# Patient Record
Sex: Male | Born: 1945 | Race: White | Hispanic: No | Marital: Single | Smoking: Former smoker
Health system: Southern US, Community
[De-identification: ages and names within clinical notes are randomized; demographics above are authoritative.]

---
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
## 2008-01-06 ENCOUNTER — Ambulatory Visit: Payer: Self-pay | Admitting: Cardiology

## 2008-01-06 ENCOUNTER — Inpatient Hospital Stay (HOSPITAL_COMMUNITY): Admission: EM | Admit: 2008-01-06 | Discharge: 2008-01-08 | Payer: Self-pay | Admitting: Emergency Medicine

## 2008-01-08 ENCOUNTER — Encounter (INDEPENDENT_AMBULATORY_CARE_PROVIDER_SITE_OTHER): Payer: Self-pay | Admitting: *Deleted

## 2010-03-13 ENCOUNTER — Encounter: Payer: Self-pay | Admitting: *Deleted

## 2010-07-05 NOTE — H&P (Signed)
NAMEPIPER, Wayne Griffin NO.:  [id number]   MEDICAL RECORD NO.:  [id number]          PATIENT TYPE:  INP   LOCATION:  1825                         FACILITY:  [facility]   PHYSICIAN:  Lonia Blood, M.D.       DATE OF BIRTH:  Nov 30, 1945   DATE OF ADMISSION:  01/06/2008  DATE OF DISCHARGE:                              HISTORY & PHYSICAL   PRIMARY CARE PHYSICIAN:  This patient does not have a primary care  physician.   CHIEF COMPLAINT:  Left side pain.   HISTORY OF PRESENT ILLNESS:  Wayne Griffin is 65 year old gentleman  without any past medical history who presents today to the emergency  room after had sudden onset left-sided chest pain that started while he  was doing the dishes.  The patient arrived to emergency room had some  relief but then slamming the left.  Flank dull pain.  He says the chest  and subsided completed by now part of this left flank pain is still  there present right now is a speak.   PAST MEDICAL HISTORY:  Nephrolithiasis appendectomy and osteoarthritis.   SOCIAL HISTORY:  No cigarettes.  The patient quit in February 2002.  No  alcohol and no cocaine.  He lives with his son.   FAMILY HISTORY:  Mother died with colon cancer.  Father died with  dementia.  He has got 3 brothers and 1 sister with obesity, stroke,  hypertension, and diabetes.   HOME MEDICATIONS:  None.   REVIEW OF SYSTEMS:  As per HPI.  All other systems reviewed and  negative.   PHYSICAL EXAMINATION UPON ADMISSION:  VITAL SIGNS:  Temperature 98.2,  pulse 65, respirations 12, and blood pressure 109/59.  GENERAL APPEARANCE:  One of the mildly obese gentleman, in no acute  distress, lying on the stretcher alert and orient to place, person, and  time.  HEAD:  Normocephalic and atraumatic.  EYES:  Pupils equal, round, and reactive to light and accommodation.  Extraocular movements intact.  ENT:  Throat clear.  NECK:  Supple.  No JVD.  CHEST:  Clear to auscultation without wheezing,  rhonchi, or crackles.  HEART:  Regular rate and rhythm without murmurs, rubs, or gallops.  ABDOMEN:  Obese, soft, and nontender.  Bowel sounds are present.  EXTREMITIES:  Lower extremities without edema.  SKIN:  On the left forearm, there is a patch of erythema.   LABORATORY VALUES ON ADMISSION:  White blood cell count is 11.4,  hemoglobin 14.6, and platelet count 165.  Sodium 138, potassium 3.8,  chloride 107, BUN 19, creatinine 1.2, and glucose 232.  CT scan of chest  does not indicate any pulmonary emboli and no parenchymal findings.  CT  scan of abdomen indicates presence of left-sided adrenal hematoma and  questionable adrenal mass.   ASSESSMENT AND PLAN:  1. This is a 65 year old gentleman presenting with chest pain.  He has      got risk factors for coronary artery disease including sex, age,      and presumed diabetes.  I will place the patient on telemetry, rule  him out for myocardial infarction with recent cardiac enzymes.  I      will consider a cardiac stress test in the morning.  2. Adrenal hematoma, unclear etiology.  There is no reported trauma.      Concern about possible malignancy, primary malignancy, or secondary      malignancy.  I think we are going to run coagulation studies and      then consider MRI of the abdomen.  3. Hyperglycemia.  We will check a hemoglobin A1c and based on the      results, we will counsel for possible diabetes.      Lonia Blood, M.D.  Electronically Signed     SL/MEDQ  D:  01/06/2008  T:  01/06/2008  Job:  [id number]

## 2010-07-08 NOTE — Discharge Summary (Signed)
NAMETANNAR, BROKER                ACCOUNT NO.:  [id number]   MEDICAL RECORD NO.:  [id number]          PATIENT TYPE:  INP   LOCATION:  4712                         FACILITY:  [facility]   PHYSICIAN:  Michelene Gardener, MD    DATE OF BIRTH:  10-30-1945   DATE OF ADMISSION:  01/06/2008  DATE OF DISCHARGE:  01/08/2008                               DISCHARGE SUMMARY   DISCHARGE DIAGNOSES:  1. Noncardiac chest pain.  2. Adrenal hematoma, unclear etiology, questionable for malignant      process.  3. Hyperglycemia.  4. Obesity.  5. Osteoarthritis.   DISCHARGE MEDICATIONS:  None.   PROCEDURES:  None.   CONSULTATIONS:  None.   RADIOLOGY STUDIES:  1. Chest x-ray on January 06, 2008 showed some atelectasis.  2. Abdominal x-ray on January 06, 2008 showed nonspecific gas      pattern.  3. CT angio of the abdomen on January 06, 2008 showed left      retroperitoneal hematoma without active extravasation, which seems      to be an enlarged left adrenal gland.  There is nontraumatic      adrenal hemorrhage.  4. CT scan of the chest showed no aortic dissection and showed 8-mm      nodular opacity in the lingula that looks like scar.  5. CT scan of the pelvis showed no acute findings.  Follow up with      primary doctor within 1-2 weeks.  Pending test MRI that was      scheduled for January 15, 2008 to be done as an outpatient.   COURSE OF HOSPITALIZATION:  1. Chest pain.  This patient's pain is most likely cardiac and it is      tender on palpation.  The patient was admitted to the hospital for      risk stratification.  Three sets of troponin and cardiac enzymes      were done and they came to be negative.  EKG was done and that can      be normal.  The patient's chest pain totally resolved.  I advised      him to follow with his primary doctor for outpatient evaluation for      possible stress test, also advised him to come back to the ER if he      developed more chest pain or increasing  shortness of breath.  2. Adrenal hemorrhage.  His CT scan of the abdomen showed enlargement      of the left adrenal with possible hemorrhage and MRI was      recommended.  MRI was tried in the hospital, but was not able to do      it because of his large size.  We tried to do an open MRI, but      nothing has been available up to January 15, 2008.  The patient      already scheduled for January 15, 2008 to undergo the MRI.  I have      prolonged discussion with him, explained the condition to him and  explained the importance of keeping that MRI and then following      with his primary doctor for further evaluation.  3. Hyperglycemia that has resolved.  Hemoglobin A1c was done and it      was 6.1.  No need for medications.  4. Morbid obesity.  The patient was advised about the risks.   Total assessment time is 40 minutes.      Michelene Gardener, MD  Electronically Signed     NAE/MEDQ  D:  01/10/2008  T:  01/11/2008  Job:  [id number]

## 2010-11-23 LAB — COMPREHENSIVE METABOLIC PANEL
ALT: 25
AST: 20
Alkaline Phosphatase: 43
BUN: 19
GFR calc Af Amer: >60
GFR calc non Af Amer: >60
Glucose, Bld: 232 — ABNORMAL HIGH
Potassium: 3.8
Total Protein: 6.5

## 2010-11-23 LAB — DIFFERENTIAL
Basophils Relative: 1
Eosinophils Absolute: 0.3
Eosinophils Relative: 2
Lymphocytes Relative: 38
Lymphs Abs: 4.3 — ABNORMAL HIGH
Monocytes Absolute: 0.9
Monocytes Relative: 8
Neutro Abs: 5.8

## 2010-11-23 LAB — CBC
HCT: 37.6 — ABNORMAL LOW
HCT: 39.2
Hemoglobin: 13.4
MCHC: 35
MCV: 84.1
MCV: 85.8
Platelets: 115 — ABNORMAL LOW
Platelets: 130 — ABNORMAL LOW
RDW: 13.6
RDW: 13.7

## 2010-11-23 LAB — BASIC METABOLIC PANEL
BUN: 14
BUN: 16
Calcium: 8.7
Calcium: 8.8
Creatinine, Ser: 1.04
GFR calc non Af Amer: >60
GFR calc non Af Amer: >60
Glucose, Bld: 158 — ABNORMAL HIGH
Potassium: 3.9

## 2010-11-23 LAB — URINALYSIS, ROUTINE W REFLEX MICROSCOPIC
Glucose, UA: NEGATIVE
Specific Gravity, Urine: 1.011
Urobilinogen, UA: 0.2

## 2010-11-23 LAB — CARDIAC PANEL(CRET KIN+CKTOT+MB+TROPI)
CK, MB: 1
CK, MB: 1.1
CK, MB: 1.1
CK, MB: 1.1
CK, MB: 1.1
Relative Index: 0.9
Relative Index: INVALID
Relative Index: INVALID
Total CK: 123
Total CK: 45
Total CK: 53
Total CK: 75
Troponin I: 0.01
Troponin I: 0.01
Troponin I: <0.01
Troponin I: <0.01

## 2010-11-23 LAB — HEMOGLOBIN A1C: Mean Plasma Glucose: 128

## 2010-11-23 LAB — LIPASE, BLOOD: Lipase: 34

## 2010-11-23 LAB — PROTIME-INR: Prothrombin Time: 12.6

## 2010-11-23 LAB — CORTISOL: Cortisol, Plasma: 18.3

## 2010-11-23 LAB — CK TOTAL AND CKMB (NOT AT ARMC): Total CK: 75

## 2010-11-23 LAB — D-DIMER, QUANTITATIVE
D-Dimer, Quant: 0.51 — ABNORMAL HIGH
D-Dimer, Quant: 1.35 — ABNORMAL HIGH

## 2010-11-23 LAB — LIPID PANEL: VLDL: 49 — ABNORMAL HIGH

## 2010-11-23 LAB — APTT: aPTT: 29

## 2011-02-20 DIAGNOSIS — R0602 Shortness of breath: Secondary | ICD-10-CM

## 2018-01-24 ENCOUNTER — Inpatient Hospital Stay (HOSPITAL_COMMUNITY)
Admission: EM | Admit: 2018-01-24 | Discharge: 2018-01-28 | DRG: 062 | Disposition: A | Discharge status: Rehabilitation Facility | Payer: Medicare Other | Attending: Neurology | Admitting: Neurology

## 2018-01-24 ENCOUNTER — Encounter (HOSPITAL_COMMUNITY): Payer: Self-pay | Admitting: Neurology

## 2018-01-24 ENCOUNTER — Emergency Department (HOSPITAL_COMMUNITY): Payer: Medicare Other

## 2018-01-24 ENCOUNTER — Other Ambulatory Visit: Payer: Self-pay

## 2018-01-24 DIAGNOSIS — E669 Obesity, unspecified: Secondary | ICD-10-CM | POA: Diagnosis present

## 2018-01-24 DIAGNOSIS — M79605 Pain in left leg: Secondary | ICD-10-CM | POA: Diagnosis present

## 2018-01-24 DIAGNOSIS — I69354 Hemiplegia and hemiparesis following cerebral infarction affecting left non-dominant side: Secondary | ICD-10-CM | POA: Diagnosis not present

## 2018-01-24 DIAGNOSIS — I361 Nonrheumatic tricuspid (valve) insufficiency: Secondary | ICD-10-CM | POA: Diagnosis not present

## 2018-01-24 DIAGNOSIS — I6522 Occlusion and stenosis of left carotid artery: Secondary | ICD-10-CM | POA: Diagnosis present

## 2018-01-24 DIAGNOSIS — I739 Peripheral vascular disease, unspecified: Secondary | ICD-10-CM | POA: Diagnosis present

## 2018-01-24 DIAGNOSIS — I878 Other specified disorders of veins: Secondary | ICD-10-CM | POA: Diagnosis present

## 2018-01-24 DIAGNOSIS — N182 Chronic kidney disease, stage 2 (mild): Secondary | ICD-10-CM | POA: Diagnosis present

## 2018-01-24 DIAGNOSIS — E785 Hyperlipidemia, unspecified: Secondary | ICD-10-CM | POA: Diagnosis present

## 2018-01-24 DIAGNOSIS — I639 Cerebral infarction, unspecified: Secondary | ICD-10-CM | POA: Diagnosis present

## 2018-01-24 DIAGNOSIS — R29708 NIHSS score 8: Secondary | ICD-10-CM | POA: Diagnosis present

## 2018-01-24 DIAGNOSIS — Z79899 Other long term (current) drug therapy: Secondary | ICD-10-CM | POA: Diagnosis not present

## 2018-01-24 DIAGNOSIS — I672 Cerebral atherosclerosis: Secondary | ICD-10-CM | POA: Diagnosis present

## 2018-01-24 DIAGNOSIS — G8194 Hemiplegia, unspecified affecting left nondominant side: Secondary | ICD-10-CM | POA: Diagnosis present

## 2018-01-24 DIAGNOSIS — I700 Atherosclerosis of aorta: Secondary | ICD-10-CM | POA: Diagnosis present

## 2018-01-24 DIAGNOSIS — M1120 Other chondrocalcinosis, unspecified site: Secondary | ICD-10-CM | POA: Diagnosis present

## 2018-01-24 DIAGNOSIS — Z23 Encounter for immunization: Secondary | ICD-10-CM | POA: Diagnosis present

## 2018-01-24 DIAGNOSIS — M25552 Pain in left hip: Secondary | ICD-10-CM | POA: Diagnosis not present

## 2018-01-24 DIAGNOSIS — Z823 Family history of stroke: Secondary | ICD-10-CM | POA: Diagnosis not present

## 2018-01-24 DIAGNOSIS — R609 Edema, unspecified: Secondary | ICD-10-CM

## 2018-01-24 DIAGNOSIS — Z87891 Personal history of nicotine dependence: Secondary | ICD-10-CM

## 2018-01-24 DIAGNOSIS — D72829 Elevated white blood cell count, unspecified: Secondary | ICD-10-CM | POA: Diagnosis not present

## 2018-01-24 DIAGNOSIS — D62 Acute posthemorrhagic anemia: Secondary | ICD-10-CM | POA: Diagnosis not present

## 2018-01-24 DIAGNOSIS — Z7982 Long term (current) use of aspirin: Secondary | ICD-10-CM

## 2018-01-24 DIAGNOSIS — R296 Repeated falls: Secondary | ICD-10-CM | POA: Diagnosis present

## 2018-01-24 DIAGNOSIS — R0989 Other specified symptoms and signs involving the circulatory and respiratory systems: Secondary | ICD-10-CM | POA: Diagnosis not present

## 2018-01-24 DIAGNOSIS — N179 Acute kidney failure, unspecified: Secondary | ICD-10-CM | POA: Diagnosis not present

## 2018-01-24 DIAGNOSIS — Z8249 Family history of ischemic heart disease and other diseases of the circulatory system: Secondary | ICD-10-CM | POA: Diagnosis not present

## 2018-01-24 DIAGNOSIS — F419 Anxiety disorder, unspecified: Secondary | ICD-10-CM | POA: Diagnosis not present

## 2018-01-24 DIAGNOSIS — I370 Nonrheumatic pulmonary valve stenosis: Secondary | ICD-10-CM | POA: Diagnosis not present

## 2018-01-24 DIAGNOSIS — W19XXXA Unspecified fall, initial encounter: Secondary | ICD-10-CM | POA: Diagnosis not present

## 2018-01-24 DIAGNOSIS — I10 Essential (primary) hypertension: Secondary | ICD-10-CM | POA: Diagnosis not present

## 2018-01-24 DIAGNOSIS — M1732 Unilateral post-traumatic osteoarthritis, left knee: Secondary | ICD-10-CM | POA: Diagnosis not present

## 2018-01-24 DIAGNOSIS — I6300 Cerebral infarction due to thrombosis of unspecified precerebral artery: Secondary | ICD-10-CM | POA: Diagnosis not present

## 2018-01-24 DIAGNOSIS — Z7902 Long term (current) use of antithrombotics/antiplatelets: Secondary | ICD-10-CM

## 2018-01-24 DIAGNOSIS — R269 Unspecified abnormalities of gait and mobility: Secondary | ICD-10-CM | POA: Diagnosis not present

## 2018-01-24 DIAGNOSIS — I161 Hypertensive emergency: Secondary | ICD-10-CM | POA: Diagnosis present

## 2018-01-24 DIAGNOSIS — I129 Hypertensive chronic kidney disease with stage 1 through stage 4 chronic kidney disease, or unspecified chronic kidney disease: Secondary | ICD-10-CM | POA: Diagnosis present

## 2018-01-24 DIAGNOSIS — R29810 Facial weakness: Secondary | ICD-10-CM | POA: Diagnosis present

## 2018-01-24 DIAGNOSIS — R7989 Other specified abnormal findings of blood chemistry: Secondary | ICD-10-CM | POA: Diagnosis not present

## 2018-01-24 DIAGNOSIS — M47812 Spondylosis without myelopathy or radiculopathy, cervical region: Secondary | ICD-10-CM | POA: Diagnosis present

## 2018-01-24 DIAGNOSIS — I69359 Hemiplegia and hemiparesis following cerebral infarction affecting unspecified side: Secondary | ICD-10-CM | POA: Diagnosis not present

## 2018-01-24 DIAGNOSIS — I69398 Other sequelae of cerebral infarction: Secondary | ICD-10-CM | POA: Diagnosis not present

## 2018-01-24 LAB — DIFFERENTIAL
Abs Immature Granulocytes: 0.08 10*3/uL — ABNORMAL HIGH (ref 0.00–0.07)
Basophils Absolute: 0 10*3/uL (ref 0.0–0.1)
Basophils Relative: 0 %
Eosinophils Absolute: 0 10*3/uL (ref 0.0–0.5)
Eosinophils Relative: 0 %
Immature Granulocytes: 1 %
LYMPHS PCT: 5 %
Lymphs Abs: 0.8 10*3/uL (ref 0.7–4.0)
Monocytes Absolute: 1.5 10*3/uL — ABNORMAL HIGH (ref 0.1–1.0)
Monocytes Relative: 10 %
Neutro Abs: 13.3 10*3/uL — ABNORMAL HIGH (ref 1.7–7.7)
Neutrophils Relative %: 84 %

## 2018-01-24 LAB — CBC
HEMATOCRIT: 46.4 % (ref 39.0–52.0)
HEMOGLOBIN: 14.6 g/dL (ref 13.0–17.0)
MCH: 26.9 pg (ref 26.0–34.0)
MCHC: 31.5 g/dL (ref 30.0–36.0)
MCV: 85.6 fL (ref 80.0–100.0)
NRBC: 0 % (ref 0.0–0.2)
PLATELETS: DECREASED 10*3/uL (ref 150–400)
RBC: 5.42 MIL/uL (ref 4.22–5.81)
RDW: 14.6 % (ref 11.5–15.5)
WBC: 15.7 10*3/uL — ABNORMAL HIGH (ref 4.0–10.5)

## 2018-01-24 LAB — CBG MONITORING, ED: Glucose-Capillary: 156 mg/dL — ABNORMAL HIGH (ref 70–99)

## 2018-01-24 LAB — COMPREHENSIVE METABOLIC PANEL
ALT: 17 U/L (ref 0–44)
AST: 25 U/L (ref 15–41)
Albumin: 3.8 g/dL (ref 3.5–5.0)
Alkaline Phosphatase: 50 U/L (ref 38–126)
Anion gap: 15 (ref 5–15)
BUN: 18 mg/dL (ref 8–23)
CALCIUM: 9.1 mg/dL (ref 8.9–10.3)
CO2: 20 mmol/L — ABNORMAL LOW (ref 22–32)
CREATININE: 1.68 mg/dL — AB (ref 0.61–1.24)
Chloride: 105 mmol/L (ref 98–111)
GFR calc Af Amer: 46 mL/min — ABNORMAL LOW (ref >60)
GFR calc non Af Amer: 40 mL/min — ABNORMAL LOW (ref >60)
Glucose, Bld: 159 mg/dL — ABNORMAL HIGH (ref 70–99)
Potassium: 4 mmol/L (ref 3.5–5.1)
Sodium: 140 mmol/L (ref 135–145)
Total Bilirubin: 1 mg/dL (ref 0.3–1.2)
Total Protein: 7.1 g/dL (ref 6.5–8.1)

## 2018-01-24 LAB — PROTIME-INR
INR: 1.04
Prothrombin Time: 13.5 seconds (ref 11.4–15.2)

## 2018-01-24 LAB — APTT: aPTT: 26 seconds (ref 24–36)

## 2018-01-24 LAB — I-STAT CHEM 8, ED
BUN: 22 mg/dL (ref 8–23)
CREATININE: 1.5 mg/dL — AB (ref 0.61–1.24)
Calcium, Ion: 1.07 mmol/L — ABNORMAL LOW (ref 1.15–1.40)
Chloride: 104 mmol/L (ref 98–111)
Glucose, Bld: 159 mg/dL — ABNORMAL HIGH (ref 70–99)
HCT: 46 % (ref 39.0–52.0)
Hemoglobin: 15.6 g/dL (ref 13.0–17.0)
POTASSIUM: 3.9 mmol/L (ref 3.5–5.1)
Sodium: 140 mmol/L (ref 135–145)
TCO2: 24 mmol/L (ref 22–32)

## 2018-01-24 LAB — I-STAT TROPONIN, ED: Troponin i, poc: 0.05 ng/mL (ref 0.00–0.08)

## 2018-01-24 LAB — MRSA PCR SCREENING: MRSA by PCR: NEGATIVE

## 2018-01-24 MED ORDER — CLEVIDIPINE BUTYRATE 0.5 MG/ML IV EMUL
0.0000 mg/h | INTRAVENOUS | Status: DC
  Administered 2018-01-24 (×2): 4 mg/h via INTRAVENOUS
  Administered 2018-01-24: 1 mg/h via INTRAVENOUS
  Administered 2018-01-25: 3 mg/h via INTRAVENOUS
  Filled 2018-01-24 (×4): qty 50

## 2018-01-24 MED ORDER — PANTOPRAZOLE SODIUM 40 MG IV SOLR
40.0000 mg | Freq: Every day | INTRAVENOUS | Status: DC
  Administered 2018-01-24: 40 mg via INTRAVENOUS
  Filled 2018-01-24: qty 40

## 2018-01-24 MED ORDER — SENNOSIDES-DOCUSATE SODIUM 8.6-50 MG PO TABS: 1.0000 | ORAL_TABLET | Freq: Every evening | ORAL | Status: DC | PRN

## 2018-01-24 MED ORDER — ACETAMINOPHEN 325 MG PO TABS
ORAL_TABLET | ORAL | Status: AC
  Filled 2018-01-24: qty 2

## 2018-01-24 MED ORDER — STROKE: EARLY STAGES OF RECOVERY BOOK
Freq: Once | Status: DC
  Filled 2018-01-24 (×3): qty 1

## 2018-01-24 MED ORDER — IOPAMIDOL (ISOVUE-370) INJECTION 76%
100.0000 mL | Freq: Once | INTRAVENOUS | Status: AC | PRN
  Administered 2018-01-24: 100 mL via INTRAVENOUS

## 2018-01-24 MED ORDER — ACETAMINOPHEN 160 MG/5ML PO SOLN: 650.0000 mg | ORAL | Status: DC | PRN

## 2018-01-24 MED ORDER — ACETAMINOPHEN 650 MG RE SUPP
650.0000 mg | RECTAL | Status: DC | PRN
  Administered 2018-01-24: 650 mg via RECTAL
  Filled 2018-01-24: qty 1

## 2018-01-24 MED ORDER — SODIUM CHLORIDE 0.9 % IV SOLN
Freq: Once | INTRAVENOUS | Status: AC
  Administered 2018-01-24: 10:00:00 via INTRAVENOUS

## 2018-01-24 MED ORDER — LABETALOL HCL 5 MG/ML IV SOLN
20.0000 mg | Freq: Once | INTRAVENOUS | Status: AC
  Administered 2018-01-24: 10 mg via INTRAVENOUS

## 2018-01-24 MED ORDER — ACETAMINOPHEN 325 MG PO TABS
650.0000 mg | ORAL_TABLET | ORAL | Status: DC | PRN
  Administered 2018-01-24 – 2018-01-27 (×5): 650 mg via ORAL
  Filled 2018-01-24 (×5): qty 2

## 2018-01-24 MED ORDER — HYDROMORPHONE HCL 1 MG/ML IJ SOLN
0.5000 mg | Freq: Four times a day (QID) | INTRAMUSCULAR | Status: AC | PRN
  Administered 2018-01-24 – 2018-01-25 (×3): 0.5 mg via INTRAVENOUS
  Filled 2018-01-24 (×3): qty 1

## 2018-01-24 MED ORDER — INFLUENZA VAC SPLIT HIGH-DOSE 0.5 ML IM SUSY
0.5000 mL | PREFILLED_SYRINGE | INTRAMUSCULAR | Status: AC
  Administered 2018-01-25: 0.5 mL via INTRAMUSCULAR
  Filled 2018-01-24: qty 0.5

## 2018-01-24 MED ORDER — CLEVIDIPINE BUTYRATE 0.5 MG/ML IV EMUL
INTRAVENOUS | Status: AC
  Filled 2018-01-24: qty 50

## 2018-01-24 MED ORDER — SODIUM CHLORIDE 0.9 % IV SOLN
INTRAVENOUS | Status: DC
  Administered 2018-01-24 – 2018-01-25 (×2): via INTRAVENOUS

## 2018-01-24 MED ORDER — LABETALOL HCL 5 MG/ML IV SOLN
INTRAVENOUS | Status: AC
  Filled 2018-01-24: qty 4

## 2018-01-24 MED ORDER — ALTEPLASE (STROKE) FULL DOSE INFUSION
90.0000 mg | Freq: Once | INTRAVENOUS | Status: AC
  Administered 2018-01-24: 90 mg via INTRAVENOUS
  Filled 2018-01-24: qty 100

## 2018-01-24 NOTE — Plan of Care (Signed)
  Problem: Clinical Measurements: Goal: Ability to maintain clinical measurements within normal limits will improve Outcome: Progressing   Problem: Elimination: Goal: Will not experience complications related to urinary retention Outcome: Progressing   

## 2018-01-24 NOTE — Evaluation (Signed)
Clinical/Bedside Swallow Evaluation Patient Details  Name: Wayne Griffin Daoust MRN: [id number] Date of Birth: November 17, 1945  Today's Date: 01/24/2018 Time: SLP Start Time (ACUTE ONLY): 1550 SLP Stop Time (ACUTE ONLY): 1603 SLP Time Calculation (min) (ACUTE ONLY): 13 min  Past Medical History: History reviewed. No pertinent past medical history. Past Surgical History: History reviewed. No pertinent surgical history. HPI:  Wayne Griffin Fleece is a 72 y.o. male no pertinent history secondary to not seen PCP in a very long time.  Per son patient had a period of left-sided arm and leg weakness which caused the fall last night but he apparently resolved completely.  Patient went to bed and woke up at approximately 5 AM at this point got out of bed and fell once again but the symptoms did not resolve.  Patient was brought to the ED and left facial droop, left-sided hemiparesis was noted.  Pt was given tPA.  CT imaging negative so far.   Assessment / Plan / Recommendation Clinical Impression  Pt presents with mild-moderate oral dysphagia c/b decreased labial seal and reduce lingual strength and ROM.  These deficts resulted in difficulty retrieving thin liquid by cup and straw, anterior spillage of puree, prolonged mastication and decreased A-P transit, and mild L side pocketing and oral residue with solid.  Soft solid improved efficiency of mastication.  Liquid was was beneficial to improve oral clearance of solids.  Pt tolerated all consistencies trialed without clinical s/s of aspiration.  Recommend mechanical soft diet with thin iliquid with aspiration precautions as listed below. SLP Visit Diagnosis: Dysphagia, oral phase (R13.11)    Aspiration Risk  Mild aspiration risk    Diet Recommendation Dysphagia 3 (Mech soft);Thin liquid   Liquid Administration via: Cup;Straw  Medication Administration: Whole meds with liquid  Supervision: Intermittent supervision to cue for compensatory strategies  Compensations:    Slow rate;  Small sips/bites;  Lingual sweep for clearance of pocketing;  Follow solids with liquid;  Check for pocketing of L side  Postural Changes: Seated upright at 90 degrees    Other  Recommendations Oral Care Recommendations: Oral care BID   Follow up Recommendations        Frequency and Duration min 2x/week  1 week       Prognosis Prognosis for Safe Diet Advancement: Good      Swallow Study   General HPI: Wayne Griffin Lippman is a 72 y.o. male no pertinent history secondary to not seen PCP in a very long time.  Per son patient had a period of left-sided arm and leg weakness which caused the fall last night but he apparently resolved completely.  Patient went to bed and woke up at approximately 5 AM at this point got out of bed and fell once again but the symptoms did not resolve.  Patient was brought to the ED and left facial droop, left-sided hemiparesis was noted.  Pt was given tPA.  CT imaging negative so far. Type of Study: Bedside Swallow Evaluation Diet Prior to this Study: NPO History of Recent Intubation: No Behavior/Cognition: Alert;Cooperative;Pleasant mood Oral Cavity Assessment: Within Functional Limits Oral Care Completed by SLP: No Oral Cavity - Dentition: Adequate natural dentition;Missing dentition Patient Positioning: Upright in bed Baseline Vocal Quality: Normal Volitional Cough: (Fair) Volitional Swallow: Able to elicit    Oral/Motor/Sensory Function Overall Oral Motor/Sensory Function: Moderate impairment Facial ROM: Reduced left Facial Symmetry: Abnormal symmetry left Facial Strength: Reduced left Facial Sensation: Within Functional Limits Lingual ROM: Reduced right;Reduced left Lingual Symmetry: Within Functional Limits Lingual  Strength: Reduced Velum: Within Functional Limits Mandible: Within Functional Limits   Ice Chips Ice chips: Not tested   Thin Liquid Thin Liquid: Impaired Oral Phase Impairments: Reduced labial seal    Nectar  Thick Nectar Thick Liquid: Not tested   Honey Thick Honey Thick Liquid: Not tested   Puree Puree: Impaired Presentation: Spoon Oral Phase Impairments: Reduced labial seal Oral Phase Functional Implications: Left anterior spillage;Left lateral sulci pocketing   Solid     Solid: Impaired Presentation: Spoon;Self Fed Oral Phase Functional Implications: Prolonged oral transit;Left lateral sulci pocketing;Oral residue      Kerrie Pleasure, MA, CCC-SLP Acute Rehabilitation Services Office: [phone] 01/24/2018,4:15 PM

## 2018-01-24 NOTE — ED Provider Notes (Signed)
[facility]  EMERGENCY DEPARTMENT Provider Note   CSN: [id number] Arrival date & time: 01/24/18  0830     History   Chief Complaint No chief complaint on file.   HPI Wayne Griffin is a 72 y.o. male.  Patient is a 72 year old male brought by EMS over concerns of stroke.  Since yesterday evening, he is experienced multiple episodes of intermittent weakness to his left side.  This is caused him to fall.  It recurred this morning and the patient was transported here by EMS for evaluation.  He denies any fevers or chills.  He denies any headache or visual disturbances.  The history is provided by the patient.    No past medical history on file.  Patient Active Problem List   Diagnosis Date Noted  . Stroke (HCC) 01/24/2018          Home Medications    Prior to Admission medications   Not on File    Family History No family history on file.  Social History Social History   Tobacco Use  . Smoking status: Not on file  Substance Use Topics  . Alcohol use: Not on file  . Drug use: Not on file     Allergies   Patient has no allergy information on record.   Review of Systems Review of Systems  All other systems reviewed and are negative.    Physical Exam Updated Vital Signs Wt 110 kg   Physical Exam  Constitutional: He is oriented to person, place, and time. He appears well-developed and well-nourished. No distress.  HENT:  Head: Normocephalic and atraumatic.  Mouth/Throat: Oropharynx is clear and moist.  Neck: Normal range of motion. Neck supple.  Cardiovascular: Normal rate and regular rhythm. Exam reveals no friction rub.  No murmur heard. Pulmonary/Chest: Effort normal and breath sounds normal. No respiratory distress. He has no wheezes. He has no rales.  Abdominal: Soft. Bowel sounds are normal. He exhibits no distension. There is no tenderness.  Musculoskeletal: Normal range of motion. He exhibits no edema.  Neurological: He is  alert and oriented to person, place, and time. No cranial nerve deficit or sensory deficit. He exhibits abnormal muscle tone. Coordination normal.  There is a left-sided hemiparesis noted involving the left arm and left leg.  Skin: Skin is warm and dry. He is not diaphoretic.  Nursing note and vitals reviewed.    ED Treatments / Results  Labs (all labs ordered are listed, but only abnormal results are displayed) Labs Reviewed  CBC - Abnormal; Notable for the following components:      Result Value   WBC 15.7 (*)    All other components within normal limits  CBG MONITORING, ED - Abnormal; Notable for the following components:   Glucose-Capillary 156 (*)    All other components within normal limits  I-STAT CHEM 8, ED - Abnormal; Notable for the following components:   Creatinine, Ser 1.50 (*)    Glucose, Bld 159 (*)    Calcium, Ion 1.07 (*)    All other components within normal limits  PROTIME-INR  APTT  DIFFERENTIAL  COMPREHENSIVE METABOLIC PANEL  I-STAT TROPONIN, ED    EKG None  Radiology Ct Head Code Stroke Wo Contrast  Result Date: 01/24/2018 CLINICAL DATA:  Code stroke.  Left-sided weakness.  Code stroke. EXAM: CT HEAD WITHOUT CONTRAST TECHNIQUE: Contiguous axial images were obtained from the base of the skull through the vertex without intravenous contrast. COMPARISON:  None. FINDINGS: Brain: Age related atrophy.  Mild chronic small-vessel ischemic change of the hemispheric white matter. No sign of acute parenchymal infarction. Old small vessel infarctions affect the thalami, basal ganglia and external capsule regions. No mass, hemorrhage, hydrocephalus or extra-axial collection. Vascular: There is atherosclerotic calcification of the major vessels at the base of the brain. 1 could question a small hyperdense focus in the right MCA bifurcation region. This is certainly not definite evidence acute embolic disease. Skull: Normal Sinuses/Orbits: Clear/normal Other: None ASPECTS  (Alberta Stroke Program Early CT Score) - Ganglionic level infarction (caudate, lentiform nuclei, internal capsule, insula, M1-M3 cortex): 7 - Supraganglionic infarction (M4-M6 cortex): 3 Total score (0-10 with 10 being normal): 10 IMPRESSION: 1. No acute parenchymal finding. Chronic small-vessel ischemic changes. 2. ASPECTS is 10. 3. One could question a focal hyperdensity at the right MCA bifurcation, but this is not definite. 4. This result was communicated by pager to the stroke service. Electronically Signed   By: Paulina FusiMark  Shogry M.D.   On: 01/24/2018 08:50    Procedures Procedures (including critical care time)  Medications Ordered in ED Medications   stroke: mapping our early stages of recovery book (has no administration in time range)  0.9 %  sodium chloride infusion (has no administration in time range)  acetaminophen (TYLENOL) tablet 650 mg (has no administration in time range)    Or  acetaminophen (TYLENOL) solution 650 mg (has no administration in time range)    Or  acetaminophen (TYLENOL) suppository 650 mg (has no administration in time range)  senna-docusate (Senokot-S) tablet 1 tablet (has no administration in time range)  pantoprazole (PROTONIX) injection 40 mg (has no administration in time range)  labetalol (NORMODYNE,TRANDATE) injection 20 mg (has no administration in time range)    And  clevidipine (CLEVIPREX) infusion 0.5 mg/mL (has no administration in time range)  alteplase (ACTIVASE) 1 mg/mL infusion 90 mg (has no administration in time range)     Initial Impression / Assessment and Plan / ED Course  I have reviewed the triage vital signs and the nursing notes.  Pertinent labs & imaging results that were available during my care of the patient were reviewed by me and considered in my medical decision making (see chart for details).  Patient brought here for left-sided weakness thought to be related to an acute CVA.  Patient was brought as a code stroke and was  immediately evaluated by myself and neurology upon arrival.  Patient went for imaging studies and laboratory studies were obtained.  Results show questionable hyperdensity in the right MCA bifurcation.    Patient was felt by neurology to meet criteria for thrombolysis.  Patient will be admitted to the neurology service for further treatment and work-up.  CRITICAL CARE Performed by: Geoffery Lyonsouglas Makhya Arave Total critical care time: 35 minutes Critical care time was exclusive of separately billable procedures and treating other patients. Critical care was necessary to treat or prevent imminent or life-threatening deterioration. Critical care was time spent personally by me on the following activities: development of treatment plan with patient and/or surrogate as well as nursing, discussions with consultants, evaluation of patient's response to treatment, examination of patient, obtaining history from patient or surrogate, ordering and performing treatments and interventions, ordering and review of laboratory studies, ordering and review of radiographic studies, pulse oximetry and re-evaluation of patient's condition.   Final Clinical Impressions(s) / ED Diagnoses   Final diagnoses:  None    ED Discharge Orders    None       Geoffery Lyonselo, Maxxon Schwanke, MD 01/24/18  0915  

## 2018-01-24 NOTE — Code Documentation (Signed)
72 yo male coming from home where he lives with his son with complaints of left sided facial droop and left arm weakness that started this morning. Pt had a fall last night where he was noted to have left sided weakness and left facial droop. The episode lasted fifteen minutes and the patient fully resolved. Spent the rest of the night at his baseline. Pt woke up this morning at his baseline at 0500. He ate breakfast with his son. After breakfast, pt was noted to have a fall again and left facial droop along with left arm and leg weakness noted. EMS was called. EMS activated a Code Stroke. Stroke Team met patient upon arrival to the ED. Initial NIHSS 10 due to left facial droop, left arm weakness, left leg weakness, and dysarthria. CT Head negative for hemorrhage. BP noted to be out of parameter. 10 mg of Labetalol given. tPA started at 0848 81 mg/hr with 9 mg bolus over 1 minute after BP dropped to 151/83. CTA/CTP completed with no signs of LVO. Pt brought back to Trauma A. BP noted to rise. Cleviprex started per MD order. Titrated per protocol to control BP. Placed on cardiac monitor. Pt alert and oriented x4. No change in exam at this time. Handoff given to Vicente Males, Therapist, sports. Pt to be admitted to [place].

## 2018-01-24 NOTE — Progress Notes (Signed)
Pharmacist Code Stroke Response  Notified to mix tPA at 0843 by Neuro Delivered tPA to RN at 0847  tPA dose = 9 mg bolus over 1 minute followed by 81 mg for a total dose of 90 mg over 1 hour  Issues/delays encountered (if applicable): N/A  Wayne Griffin, Wayne Griffin 01/24/18 8:52 AM

## 2018-01-24 NOTE — H&P (Signed)
Neurology history and physical    CC: Left hemi-paresis and facial droop  History is obtained from: Son and EMS  HPI: Wayne Griffin is a 72 y.o. male no pertinent history secondary to not seen PCP in a very long time.  Per son patient had a period of left-sided arm and leg weakness which caused the fall last night but he apparently resolved completely.  Patient went to bed and woke up at approximately 5 AM at this point got out of bed and fell once again but the symptoms did not resolve.  Patient was brought to the ED and left facial droop, left-sided hemiparesis was noted.  Decision to give TPA was made and patient agreed.  In addition CTA of head and neck was obtained and did not show any large vessel occlusion.  CT perfusion was also obtained.  ED course : Patient immediately brought to CT scan, obtain CTA of neck and brain.  This was then followed by perfusion scan  LKW: 0 500 on 01/24/2018 tpa given?:  Yes Premorbid modified Rankin scale (mRS): 0 NIH stroke score 8   ROS: ROS was performed and is negative except as noted in the HPI.    No past medical history on file.  Secondary to not seeing PCP   No family history on file. No family history of strokes or heart attacks.  Social History:   has no tobacco, alcohol, and drug history on file.  Medications  Current Facility-Administered Medications:  .   stroke: mapping our early stages of recovery book, , Does not apply, Once, Marliss Coots, PA-C .  0.9 %  sodium chloride infusion, , Intravenous, Continuous, Marliss Coots, PA-C .  acetaminophen (TYLENOL) tablet 650 mg, 650 mg, Oral, Q4H PRN **OR** acetaminophen (TYLENOL) solution 650 mg, 650 mg, Per Tube, Q4H PRN **OR** acetaminophen (TYLENOL) suppository 650 mg, 650 mg, Rectal, Q4H PRN, Marliss Coots, PA-C .  alteplase (ACTIVASE) 1 mg/mL infusion 90 mg, 90 mg, Intravenous, Once, Amie Portland, MD .  labetalol (NORMODYNE,TRANDATE) injection 20 mg, 20 mg, Intravenous, Once  **AND** clevidipine (CLEVIPREX) infusion 0.5 mg/mL, 0-21 mg/hr, Intravenous, Continuous, Marliss Coots, PA-C .  pantoprazole (PROTONIX) injection 40 mg, 40 mg, Intravenous, QHS, Marliss Coots, PA-C .  senna-docusate (Senokot-S) tablet 1 tablet, 1 tablet, Oral, QHS PRN, Marliss Coots, PA-C No current outpatient medications on file.   Exam: Current vital signs: Wt 110 kg  Vital signs in last 24 hours: Weight:  [110 kg] 110 kg (12/05 0800)  Physical Exam  Constitutional: Appears well-developed and well-nourished.  Psych: Affect appropriate to situation Eyes: No scleral injection HENT: No OP obstrucion Head: Normocephalic.  Cardiovascular: Normal rate and regular rhythm.  Respiratory: Effort normal, non-labored breathing GI: Soft.  No distension. There is no tenderness.  Skin: WDI  Neuro: Mental Status: Patient is awake, alert, oriented to person, place, month, year, and situation. Patient is able to give a clear and coherent history. No signs of aphasia or neglect Cranial Nerves: II: Visual Fields are full. Pupils are equal, round, and reactive to light.   III,IV, VI: EOMI without ptosis or diploplia.  V: Facial sensation is symmetric to temperature  VII: Facial droop VIII: hearing is intact to voice X: Uvula elevates symmetrically XI: Shoulder shrug is symmetric. XII: tongue is midline without atrophy or fasciculations.  Motor: Left upper was 0/5, left lower was 4-/5, right was 5/5 in upper and lower Sensory: Initially reported some decreased sensation on the left but  later said that sensation seems symmetric to light touch and temperature on both sides of the body including the face Deep Tendon Reflexes: 1+ biceps, unable to elicit ankle or knee jerks bilaterally. Plantars: Left toe upgoing, right toe downgoing Cerebellar: Intact finger-nose-finger  Labs I have reviewed labs in epic and the results pertinent to this consultation are:  CBC    Component Value  Date/Time   WBC 8.4 01/08/2008 0435   RBC 4.57 01/08/2008 0435   HGB 15.6 01/24/2018 0840   HCT 46.0 01/24/2018 0840   PLT 130 (L) 01/08/2008 0435   MCV 85.8 01/08/2008 0435   MCHC 34.1 01/08/2008 0435   RDW 13.6 01/08/2008 0435   LYMPHSABS 4.3 (H) 01/06/2008 0150   MONOABS 0.9 01/06/2008 0150   EOSABS 0.3 01/06/2008 0150   BASOSABS 0.1 01/06/2008 0150    CMP     Component Value Date/Time   NA 140 01/24/2018 0840   K 3.9 01/24/2018 0840   CL 104 01/24/2018 0840   CO2 28 01/08/2008 0435   GLUCOSE 159 (H) 01/24/2018 0840   BUN 22 01/24/2018 0840   CREATININE 1.50 (H) 01/24/2018 0840   CALCIUM 8.8 01/08/2008 0435   PROT 6.5 01/06/2008 0150   ALBUMIN 3.6 01/06/2008 0150   AST 20 01/06/2008 0150   ALT 25 01/06/2008 0150   ALKPHOS 43 01/06/2008 0150   BILITOT 0.6 01/06/2008 0150   GFRNONAA >60 01/08/2008 0435   GFRAA  01/08/2008 0435    >60        The eGFR has been calculated using the MDRD equation. This calculation has not been validated in all clinical     Etta Quill PA-C [facility] [phone]  M-F  (9:00 am- 5:00 PM)  01/24/2018, 8:52 AM    Attending addendum I have seen and examined the patient is an acute code stroke. Subjective: Briefly, 72 year old with no significant past medical history was not seen a doctor in many years of his usual state of health until last night when he had sudden onset of left-sided weakness that resolved in 15 minutes.  He went to bed normally.  Woke up in the morning, having breakfast with his son when he got up and went to the bathroom and noted that he could not move the left side of his body.  The symptoms did not resolve this time.  His last known normal was 5 AM on 01/24/2018.  He was brought in to the emergency room by EMS. Seen on the bridge.  NIH stroke scale 8. Review of systems essentially negative with the exception of the weakness yesterday. Objective: On examination, awake alert oriented x3, naming,  attention repetition intact, speech dysarthric, cranial nerve examination he has equal pupils round and reactive to light, no restriction of extra ocular movements, visual fields are full, he has a left lower facial droop, hearing is mildly impaired bilaterally, tongue is midline.  Motor exam shows complete flaccid paralysis of the left upper extremity, 3/5 to 4/5 left lower extremity, 5/5 strength in the right upper extremity, 5/5 strength in the right lower extremity.  Initially reported some decreased sensation to light touch on the left but later said that the sensation is symmetric.  No dysmetria noted.  No extinction. 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  No evidence of bleed. CTA head and neck shows no large vessel occlusion and CT perfusion  study did not show any perfusion mismatch. 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  20% stenosis of the left ICA bulb.  Intracranial atherosclerotic irregularity bilaterally with 50% stenosis of the proximal right M2.  Risks and benefits of IV TPA were discussed with him and explained in detail.  He agreed to proceed with the IV TPA. His systolic blood pressures were higher than goal for IV TPA.  He required 1 dose of labetalol before the infusion of TPA could be started. As the TPA was being infused, his blood pressures again jumped into 200s requiring initiation of Cleviprex.  Required another dose of labetalol 20 mg to get his blood pressures to goal.   Assessment:  72 year old male presenting to the hospital with left facial droop and left-sided hemiparesis.  Likely small vessel ischemic stroke.  Patient did receive TPA as he was in the window  Plan:  Acuity: Acute Current Suspected Etiology: Small vessel penetrating arteries of  lenticulostriate lacune -Admit to: ICU -Start statin -Hold Aspirin until 24 hour post tPA neuroimaging is stable and without evidence of bleeding -Blood pressure control, goal 180/105 or below -MRI/ECHO/A1C/Lipid panel. -Hyperglycemia management per SSI to maintain glucose 140-123m/dL. -PT/OT/ST therapies and recommendations when able  CNS -Close neuro monitoring  Dysarthria -NPO until cleared by speech -ST -Advance diet as tolerated  Hemiplegia and hemiparesis following cerebral infarction affecting left non-dominant side  -PT/OT -PMR consult  RESP Possible aspiration pneumonia Chest x-ray Stable clinically-breathing and saturating normally on room air.   CV  Hypertensive emergency Essential (primary) hypertension -Aggressive BP control as above -Cleviprex drip as needed.  Use labetalol as needed. -2D echo  Hyperlipidemia, unspecified  - Statin for goal LDL < 70  Normal sinus rhythm   HEME No active issues -transfuse for hgb < 7  ENDO To be evaluated while in hospital as he has not seen a PCP -goal HgbA1c < 7  GI/GU No active issues Gentle hydration-normal saline 75 cc an hour  Fluid/Electrolyte Disorders Monitor labs Replete as necessary   Prophylaxis DVT: SCD GI: Senokot for obstipation  Diet: NPO until cleared by speech  Code Status: Full Code     THE FOLLOWING WERE PRESENT ON ADMISSION: CNS -  Acute Ischemic Stroke,Hemiparesis CVS: Hypertension, hypertensive emergency Respiratory: Possible aspiration pneumonia   Minor delays due to severely elevated blood pressure prior to initiating TPA.    CRITICAL CARE ATTESTATION Performed by: AAmie Portland MD Total critical care time: 60 minutes Critical care time was exclusive of separately billable procedures and treating other patients and/or supervising APPs/Residents/Students Critical care was necessary to treat or prevent imminent or life-threatening deterioration due to acute ischemic  stroke, hypertensive emergency. This patient is critically ill and at significant risk for neurological worsening and/or death and care requires constant monitoring. Critical care was time spent personally by me on the following activities: development of treatment plan with patient and/or surrogate as well as nursing, discussions with consultants, evaluation of patient's response to treatment, examination of patient, obtaining history from patient or surrogate, ordering and performing treatments and interventions, ordering and review of laboratory studies, ordering and review of radiographic studies, pulse oximetry, re-evaluation of patient's condition, participation in multidisciplinary rounds and medical decision making of high complexity in the care of this patient.

## 2018-01-24 NOTE — ED Triage Notes (Signed)
Pt in from home via [facility] as code stroke with L side face droop and L arm weakness. Per family, pt's LSN was waking at 0500. Had fall yesterday per family, resulting in L side pain/weakness but symptoms had resolved prior to bedtime. + L leg drift, a&ox4, speech seems slurred

## 2018-01-25 ENCOUNTER — Inpatient Hospital Stay (HOSPITAL_COMMUNITY): Payer: Medicare Other

## 2018-01-25 DIAGNOSIS — I370 Nonrheumatic pulmonary valve stenosis: Secondary | ICD-10-CM

## 2018-01-25 DIAGNOSIS — M25552 Pain in left hip: Secondary | ICD-10-CM

## 2018-01-25 DIAGNOSIS — D72829 Elevated white blood cell count, unspecified: Secondary | ICD-10-CM

## 2018-01-25 DIAGNOSIS — R7989 Other specified abnormal findings of blood chemistry: Secondary | ICD-10-CM

## 2018-01-25 DIAGNOSIS — I361 Nonrheumatic tricuspid (valve) insufficiency: Secondary | ICD-10-CM

## 2018-01-25 DIAGNOSIS — I10 Essential (primary) hypertension: Secondary | ICD-10-CM

## 2018-01-25 DIAGNOSIS — E785 Hyperlipidemia, unspecified: Secondary | ICD-10-CM

## 2018-01-25 LAB — CBC
HCT: 42.6 % (ref 39.0–52.0)
Hemoglobin: 14 g/dL (ref 13.0–17.0)
MCH: 27.5 pg (ref 26.0–34.0)
MCHC: 32.9 g/dL (ref 30.0–36.0)
MCV: 83.5 fL (ref 80.0–100.0)
Platelets: 104 10*3/uL — ABNORMAL LOW (ref 150–400)
RBC: 5.1 MIL/uL (ref 4.22–5.81)
RDW: 14.6 % (ref 11.5–15.5)
WBC: 9.4 10*3/uL (ref 4.0–10.5)
nRBC: 0 % (ref 0.0–0.2)

## 2018-01-25 LAB — BASIC METABOLIC PANEL
ANION GAP: 11 (ref 5–15)
BUN: 18 mg/dL (ref 8–23)
CO2: 21 mmol/L — ABNORMAL LOW (ref 22–32)
Calcium: 8.6 mg/dL — ABNORMAL LOW (ref 8.9–10.3)
Chloride: 107 mmol/L (ref 98–111)
Creatinine, Ser: 1.36 mg/dL — ABNORMAL HIGH (ref 0.61–1.24)
GFR calc Af Amer: 60 mL/min — ABNORMAL LOW (ref >60)
GFR calc non Af Amer: 52 mL/min — ABNORMAL LOW (ref >60)
Glucose, Bld: 108 mg/dL — ABNORMAL HIGH (ref 70–99)
Potassium: 3.9 mmol/L (ref 3.5–5.1)
Sodium: 139 mmol/L (ref 135–145)

## 2018-01-25 LAB — HEMOGLOBIN A1C
Hgb A1c MFr Bld: 5.3 % (ref 4.8–5.6)
Mean Plasma Glucose: 105.41 mg/dL

## 2018-01-25 LAB — LIPID PANEL
CHOL/HDL RATIO: 4.8 ratio
Cholesterol: 172 mg/dL (ref 0–200)
HDL: 36 mg/dL — ABNORMAL LOW (ref >40)
LDL Cholesterol: 115 mg/dL — ABNORMAL HIGH (ref 0–99)
Triglycerides: 107 mg/dL (ref <150)
VLDL: 21 mg/dL (ref 0–40)

## 2018-01-25 LAB — ECHOCARDIOGRAM COMPLETE: Weight: 3880.1 oz

## 2018-01-25 MED ORDER — ATORVASTATIN CALCIUM 40 MG PO TABS
40.0000 mg | ORAL_TABLET | Freq: Every day | ORAL | Status: DC
  Administered 2018-01-25 – 2018-01-27 (×3): 40 mg via ORAL
  Filled 2018-01-25 (×3): qty 1

## 2018-01-25 MED ORDER — AMLODIPINE BESYLATE 10 MG PO TABS
10.0000 mg | ORAL_TABLET | Freq: Every day | ORAL | Status: DC
  Administered 2018-01-25 – 2018-01-28 (×4): 10 mg via ORAL
  Filled 2018-01-25: qty 1
  Filled 2018-01-25: qty 2
  Filled 2018-01-25 (×2): qty 1

## 2018-01-25 MED ORDER — PANTOPRAZOLE SODIUM 40 MG PO TBEC
40.0000 mg | DELAYED_RELEASE_TABLET | Freq: Every day | ORAL | Status: DC
  Administered 2018-01-25 – 2018-01-28 (×4): 40 mg via ORAL
  Filled 2018-01-25 (×4): qty 1

## 2018-01-25 MED ORDER — TRAMADOL HCL 50 MG PO TABS
50.0000 mg | ORAL_TABLET | Freq: Four times a day (QID) | ORAL | Status: DC | PRN
  Administered 2018-01-25 – 2018-01-28 (×9): 50 mg via ORAL
  Filled 2018-01-25 (×9): qty 1

## 2018-01-25 MED ORDER — CLOPIDOGREL BISULFATE 75 MG PO TABS
75.0000 mg | ORAL_TABLET | Freq: Every day | ORAL | Status: DC
  Administered 2018-01-25 – 2018-01-28 (×4): 75 mg via ORAL
  Filled 2018-01-25 (×4): qty 1

## 2018-01-25 MED ORDER — ASPIRIN EC 81 MG PO TBEC
81.0000 mg | DELAYED_RELEASE_TABLET | Freq: Every day | ORAL | Status: DC
  Administered 2018-01-25 – 2018-01-28 (×4): 81 mg via ORAL
  Filled 2018-01-25 (×4): qty 1

## 2018-01-25 MED ORDER — ENOXAPARIN SODIUM 40 MG/0.4ML ~~LOC~~ SOLN
40.0000 mg | SUBCUTANEOUS | Status: DC
  Administered 2018-01-25 – 2018-01-28 (×4): 40 mg via SUBCUTANEOUS
  Filled 2018-01-25 (×5): qty 0.4

## 2018-01-25 MED ORDER — LORAZEPAM 2 MG/ML IJ SOLN
1.0000 mg | Freq: Once | INTRAMUSCULAR | Status: AC | PRN
  Administered 2018-01-25: 1 mg via INTRAVENOUS
  Filled 2018-01-25: qty 1

## 2018-01-25 MED ORDER — SODIUM CHLORIDE 0.9 % IV SOLN
INTRAVENOUS | Status: DC
  Administered 2018-01-25 – 2018-01-27 (×2): via INTRAVENOUS

## 2018-01-25 NOTE — Progress Notes (Signed)
PT Cancellation Note  Patient Details Name: Ernest HaberGary Foulk MRN: [id number] DOB: Jun 14, 1945   Cancelled Treatment:    Reason Eval/Treat Not Completed: Active bedrest order Will await increase in activity orders prior to PT evaluation. Will follow.  Blake DivineShauna A Zaydenn Balaguer 01/25/2018, 7:12 AM Mylo RedShauna Madalyn Legner, PT, DPT Acute Rehabilitation Services Pager [phone] Office [phone]

## 2018-01-25 NOTE — Progress Notes (Signed)
  Echocardiogram 2D Echocardiogram has been performed.  Wayne SavoyCasey N Griffin Wayne Griffin 01/25/2018, 3:22 PM

## 2018-01-25 NOTE — Progress Notes (Signed)
STROKE TEAM PROGRESS NOTE   SUBJECTIVE (INTERVAL HISTORY) His RN is at the bedside.  Overall his condition is unchanged. He still has dysarthria and left hemiparesis. MRI showed right BG/CR small infarct. Pt complains of left hip and knee pain, he did fall x 2 yesterday will do hip and knee X-ray to rule out fracture. However, his left knee seems swelling with tenderness on touch. If no fracture, we may have to think about gout.    OBJECTIVE Temp:  [97.7 F (36.5 C)-98.5 F (36.9 C)] 98.5 F (36.9 C) (12/06 0800) Pulse Rate:  [65-91] 71 (12/06 0900) Cardiac Rhythm: Normal sinus rhythm (12/06 0800) Resp:  [8-21] 11 (12/06 0900) BP: (101-189)/(67-171) 136/76 (12/06 0900) SpO2:  [91 %-97 %] 92 % (12/06 0900)  Recent Labs  Lab 01/24/18 0831  GLUCAP 156*   Recent Labs  Lab 01/24/18 0836 01/24/18 0840 01/25/18 0402  NA 140 140 139  K 4.0 3.9 3.9  CL 105 104 107  CO2 20*  --  21*  GLUCOSE 159* 159* 108*  BUN 18 22 18   CREATININE 1.68* 1.50* 1.36*  CALCIUM 9.1  --  8.6*   Recent Labs  Lab 01/24/18 0836  AST 25  ALT 17  ALKPHOS 50  BILITOT 1.0  PROT 7.1  ALBUMIN 3.8   Recent Labs  Lab 01/24/18 0836 01/24/18 0840 01/25/18 0402  WBC 15.7*  --  9.4  NEUTROABS 13.3*  --   --   HGB 14.6 15.6 14.0  HCT 46.4 46.0 42.6  MCV 85.6  --  83.5  PLT PLATELET CLUMPS NOTED ON SMEAR, COUNT APPEARS DECREASED  --  104*   No results for input(s): CKTOTAL, CKMB, CKMBINDEX, TROPONINI in the last 168 hours. Recent Labs    01/24/18 0836  LABPROT 13.5  INR 1.04   No results for input(s): COLORURINE, LABSPEC, PHURINE, GLUCOSEU, HGBUR, BILIRUBINUR, KETONESUR, PROTEINUR, UROBILINOGEN, NITRITE, LEUKOCYTESUR in the last 72 hours.  Invalid input(s): APPERANCEUR     Component Value Date/Time   CHOL 172 01/25/2018 0402   TRIG 107 01/25/2018 0402   HDL 36 (L) 01/25/2018 0402   CHOLHDL 4.8 01/25/2018 0402   VLDL 21 01/25/2018 0402   LDLCALC 115 (H) 01/25/2018 0402   Lab Results   Component Value Date   HGBA1C 5.3 01/25/2018   No results found for: LABOPIA, COCAINSCRNUR, LABBENZ, AMPHETMU, THCU, LABBARB  No results for input(s): ETH in the last 168 hours.  I have personally reviewed the radiological images below and agree with the radiology interpretations.  Ct Angio Head W Or Wo Contrast  Result Date: 01/24/2018 CLINICAL DATA:  Left-sided weakness.  Code stroke. EXAM: CT ANGIOGRAPHY HEAD AND NECK CT PERFUSION BRAIN TECHNIQUE: Multidetector CT imaging of the head and neck was performed using the standard protocol during bolus administration of intravenous contrast. Multiplanar CT image reconstructions and MIPs were obtained to evaluate the vascular anatomy. Carotid stenosis measurements (when applicable) are obtained utilizing NASCET criteria, using the distal internal carotid diameter as the denominator. Multiphase CT imaging of the brain was performed following IV bolus contrast injection. Subsequent parametric perfusion maps were calculated using RAPID software. CONTRAST:  ISOVUE-370 IOPAMIDOL (ISOVUE-370) INJECTION 76% COMPARISON:  Head CT earlier same day. FINDINGS: CTA NECK FINDINGS Aortic arch: Aortic atherosclerosis. No aneurysm or dissection. Left vertebral artery originates from the arch. Right carotid system: Common carotid artery is widely patent to the bifurcation region. Mild soft and calcified plaque at the carotid bifurcation and ICA bulb but no stenosis. Cervical  ICA widely patent. The patient has the unusual variation of a small branch vessel from the cervical ICA that appears to serve external carotid territory. Left carotid system: Common carotid artery widely patent to the bifurcation region. Soft and calcified plaque at the ICA bifurcation. Minimal diameter at the distal bulb is 4 mm. Compared to a more distal cervical ICA diameter of 5 mm, this indicates only a 20% stenosis. Vertebral arteries: Right vertebral artery is dominant. Right vertebral artery  origin is widely patent. The vessel is widely patent through the cervical region to the foramen magnum. Left vertebral artery arises from the arch. This is a small vessel that appears normally patent through the cervical region to the foramen magnum. Skeleton: Ordinary cervical spondylosis. Other neck: No mass or lymphadenopathy. Upper chest: Mild apical scarring.  No active process. Review of the MIP images confirms the above findings CTA HEAD FINDINGS Anterior circulation: Both internal carotid arteries are patent through the skull base and siphon regions. There is atherosclerotic change within the siphon regions but no stenosis greater than 30% suspected. The anterior and middle cerebral vessels are patent. No large or medium vessel occlusion. There is 50% stenosis of the proximal M2 segment on the right immediately past the origin the temporal branch. I do not see any occluded or missing branches. Posterior circulation: Both vertebral arteries are patent through the foramen magnum to the basilar. The left is diminutive. No basilar stenosis. Posterior circulation branch vessels are patent. Right PCA has fetal origin from the anterior circulation. Venous sinuses: Patent and normal. Anatomic variants: None other significant. Delayed phase: No abnormal enhancement. Review of the MIP images confirms the above findings CT Brain Perfusion Findings: CBF (<30%) Volume: 0mL Perfusion (Tmax>6.0s) volume: 0mL Mismatch Volume: 0mL Infarction Location:None IMPRESSION: Negative perfusion study. Atherosclerosis of the aortic arch. Atherosclerosis at both carotid bifurcations and ICA bulb regions. No stenosis on the right. 20% stenosis in the left ICA bulb. Intracranial atherosclerotic irregularity. 50% stenosis of the proximal M2 segment on the right immediately past the anterior temporal branch. The study does not show any occluded large or medium vessels. These results were communicated to Dr. Wilford Corner At 9:14 amon 12/5/2019by  text page via the [facility] messaging system. Electronically Signed   By: Paulina Fusi M.D.   On: 01/24/2018 09:15   Ct Angio Neck W And/or Wo Contrast  Result Date: 01/24/2018 CLINICAL DATA:  Left-sided weakness.  Code stroke. EXAM: CT ANGIOGRAPHY HEAD AND NECK CT PERFUSION BRAIN TECHNIQUE: Multidetector CT imaging of the head and neck was performed using the standard protocol during bolus administration of intravenous contrast. Multiplanar CT image reconstructions and MIPs were obtained to evaluate the vascular anatomy. Carotid stenosis measurements (when applicable) are obtained utilizing NASCET criteria, using the distal internal carotid diameter as the denominator. Multiphase CT imaging of the brain was performed following IV bolus contrast injection. Subsequent parametric perfusion maps were calculated using RAPID software. CONTRAST:  ISOVUE-370 IOPAMIDOL (ISOVUE-370) INJECTION 76% COMPARISON:  Head CT earlier same day. FINDINGS: CTA NECK FINDINGS Aortic arch: Aortic atherosclerosis. No aneurysm or dissection. Left vertebral artery originates from the arch. Right carotid system: Common carotid artery is widely patent to the bifurcation region. Mild soft and calcified plaque at the carotid bifurcation and ICA bulb but no stenosis. Cervical ICA widely patent. The patient has the unusual variation of a small branch vessel from the cervical ICA that appears to serve external carotid territory. Left carotid system: Common carotid artery widely patent to the bifurcation  region. Soft and calcified plaque at the ICA bifurcation. Minimal diameter at the distal bulb is 4 mm. Compared to a more distal cervical ICA diameter of 5 mm, this indicates only a 20% stenosis. Vertebral arteries: Right vertebral artery is dominant. Right vertebral artery origin is widely patent. The vessel is widely patent through the cervical region to the foramen magnum. Left vertebral artery arises from the arch. This is a small vessel  that appears normally patent through the cervical region to the foramen magnum. Skeleton: Ordinary cervical spondylosis. Other neck: No mass or lymphadenopathy. Upper chest: Mild apical scarring.  No active process. Review of the MIP images confirms the above findings CTA HEAD FINDINGS Anterior circulation: Both internal carotid arteries are patent through the skull base and siphon regions. There is atherosclerotic change within the siphon regions but no stenosis greater than 30% suspected. The anterior and middle cerebral vessels are patent. No large or medium vessel occlusion. There is 50% stenosis of the proximal M2 segment on the right immediately past the origin the temporal branch. I do not see any occluded or missing branches. Posterior circulation: Both vertebral arteries are patent through the foramen magnum to the basilar. The left is diminutive. No basilar stenosis. Posterior circulation branch vessels are patent. Right PCA has fetal origin from the anterior circulation. Venous sinuses: Patent and normal. Anatomic variants: None other significant. Delayed phase: No abnormal enhancement. Review of the MIP images confirms the above findings CT Brain Perfusion Findings: CBF (<30%) Volume: 0mL Perfusion (Tmax>6.0s) volume: 0mL Mismatch Volume: 0mL Infarction Location:None IMPRESSION: Negative perfusion study. Atherosclerosis of the aortic arch. Atherosclerosis at both carotid bifurcations and ICA bulb regions. No stenosis on the right. 20% stenosis in the left ICA bulb. Intracranial atherosclerotic irregularity. 50% stenosis of the proximal M2 segment on the right immediately past the anterior temporal branch. The study does not show any occluded large or medium vessels. These results were communicated to Dr. Wilford Corner At 9:14 amon 12/5/2019by text page via the [facility] messaging system. Electronically Signed   By: Paulina Fusi M.D.   On: 01/24/2018 09:15   Mr Brain Wo Contrast  Result Date: 01/25/2018 CLINICAL  DATA:  Intermittent left-sided weakness.  Fall. EXAM: MRI HEAD WITHOUT CONTRAST TECHNIQUE: Multiplanar, multiecho pulse sequences of the brain and surrounding structures were obtained without intravenous contrast. COMPARISON:  CTA head and neck and CT perfusion 01/24/2018 FINDINGS: Brain: The diffusion-weighted images demonstrate acute/subacute infarct involving the posterior aspect of the right lentiform nucleus, of the posterior limb right internal capsule, and posterior body of the right caudate. Maximal dimension of the infarct territory is 2.1 cm. No other acute infarct is present. An area of remote hemorrhage is present in the right lentiform nucleus, just anterior to the acute infarct. T2 signal changes are associated with the acute/subacute infarct. Multiple other remote nonhemorrhagic lacunar infarcts are present in the basal ganglia bilaterally. Remote lacunar infarcts extend into the corona radiata. Remote lacunar infarcts are also present the thalami bilaterally. Remote lacunar infarcts are present within the cerebellum bilaterally. Brainstem is unremarkable. Internal auditory canals are within normal limits bilaterally. Ventricles are proportionate to the degree of atrophy. No significant extra-axial fluid collection is present. Vascular: Flow is present in the major intracranial arteries. Skull and upper cervical spine: Skull base is within normal limits. Degenerative disc disease present at C2-3. Craniocervical junction is otherwise normal. Sinuses/Orbits: Mild mucosal thickening is present in the left maxillary sinus, bilateral sphenoid sinuses and ethmoid air cells. Mucosal thickening extends into  the left frontal sinus. No fluid levels are present. Mastoid air cells are clear. Globes and orbits are within normal limits bilaterally. IMPRESSION: 1. Acute/subacute nonhemorrhagic infarct involving the posterior right lentiform nucleus, posterior limb right internal capsule, and dorsal caudate. 2.  Remote hemorrhagic infarct of the right lentiform nucleus just anterior to the area of acute/subacute infarction. 3. Multiple other remote lacunar infarcts are present within the basal ganglia bilaterally, the thalami bilaterally, and the cerebellum bilaterally. Electronically Signed   By: Marin Roberts M.D.   On: 01/25/2018 07:46   Ct Cerebral Perfusion W Contrast  Result Date: 01/24/2018 CLINICAL DATA:  Left-sided weakness.  Code stroke. EXAM: CT ANGIOGRAPHY HEAD AND NECK CT PERFUSION BRAIN TECHNIQUE: Multidetector CT imaging of the head and neck was performed using the standard protocol during bolus administration of intravenous contrast. Multiplanar CT image reconstructions and MIPs were obtained to evaluate the vascular anatomy. Carotid stenosis measurements (when applicable) are obtained utilizing NASCET criteria, using the distal internal carotid diameter as the denominator. Multiphase CT imaging of the brain was performed following IV bolus contrast injection. Subsequent parametric perfusion maps were calculated using RAPID software. CONTRAST:  ISOVUE-370 IOPAMIDOL (ISOVUE-370) INJECTION 76% COMPARISON:  Head CT earlier same day. FINDINGS: CTA NECK FINDINGS Aortic arch: Aortic atherosclerosis. No aneurysm or dissection. Left vertebral artery originates from the arch. Right carotid system: Common carotid artery is widely patent to the bifurcation region. Mild soft and calcified plaque at the carotid bifurcation and ICA bulb but no stenosis. Cervical ICA widely patent. The patient has the unusual variation of a small branch vessel from the cervical ICA that appears to serve external carotid territory. Left carotid system: Common carotid artery widely patent to the bifurcation region. Soft and calcified plaque at the ICA bifurcation. Minimal diameter at the distal bulb is 4 mm. Compared to a more distal cervical ICA diameter of 5 mm, this indicates only a 20% stenosis. Vertebral arteries: Right  vertebral artery is dominant. Right vertebral artery origin is widely patent. The vessel is widely patent through the cervical region to the foramen magnum. Left vertebral artery arises from the arch. This is a small vessel that appears normally patent through the cervical region to the foramen magnum. Skeleton: Ordinary cervical spondylosis. Other neck: No mass or lymphadenopathy. Upper chest: Mild apical scarring.  No active process. Review of the MIP images confirms the above findings CTA HEAD FINDINGS Anterior circulation: Both internal carotid arteries are patent through the skull base and siphon regions. There is atherosclerotic change within the siphon regions but no stenosis greater than 30% suspected. The anterior and middle cerebral vessels are patent. No large or medium vessel occlusion. There is 50% stenosis of the proximal M2 segment on the right immediately past the origin the temporal branch. I do not see any occluded or missing branches. Posterior circulation: Both vertebral arteries are patent through the foramen magnum to the basilar. The left is diminutive. No basilar stenosis. Posterior circulation branch vessels are patent. Right PCA has fetal origin from the anterior circulation. Venous sinuses: Patent and normal. Anatomic variants: None other significant. Delayed phase: No abnormal enhancement. Review of the MIP images confirms the above findings CT Brain Perfusion Findings: CBF (<30%) Volume: 0mL Perfusion (Tmax>6.0s) volume: 0mL Mismatch Volume: 0mL Infarction Location:None IMPRESSION: Negative perfusion study. Atherosclerosis of the aortic arch. Atherosclerosis at both carotid bifurcations and ICA bulb regions. No stenosis on the right. 20% stenosis in the left ICA bulb. Intracranial atherosclerotic irregularity. 50% stenosis of the proximal  M2 segment on the right immediately past the anterior temporal branch. The study does not show any occluded large or medium vessels. These results were  communicated to Dr. Wilford Corner At 9:14 amon 12/5/2019by text page via the [facility] messaging system. Electronically Signed   By: Paulina Fusi M.D.   On: 01/24/2018 09:15   Dg Chest Port 1 View  Result Date: 01/25/2018 CLINICAL DATA:  Respiratory difficulty EXAM: PORTABLE CHEST 1 VIEW COMPARISON:  02/19/2011 FINDINGS: Cardiac shadow is mildly enlarged but accentuated by the portable technique. The lungs are well aerated bilaterally without focal infiltrate or sizable effusion. No acute bony abnormality is seen. IMPRESSION: No active disease. Electronically Signed   By: Alcide Clever M.D.   On: 01/25/2018 07:31   Ct Head Code Stroke Wo Contrast  Result Date: 01/24/2018 CLINICAL DATA:  Code stroke.  Left-sided weakness.  Code stroke. EXAM: CT HEAD WITHOUT CONTRAST TECHNIQUE: Contiguous axial images were obtained from the base of the skull through the vertex without intravenous contrast. COMPARISON:  None. FINDINGS: Brain: Age related atrophy. Mild chronic small-vessel ischemic change of the hemispheric white matter. No sign of acute parenchymal infarction. Old small vessel infarctions affect the thalami, basal ganglia and external capsule regions. No mass, hemorrhage, hydrocephalus or extra-axial collection. Vascular: There is atherosclerotic calcification of the major vessels at the base of the brain. 1 could question a small hyperdense focus in the right MCA bifurcation region. This is certainly not definite evidence acute embolic disease. Skull: Normal Sinuses/Orbits: Clear/normal Other: None ASPECTS ([place] Stroke Program Early CT Score) - Ganglionic level infarction (caudate, lentiform nuclei, internal capsule, insula, M1-M3 cortex): 7 - Supraganglionic infarction (M4-M6 cortex): 3 Total score (0-10 with 10 being normal): 10 IMPRESSION: 1. No acute parenchymal finding. Chronic small-vessel ischemic changes. 2. ASPECTS is 10. 3. One could question a focal hyperdensity at the right MCA bifurcation, but this is not  definite. 4. This result was communicated by pager to the stroke service. Electronically Signed   By: Paulina Fusi M.D.   On: 01/24/2018 08:50   TTE pending   PHYSICAL EXAM  Temp:  [97.7 F (36.5 C)-98.5 F (36.9 C)] 98.5 F (36.9 C) (12/06 0800) Pulse Rate:  [65-91] 71 (12/06 0900) Resp:  [8-21] 11 (12/06 0900) BP: (101-189)/(67-171) 136/76 (12/06 0900) SpO2:  [91 %-97 %] 92 % (12/06 0900)  General - Well nourished, well developed, in mild distress with left knee pain.  Ophthalmologic - fundi not visualized due to noncooperation.  Cardiovascular - Regular rate and rhythm.  Mental Status -  Level of arousal and orientation to time, place, and person were intact. Language including expression, naming, repetition, comprehension was assessed and found intact, however, moderate dysarthria  Cranial Nerves II - XII - II - Visual field intact OU. III, IV, VI - Extraocular movements intact. V - Facial sensation intact bilaterally. VII - left facial droop. VIII - Hearing & vestibular intact bilaterally. X - Palate elevates symmetrically, moderate dysarthria. XI - Chin turning & shoulder shrug intact bilaterally. XII - Tongue protrusion intact.  Motor Strength - The patient's strength was normal in RUE and RLE but left UE 2/5 proximal and 0/5 distally, LLE 2/5 proximal and distal.  Bulk was normal and fasciculations were absent.   Motor Tone - Muscle tone was assessed at the neck and appendages and was mildly elevated at LUE and LLE.  Reflexes - The patient's reflexes were symmetrical in all extremities and he had left babinski.  Sensory - Light touch, temperature/pinprick  were assessed and were symmetrical.    Coordination - The patient had normal movements in the right hand with no ataxia or dysmetria.  Tremor was present on the right hand, resting and action tremor pt complains of nervousness.  Gait and Station - deferred.   ASSESSMENT/PLAN Wayne Griffin is a 72 y.o. male  with history of left knee surgery many years ago, not seeing doctor for years admitted for left sided weakness.TPA given.    Stroke:  right BG/CR infarct likely secondary to small vessel disease source  Resultant left sided weakness and dysarthria  MRI  Right BG/CR infarct  CT no acute abnormality  CTA head and neck right M1 50% stenosis, no LVO  2D Echo pending  LDL 115  HgbA1c 5.3  lovenox for VTE prophylaxis  On diet  No antithrombotic prior to admission, now on aspirin 81 mg daily and clopidogrel 75 mg daily. DAPT for 3 weeks and then ASA alone  Patient counseled to be compliant with his antithrombotic medications  Ongoing aggressive stroke risk factor management  Therapy recommendations:  Pending   Disposition:  Pending   Hypertension . Stable  . Now off cleviprex . Add amlodipine 10  BP goal < 180/105  Long term BP goal normotensive  Hyperlipidemia  Home meds:  none   LDL 115, goal < 70  Now on lipitor 40  Continue statin at discharge  Left knee / hip pain  Fall x 2 at home  Left hip and knee X-ray to rule out fracture  Left knee swelling with tenderness on palpation   Left knee surgery many years ago  Tramadol PRN  Do not feel septic arthritis   Will consider empiric ?? gout treatment ?? If no fracture  Other Stroke Risk Factors  Advanced age  Obesity, There is no height or weight on file to calculate BMI.   Other Active Problems  Elevated Cre 1.68->1.50->1.36  Leukocytosis WBC 15.7 -> 9.4 - CXR neg, afebrile   Hospital day # 1  This patient is critically ill due to stroke s/p tPA, hypertensive emergency  and at significant risk of neurological worsening, death form recurrent stroke, hemorrhagic conversion. This patient's care requires constant monitoring of vital signs, hemodynamics, respiratory and cardiac monitoring, review of multiple databases, neurological assessment, discussion with family, other specialists and medical  decision making of high complexity. I spent 40 minutes of neurocritical care time in the care of this patient.   Marvel PlanJindong Amaria Mundorf, MD PhD Stroke Neurology 01/25/2018 9:36 AM    To contact Stroke Continuity provider, please refer to [email or web address]. After hours, contact General Neurology

## 2018-01-25 NOTE — Evaluation (Signed)
Speech Language Pathology Evaluation Patient Details Name: Wayne HaberGary Griffin MRN: [id number] DOB: 05/15/1945 Today's Date: Griffin Time: [id number] SLP Time Calculation (min) (ACUTE ONLY): 25 min  Problem List:  Patient Active Problem List   Diagnosis Date Noted  . Stroke [facility](HCC) 01/24/2018   Past Medical History: History reviewed. No pertinent past medical history. Past Surgical History: History reviewed. No pertinent surgical history. HPI:  Wayne Griffin is a 72 y.o. male no pertinent history secondary to not seen PCP in a very long time.  Per son patient had a period of left-sided arm and leg weakness which caused the fall last night but he apparently resolved completely.  Patient went to bed and woke up at approximately 5 AM at this point got out of bed and fell once again but the symptoms did not resolve.  Patient was brought to the ED and left facial droop, left-sided hemiparesis was noted.  Pt was given tPA.  CT imaging negative so far.   Assessment / Plan / Recommendation Clinical Impression  Pts presents with a mild to moderate dysarthria; pt is stimulable for slow, loud overarticulated speech to increase intelligibility. No further deficits observed. Will follow to facilitate use of strategies.     SLP Assessment  SLP Recommendation/Assessment: Patient needs continued Speech Lanaguage Pathology Services SLP Visit Diagnosis: Dysarthria and anarthria (R47.1)    Follow Up Recommendations  Inpatient Rehab    Frequency and Duration min 2x/week  2 weeks      SLP Evaluation Cognition  Overall Cognitive Status: Within Functional Limits for tasks assessed Arousal/Alertness: Awake/alert Orientation Level: Oriented X4 Attention: Divided Divided Attention: Appears intact Memory: Appears intact Awareness: Appears intact Problem Solving: Appears intact Executive Function: Reasoning;Self Monitoring;Self Correcting Reasoning: Appears intact Self Monitoring: Appears intact Self  Correcting: Appears intact Safety/Judgment: Appears intact       Comprehension  Auditory Comprehension Overall Auditory Comprehension: Appears within functional limits for tasks assessed    Expression Verbal Expression Overall Verbal Expression: Appears within functional limits for tasks assessed   Oral / Motor  Oral Motor/Sensory Function Overall Oral Motor/Sensory Function: Moderate impairment Facial ROM: Reduced left Facial Symmetry: Abnormal symmetry left Facial Strength: Reduced left Facial Sensation: Within Functional Limits Lingual ROM: Within Functional Limits Lingual Symmetry: Within Functional Limits Lingual Strength: Within Functional Limits Lingual Sensation: Within Functional Limits Velum: Within Functional Limits Mandible: Within Functional Limits Motor Speech Overall Motor Speech: Impaired Respiration: Within functional limits Phonation: Normal Resonance: Within functional limits Articulation: Impaired Level of Impairment: Conversation Intelligibility: Intelligibility reduced Word: 75-100% accurate Phrase: 75-100% accurate Sentence: 75-100% accurate Conversation: 75-100% accurate Motor Planning: Witnin functional limits Motor Speech Errors: Aware;Consistent   GO                   Harlon DittyBonnie Jayant Kriz, MA CCC-SLP  Acute Rehabilitation Services Pager [phone] Office [phone]  Claudine MoutonDeBlois, Wayne Griffin, 2:04 PM

## 2018-01-25 NOTE — Progress Notes (Signed)
  Speech Language Pathology Treatment: Dysphagia  Patient Details Name: Wayne Griffin MRN: [id number] DOB: 1945-08-03 Today's Date: 01/25/2018 Time: 4098-11911100-1125 SLP Time Calculation (min) (ACUTE ONLY): 25 min  Assessment / Plan / Recommendation Clinical Impression  Pt tolerating thin liquids without signs of aspiration. Pt reports chewing solids is awkward and tiring due to need to check left buccal cavity. He recalled all strategies given by preceding therapist and desptie difficulty is managing well enough to continue diet. Will follow for ability to advance diet and utilize best techniques during meal with solids.   HPI HPI: Wayne HaberGary Griffin is a 72 y.o. male no pertinent history secondary to not seen PCP in a very long time.  Per son patient had a period of left-sided arm and leg weakness which caused the fall last night but he apparently resolved completely.  Patient went to bed and woke up at approximately 5 AM at this point got out of bed and fell once again but the symptoms did not resolve.  Patient was brought to the ED and left facial droop, left-sided hemiparesis was noted.  Pt was given tPA.  CT imaging negative so far.      SLP Plan  Continue with current plan of care       Recommendations  Diet recommendations: Dysphagia 3 (mechanical soft);Thin liquid Liquids provided via: Straw Medication Administration: Whole meds with liquid Supervision: Patient able to self feed Compensations: Slow rate;Small sips/bites;Lingual sweep for clearance of pocketing;Follow solids with liquid Postural Changes and/or Swallow Maneuvers: Seated upright 90 degrees;Upright 30-60 min after meal                Oral Care Recommendations: Oral care BID Plan: Continue with current plan of care       GO               Wayne DittyBonnie Chanti Golubski, MA CCC-SLP  Acute Rehabilitation Services Pager [phone] Office [phone]  Wayne Griffin, Wayne Griffin Wayne Griffin 01/25/2018, 1:58 PM

## 2018-01-25 NOTE — Progress Notes (Signed)
PT Cancellation Note  Patient Details Name: Wayne Griffin MRN: [id number] DOB: 03-17-1945   Cancelled Treatment:    Reason Eval/Treat Not Completed: Patient at procedure or test/unavailable Pt off floor at test. Will follow up as time allows.   Blake DivineShauna A Emmersen Garraway 01/25/2018, 2:05 PM Mylo RedShauna Derric Dealmeida, PT, DPT Acute Rehabilitation Services Pager [phone] Office [phone]

## 2018-01-25 NOTE — Progress Notes (Signed)
  Echocardiogram 2D Echocardiogram was attempted but X-Ray was with the patient and then PT was waiting to go in next.  Wayne SavoyCasey N Borden Thune 01/25/2018, 11:15 AM

## 2018-01-25 NOTE — Progress Notes (Signed)
Pt arrived to unit, aox4 

## 2018-01-25 NOTE — Evaluation (Signed)
Occupational Therapy Evaluation Patient Details Name: Wayne Griffin MRN: [id number] DOB: 03/04/1945 Today's Date: 01/25/2018    History of Present Illness 72 y.o. male  with no pertinent history secondary as pt hasn't seen PCP in a very long time.  Per son patient had a period of left-sided arm and leg weakness causing x2 falls PTA. Patient was brought to the ED with left facial droop, left-sided hemiparesis was noted.  Pt given tPA. MRI shows Acute/subacute nonhemorrhagic infarct involving the posterior right lentiform nucleus, posterior limb right internal capsule, and dorsal caudate; remote hemorrhagic infarct of the right lentiform nucleus and multiple remote lacunar infarcts within BG, thalami, and cerebellum bilaterally. Imaging of L hip/knee completed with no acute fractures or changes noted in hip, no fractures seen radiographically for knee but does show High density suprapatellar joint effusion which is suggestive of radio occult fracture or internal derangement of the knee   Clinical Impression   This 72 y/o male presents with the above. At baseline pt is independent-mod independent with ADLs and functional mobility, reports just recently began using [facility] for mobility. Pt presenting with LLE pain, L side weakness, decreased sitting balance impacting his functional performance. Pt requiring maxA for bed mobility; overall able to maintain static sitting balance with minA and briefly with minguard assist, requires increased assist with balance challenged. Pt currently requires modA for UB ADL, max-totalA for LB ADLs. Pt with very support son present during session. Pt will benefit from continued acute OT services and given pt's PLOF feel he will benefit from continued OT services in CIR setting after discharge to maximize his safety and independence with ADLs and mobility. Will follow.     Follow Up Recommendations  CIR;Supervision/Assistance - 24 hour    Equipment Recommendations  3 in 1  bedside commode;Other (comment)(TBD in next venue)           Precautions / Restrictions Precautions Precautions: Fall Precaution Comments: L hemi Restrictions Weight Bearing Restrictions: No      Mobility Bed Mobility Overal bed mobility: Needs Assistance Bed Mobility: Supine to Sit;Sit to Supine     Supine to sit: Max assist Sit to supine: Max assist   General bed mobility comments: assist for LLE and to elevate trunk, assist to scoot hips towards EOB. Assist for LEs onto EOB when returning to supine  Transfers                 General transfer comment: did not attempt due to pt with increased LLE pain, feel pt will require +2 assist for safe transfer    Balance Overall balance assessment: Needs assistance Sitting-balance support: Single extremity supported;Feet supported Sitting balance-Leahy Scale: Poor Sitting balance - Comments: requires close minguard-intermittent minA for sitting balance; modA when balance challenged Postural control: Right lateral lean;Posterior lean                                 ADL either performed or assessed with clinical judgement   ADL Overall ADL's : Needs assistance/impaired Eating/Feeding: Set up;Minimal assistance;Bed level;Sitting   Grooming: Set up;Minimal assistance;Sitting   Upper Body Bathing: Moderate assistance;Sitting   Lower Body Bathing: Maximal assistance;Sitting/lateral leans   Upper Body Dressing : Moderate assistance;Sitting   Lower Body Dressing: Maximal assistance;Sitting/lateral leans                 General ADL Comments: pt presenting with L side weakness, decreased sitting and standing balance  Vision Baseline Vision/History: Wears glasses Wears Glasses: At all times Patient Visual Report: Blurring of vision Vision Assessment?: Yes Eye Alignment: Within Functional Limits Ocular Range of Motion: Within Functional Limits Alignment/Gaze Preference: Within Defined  Limits Tracking/Visual Pursuits: Able to track stimulus in all quads without difficulty(will continue to assess, pt distracted during assessment)     Perception     Praxis      Pertinent Vitals/Pain Pain Assessment: 0-10 Pain Score: 5  Pain Location: L knee/hip Pain Descriptors / Indicators: Aching;Discomfort;Sore Pain Intervention(s): Limited activity within patient's tolerance;Monitored during session;Repositioned     Hand Dominance Right   Extremity/Trunk Assessment Upper Extremity Assessment Upper Extremity Assessment: LUE deficits/detail LUE Deficits / Details: flaccid LUE, grossly 1/5  LUE Sensation: decreased light touch LUE Coordination: decreased fine motor;decreased gross motor   Lower Extremity Assessment Lower Extremity Assessment: Defer to PT evaluation       Communication Communication Communication: Other (comment)(slightly slurred speech)   Cognition Arousal/Alertness: Awake/alert Behavior During Therapy: WFL for tasks assessed/performed Overall Cognitive Status: Impaired/Different from baseline Area of Impairment: Attention                   Current Attention Level: Selective           General Comments: pt with difficulty focusing on task at hand during session when provided with environmental distractions (grandson in room)`   General Comments       Exercises     Shoulder Instructions      Home Living Family/patient expects to be discharged to:: Private residence Living Arrangements: Children(grandchildren) Available Help at Discharge: Family Type of Home: House Home Access: Stairs to enter Secretary/administratorntrance Stairs-Number of Steps: 2-3 Entrance Stairs-Rails: None Home Layout: One level     Bathroom Shower/Tub: Producer, television/film/videoWalk-in shower   Bathroom Toilet: Standard     Home Equipment: Medical laboratory scientific officerCane - single point      Lives With: Son    Prior Functioning/Environment Level of Independence: Independent with assistive device(s)        Comments:  overall independent though had recently begun using SPC for ambulation        OT Problem List: Decreased strength;Decreased range of motion;Decreased activity tolerance;Impaired balance (sitting and/or standing);Impaired vision/perception;Impaired UE functional use;Impaired sensation;Pain      OT Treatment/Interventions: Self-care/ADL training;Therapeutic exercise;Neuromuscular education;Energy conservation;Therapeutic activities;Visual/perceptual remediation/compensation;Patient/family education;Balance training    OT Goals(Current goals can be found in the care plan section) Acute Rehab OT Goals Patient Stated Goal: return to independence OT Goal Formulation: With patient Time For Goal Achievement: 02/08/18 Potential to Achieve Goals: Good  OT Frequency: Min 3X/week   Barriers to D/C:            Co-evaluation              AM-PAC OT "6 Clicks" Daily Activity     Outcome Measure Help from another person eating meals?: None Help from another person taking care of personal grooming?: A Little Help from another person toileting, which includes using toliet, bedpan, or urinal?: Total Help from another person bathing (including washing, rinsing, drying)?: A Lot Help from another person to put on and taking off regular upper body clothing?: A Lot Help from another person to put on and taking off regular lower body clothing?: Total 6 Click Score: 13   End of Session Nurse Communication: Mobility status  Activity Tolerance: Patient tolerated treatment well Patient left: in bed;with call bell/phone within reach;with bed alarm set;with family/visitor present  OT Visit Diagnosis: Other abnormalities of  gait and mobility (R26.89);Muscle weakness (generalized) (M62.81);Hemiplegia and hemiparesis Hemiplegia - Right/Left: Left Hemiplegia - dominant/non-dominant: Non-Dominant Hemiplegia - caused by: Cerebral infarction                Time: 1610-9604 OT Time Calculation (min): 39  min Charges:  OT General Charges $OT Visit: 1 Visit OT Evaluation $OT Eval Moderate Complexity: 1 Mod OT Treatments $Self Care/Home Management : 23-37 mins  Marcy Siren, OT Supplemental Rehabilitation Services Pager [phone] Office [phone]   Orlando Penner 01/25/2018, 5:03 PM

## 2018-01-25 NOTE — Evaluation (Signed)
Physical Therapy Evaluation Patient Details Name: Wayne Griffin MRN: [id number] DOB: 08/03/1945 Today's Date: 01/25/2018   History of Present Illness  72 y.o. male  with no pertinent history secondary as pt hasn't seen PCP in a very long time.  Per son patient had a period of left-sided arm and leg weakness causing x2 falls PTA. Patient was brought to the ED with left facial droop, left-sided hemiparesis was noted.  Pt given tPA. MRI shows Acute/subacute nonhemorrhagic infarct involving the posterior right lentiform nucleus, posterior limb right internal capsule, and dorsal caudate; remote hemorrhagic infarct of the right lentiform nucleus and multiple remote lacunar infarcts within BG, thalami, and cerebellum bilaterally. Imaging of L hip/knee completed with no acute fractures or changes noted in hip, no fractures seen radiographically for knee but does show High density suprapatellar joint effusion which is suggestive of radio occult fracture or internal derangement of the knee  Clinical Impression  Pt admitted with/for stroke with left sided weakness.  Pt needing mod to maximal assist for basic mobility.  Pt currently limited functionally due to the problems listed. ( See problems list.)   Pt will benefit from PT to maximize function and safety in order to get ready for next venue listed below.     Follow Up Recommendations CIR    Equipment Recommendations  Other (comment)(TBA)    Recommendations for Other Services Rehab consult     Precautions / Restrictions Precautions Precautions: Fall Precaution Comments: L hemi Restrictions Weight Bearing Restrictions: No      Mobility  Bed Mobility Overal bed mobility: Needs Assistance Bed Mobility: Supine to Sit;Sit to Supine     Supine to sit: Max assist Sit to supine: Max assist   General bed mobility comments: assist for LLE and to elevate trunk, very little assist to scoot hips towards EOB. Assist for LEs onto EOB when returning to  supine  Transfers Overall transfer level: Needs assistance   Transfers: Sit to/from Stand;Lateral/Scoot Transfers Sit to Stand: Max assist        Lateral/Scoot Transfers: Max assist General transfer comment: pt able to utilize r side well to stand with w/shift and L knee blocking/guarding.  6 lateral scoots up toward HOB with max assist, but with good directed effort by pt.  Ambulation/Gait                Stairs            Wheelchair Mobility    Modified Rankin (Stroke Patients Only) Modified Rankin (Stroke Patients Only) Pre-Morbid Rankin Score: No symptoms Modified Rankin: Severe disability     Balance Overall balance assessment: Needs assistance Sitting-balance support: Single extremity supported;Feet supported Sitting balance-Leahy Scale: Poor Sitting balance - Comments: requires close minguard-intermittent minA for sitting balance; modA when balance challenged Postural control: Right lateral lean;Posterior lean Standing balance support: Single extremity supported;During functional activity Standing balance-Leahy Scale: Poor Standing balance comment: standing x2 with face to face technique                             Pertinent Vitals/Pain Pain Assessment: 0-10 Pain Score: 5  Pain Location: L knee/hip Pain Descriptors / Indicators: Aching;Discomfort;Sore Pain Intervention(s): Limited activity within patient's tolerance    Home Living Family/patient expects to be discharged to:: Private residence Living Arrangements: Children(grandchildren) Available Help at Discharge: Family Type of Home: House Home Access: Stairs to enter Entrance Stairs-Rails: None Entrance Stairs-Number of Steps: 2-3 Home Layout: One level Home  Equipment: Gilmer Morane - single point      Prior Function Level of Independence: Independent with assistive device(s)         Comments: overall independent though had recently begun using SPC for ambulation     Hand  Dominance   Dominant Hand: Right    Extremity/Trunk Assessment   Upper Extremity Assessment Upper Extremity Assessment: LUE deficits/detail LUE Deficits / Details: flaccid LUE, grossly 1/5  LUE Sensation: decreased light touch LUE Coordination: decreased fine motor;decreased gross motor    Lower Extremity Assessment Lower Extremity Assessment: LLE deficits/detail LLE Deficits / Details: trace gross flexion and 2/5 gross extension.  Pt unable to bear weight with control.  painful knee. LLE Sensation: (light touch available) LLE Coordination: decreased fine motor;decreased gross motor       Communication   Communication: Other (comment)(slightly slurred speech)  Cognition Arousal/Alertness: Awake/alert Behavior During Therapy: WFL for tasks assessed/performed Overall Cognitive Status: Impaired/Different from baseline Area of Impairment: Attention                   Current Attention Level: Selective           General Comments: pt with difficulty focusing on task at hand during session when provided with environmental distractions       General Comments      Exercises     Assessment/Plan    PT Assessment Patient needs continued PT services  PT Problem List Decreased strength;Decreased activity tolerance;Decreased balance;Decreased mobility;Decreased coordination;Decreased knowledge of use of DME;Pain       PT Treatment Interventions DME instruction;Gait training;Functional mobility training;Therapeutic activities;Balance training;Neuromuscular re-education;Patient/family education    PT Goals (Current goals can be found in the Care Plan section)  Acute Rehab PT Goals Patient Stated Goal: return to independence PT Goal Formulation: With patient Time For Goal Achievement: 02/08/18 Potential to Achieve Goals: Good    Frequency Min 4X/week   Barriers to discharge        Co-evaluation               AM-PAC PT "6 Clicks" Mobility  Outcome Measure  Help needed turning from your back to your side while in a flat bed without using bedrails?: A Lot Help needed moving from lying on your back to sitting on the side of a flat bed without using bedrails?: A Lot Help needed moving to and from a bed to a chair (including a wheelchair)?: A Lot Help needed standing up from a chair using your arms (e.g., wheelchair or bedside chair)?: Total Help needed to walk in hospital room?: Total Help needed climbing 3-5 steps with a railing? : Total 6 Click Score: 9    End of Session   Activity Tolerance: Patient tolerated treatment well Patient left: in bed;with call bell/phone within reach;with bed alarm set Nurse Communication: Mobility status PT Visit Diagnosis: Other abnormalities of gait and mobility (R26.89);Other symptoms and signs involving the nervous system (R29.898);Hemiplegia and hemiparesis;Pain Hemiplegia - Right/Left: Left Hemiplegia - dominant/non-dominant: Non-dominant Hemiplegia - caused by: Cerebral infarction Pain - Right/Left: Left Pain - part of body: Knee    Time: [id number] PT Time Calculation (min) (ACUTE ONLY): 23 min   Charges:   PT Evaluation $PT Eval Moderate Complexity: 1 Mod PT Treatments $Therapeutic Activity: 8-22 mins        01/25/2018  Wayne Griffin, PT Acute Rehabilitation Services [phone]  (pager) [phone]  (office)  Wayne Griffin 01/25/2018, 5:42 PM

## 2018-01-26 DIAGNOSIS — I6300 Cerebral infarction due to thrombosis of unspecified precerebral artery: Secondary | ICD-10-CM

## 2018-01-26 LAB — CBC
HCT: 38.8 % — ABNORMAL LOW (ref 39.0–52.0)
HEMOGLOBIN: 12.2 g/dL — AB (ref 13.0–17.0)
MCH: 26.9 pg (ref 26.0–34.0)
MCHC: 31.4 g/dL (ref 30.0–36.0)
MCV: 85.7 fL (ref 80.0–100.0)
PLATELETS: 105 10*3/uL — AB (ref 150–400)
RBC: 4.53 MIL/uL (ref 4.22–5.81)
RDW: 14.8 % (ref 11.5–15.5)
WBC: 7.3 10*3/uL (ref 4.0–10.5)
nRBC: 0 % (ref 0.0–0.2)

## 2018-01-26 LAB — BASIC METABOLIC PANEL
Anion gap: 11 (ref 5–15)
BUN: 21 mg/dL (ref 8–23)
CO2: 22 mmol/L (ref 22–32)
Calcium: 8.3 mg/dL — ABNORMAL LOW (ref 8.9–10.3)
Chloride: 106 mmol/L (ref 98–111)
Creatinine, Ser: 1.69 mg/dL — ABNORMAL HIGH (ref 0.61–1.24)
GFR calc Af Amer: 46 mL/min — ABNORMAL LOW (ref >60)
GFR, EST NON AFRICAN AMERICAN: 40 mL/min — AB (ref >60)
Glucose, Bld: 94 mg/dL (ref 70–99)
Potassium: 3.7 mmol/L (ref 3.5–5.1)
Sodium: 139 mmol/L (ref 135–145)

## 2018-01-26 NOTE — Progress Notes (Signed)
PT Progress Note for Charges    01/26/18 1500  PT General Charges  $$ ACUTE PT VISIT 1 Visit  PT Treatments  $Therapeutic Activity 23-37 mins  Deborah ChalkJennifer Lucienne Sawyers, [place]PT, DPT  Acute Rehabilitation Services Pager [phone] Office [phone]

## 2018-01-26 NOTE — Progress Notes (Signed)
STROKE TEAM PROGRESS NOTE   SUBJECTIVE (INTERVAL HISTORY) No one is at the bedside.  Overall his condition is unchanged. He still has dysarthria and left hemiparesis.  X-rays of the hip) pelvis did not show any fracture. X-ray of the left knee showed degenerative changes from arthritis and suprapatellar joint effusion  OBJECTIVE Temp:  [97.8 F (36.6 C)-98.6 F (37 C)] 98.2 F (36.8 C) (12/07 1130) Pulse Rate:  [72-85] 85 (12/07 1130) Cardiac Rhythm: Normal sinus rhythm (12/07 0705) Resp:  [16-18] 18 (12/07 1130) BP: (146-160)/(69-100) 146/69 (12/07 1130) SpO2:  [95 %-98 %] 97 % (12/07 1130)  Recent Labs  Lab 01/24/18 0831  GLUCAP 156*   Recent Labs  Lab 01/24/18 0836 01/24/18 0840 01/25/18 0402 01/26/18 0504  NA 140 140 139 139  K 4.0 3.9 3.9 3.7  CL 105 104 107 106  CO2 20*  --  21* 22  GLUCOSE 159* 159* 108* 94  BUN 18 22 18 21   CREATININE 1.68* 1.50* 1.36* 1.69*  CALCIUM 9.1  --  8.6* 8.3*   Recent Labs  Lab 01/24/18 0836  AST 25  ALT 17  ALKPHOS 50  BILITOT 1.0  PROT 7.1  ALBUMIN 3.8   Recent Labs  Lab 01/24/18 0836 01/24/18 0840 01/25/18 0402 01/26/18 0504  WBC 15.7*  --  9.4 7.3  NEUTROABS 13.3*  --   --   --   HGB 14.6 15.6 14.0 12.2*  HCT 46.4 46.0 42.6 38.8*  MCV 85.6  --  83.5 85.7  PLT PLATELET CLUMPS NOTED ON SMEAR, COUNT APPEARS DECREASED  --  104* 105*   No results for input(s): CKTOTAL, CKMB, CKMBINDEX, TROPONINI in the last 168 hours. Recent Labs    01/24/18 0836  LABPROT 13.5  INR 1.04   No results for input(s): COLORURINE, LABSPEC, PHURINE, GLUCOSEU, HGBUR, BILIRUBINUR, KETONESUR, PROTEINUR, UROBILINOGEN, NITRITE, LEUKOCYTESUR in the last 72 hours.  Invalid input(s): APPERANCEUR     Component Value Date/Time   CHOL 172 01/25/2018 0402   TRIG 107 01/25/2018 0402   HDL 36 (L) 01/25/2018 0402   CHOLHDL 4.8 01/25/2018 0402   VLDL 21 01/25/2018 0402   LDLCALC 115 (H) 01/25/2018 0402   Lab Results  Component Value Date    HGBA1C 5.3 01/25/2018   No results found for: LABOPIA, COCAINSCRNUR, LABBENZ, AMPHETMU, THCU, LABBARB  No results for input(s): ETH in the last 168 hours.  I have personally reviewed the radiological images below and agree with the radiology interpretations.  Ct Angio Head W Or Wo Contrast  Result Date: 01/24/2018 CLINICAL DATA:  Left-sided weakness.  Code stroke. EXAM: CT ANGIOGRAPHY HEAD AND NECK CT PERFUSION BRAIN TECHNIQUE: Multidetector CT imaging of the head and neck was performed using the standard protocol during bolus administration of intravenous contrast. Multiplanar CT image reconstructions and MIPs were obtained to evaluate the vascular anatomy. Carotid stenosis measurements (when applicable) are obtained utilizing NASCET criteria, using the distal internal carotid diameter as the denominator. Multiphase CT imaging of the brain was performed following IV bolus contrast injection. Subsequent parametric perfusion maps were calculated using RAPID software. CONTRAST:  100mL ISOVUE-370 IOPAMIDOL (ISOVUE-370) INJECTION 76% COMPARISON:  Head CT earlier same day. FINDINGS: CTA NECK FINDINGS Aortic arch: Aortic atherosclerosis. No aneurysm or dissection. Left vertebral artery originates from the arch. Right carotid system: Common carotid artery is widely patent to the bifurcation region. Mild soft and calcified plaque at the carotid bifurcation and ICA bulb but no stenosis. Cervical ICA widely patent. The patient has the  unusual variation of a small branch vessel from the cervical ICA that appears to serve external carotid territory. Left carotid system: Common carotid artery widely patent to the bifurcation region. Soft and calcified plaque at the ICA bifurcation. Minimal diameter at the distal bulb is 4 mm. Compared to a more distal cervical ICA diameter of 5 mm, this indicates only a 20% stenosis. Vertebral arteries: Right vertebral artery is dominant. Right vertebral artery origin is widely patent.  The vessel is widely patent through the cervical region to the foramen magnum. Left vertebral artery arises from the arch. This is a small vessel that appears normally patent through the cervical region to the foramen magnum. Skeleton: Ordinary cervical spondylosis. Other neck: No mass or lymphadenopathy. Upper chest: Mild apical scarring.  No active process. Review of the MIP images confirms the above findings CTA HEAD FINDINGS Anterior circulation: Both internal carotid arteries are patent through the skull base and siphon regions. There is atherosclerotic change within the siphon regions but no stenosis greater than 30% suspected. The anterior and middle cerebral vessels are patent. No large or medium vessel occlusion. There is 50% stenosis of the proximal M2 segment on the right immediately past the origin the temporal branch. I do not see any occluded or missing branches. Posterior circulation: Both vertebral arteries are patent through the foramen magnum to the basilar. The left is diminutive. No basilar stenosis. Posterior circulation branch vessels are patent. Right PCA has fetal origin from the anterior circulation. Venous sinuses: Patent and normal. Anatomic variants: None other significant. Delayed phase: No abnormal enhancement. Review of the MIP images confirms the above findings CT Brain Perfusion Findings: CBF (<30%) Volume: 0mL Perfusion (Tmax>6.0s) volume: 0mL Mismatch Volume: 0mL Infarction Location:None IMPRESSION: Negative perfusion study. Atherosclerosis of the aortic arch. Atherosclerosis at both carotid bifurcations and ICA bulb regions. No stenosis on the right. 20% stenosis in the left ICA bulb. Intracranial atherosclerotic irregularity. 50% stenosis of the proximal M2 segment on the right immediately past the anterior temporal branch. The study does not show any occluded large or medium vessels. These results were communicated to Dr. Wilford Corner At 9:14 amon 12/5/2019by text page via the [facility]  messaging system. Electronically Signed   By: Paulina Fusi M.D.   On: 01/24/2018 09:15   Ct Angio Neck W And/or Wo Contrast  Result Date: 01/24/2018 CLINICAL DATA:  Left-sided weakness.  Code stroke. EXAM: CT ANGIOGRAPHY HEAD AND NECK CT PERFUSION BRAIN TECHNIQUE: Multidetector CT imaging of the head and neck was performed using the standard protocol during bolus administration of intravenous contrast. Multiplanar CT image reconstructions and MIPs were obtained to evaluate the vascular anatomy. Carotid stenosis measurements (when applicable) are obtained utilizing NASCET criteria, using the distal internal carotid diameter as the denominator. Multiphase CT imaging of the brain was performed following IV bolus contrast injection. Subsequent parametric perfusion maps were calculated using RAPID software. CONTRAST:  ISOVUE-370 IOPAMIDOL (ISOVUE-370) INJECTION 76% COMPARISON:  Head CT earlier same day. FINDINGS: CTA NECK FINDINGS Aortic arch: Aortic atherosclerosis. No aneurysm or dissection. Left vertebral artery originates from the arch. Right carotid system: Common carotid artery is widely patent to the bifurcation region. Mild soft and calcified plaque at the carotid bifurcation and ICA bulb but no stenosis. Cervical ICA widely patent. The patient has the unusual variation of a small branch vessel from the cervical ICA that appears to serve external carotid territory. Left carotid system: Common carotid artery widely patent to the bifurcation region. Soft and calcified plaque at the  ICA bifurcation. Minimal diameter at the distal bulb is 4 mm. Compared to a more distal cervical ICA diameter of 5 mm, this indicates only a 20% stenosis. Vertebral arteries: Right vertebral artery is dominant. Right vertebral artery origin is widely patent. The vessel is widely patent through the cervical region to the foramen magnum. Left vertebral artery arises from the arch. This is a small vessel that appears normally  patent through the cervical region to the foramen magnum. Skeleton: Ordinary cervical spondylosis. Other neck: No mass or lymphadenopathy. Upper chest: Mild apical scarring.  No active process. Review of the MIP images confirms the above findings CTA HEAD FINDINGS Anterior circulation: Both internal carotid arteries are patent through the skull base and siphon regions. There is atherosclerotic change within the siphon regions but no stenosis greater than 30% suspected. The anterior and middle cerebral vessels are patent. No large or medium vessel occlusion. There is 50% stenosis of the proximal M2 segment on the right immediately past the origin the temporal branch. I do not see any occluded or missing branches. Posterior circulation: Both vertebral arteries are patent through the foramen magnum to the basilar. The left is diminutive. No basilar stenosis. Posterior circulation branch vessels are patent. Right PCA has fetal origin from the anterior circulation. Venous sinuses: Patent and normal. Anatomic variants: None other significant. Delayed phase: No abnormal enhancement. Review of the MIP images confirms the above findings CT Brain Perfusion Findings: CBF (<30%) Volume: 0mL Perfusion (Tmax>6.0s) volume: 0mL Mismatch Volume: 0mL Infarction Location:None IMPRESSION: Negative perfusion study. Atherosclerosis of the aortic arch. Atherosclerosis at both carotid bifurcations and ICA bulb regions. No stenosis on the right. 20% stenosis in the left ICA bulb. Intracranial atherosclerotic irregularity. 50% stenosis of the proximal M2 segment on the right immediately past the anterior temporal branch. The study does not show any occluded large or medium vessels. These results were communicated to Dr. Wilford Corner At 9:14 amon 12/5/2019by text page via the [facility] messaging system. Electronically Signed   By: Paulina Fusi M.D.   On: 01/24/2018 09:15   Mr Brain Wo Contrast  Result Date: 01/25/2018 CLINICAL DATA:  Intermittent  left-sided weakness.  Fall. EXAM: MRI HEAD WITHOUT CONTRAST TECHNIQUE: Multiplanar, multiecho pulse sequences of the brain and surrounding structures were obtained without intravenous contrast. COMPARISON:  CTA head and neck and CT perfusion 01/24/2018 FINDINGS: Brain: The diffusion-weighted images demonstrate acute/subacute infarct involving the posterior aspect of the right lentiform nucleus, of the posterior limb right internal capsule, and posterior body of the right caudate. Maximal dimension of the infarct territory is 2.1 cm. No other acute infarct is present. An area of remote hemorrhage is present in the right lentiform nucleus, just anterior to the acute infarct. T2 signal changes are associated with the acute/subacute infarct. Multiple other remote nonhemorrhagic lacunar infarcts are present in the basal ganglia bilaterally. Remote lacunar infarcts extend into the corona radiata. Remote lacunar infarcts are also present the thalami bilaterally. Remote lacunar infarcts are present within the cerebellum bilaterally. Brainstem is unremarkable. Internal auditory canals are within normal limits bilaterally. Ventricles are proportionate to the degree of atrophy. No significant extra-axial fluid collection is present. Vascular: Flow is present in the major intracranial arteries. Skull and upper cervical spine: Skull base is within normal limits. Degenerative disc disease present at C2-3. Craniocervical junction is otherwise normal. Sinuses/Orbits: Mild mucosal thickening is present in the left maxillary sinus, bilateral sphenoid sinuses and ethmoid air cells. Mucosal thickening extends into the left frontal sinus. No fluid levels  are present. Mastoid air cells are clear. Globes and orbits are within normal limits bilaterally. IMPRESSION: 1. Acute/subacute nonhemorrhagic infarct involving the posterior right lentiform nucleus, posterior limb right internal capsule, and dorsal caudate. 2. Remote hemorrhagic  infarct of the right lentiform nucleus just anterior to the area of acute/subacute infarction. 3. Multiple other remote lacunar infarcts are present within the basal ganglia bilaterally, the thalami bilaterally, and the cerebellum bilaterally. Electronically Signed   By: Marin Roberts M.D.   On: 01/25/2018 07:46   Ct Cerebral Perfusion W Contrast  Result Date: 01/24/2018 CLINICAL DATA:  Left-sided weakness.  Code stroke. EXAM: CT ANGIOGRAPHY HEAD AND NECK CT PERFUSION BRAIN TECHNIQUE: Multidetector CT imaging of the head and neck was performed using the standard protocol during bolus administration of intravenous contrast. Multiplanar CT image reconstructions and MIPs were obtained to evaluate the vascular anatomy. Carotid stenosis measurements (when applicable) are obtained utilizing NASCET criteria, using the distal internal carotid diameter as the denominator. Multiphase CT imaging of the brain was performed following IV bolus contrast injection. Subsequent parametric perfusion maps were calculated using RAPID software. CONTRAST:  ISOVUE-370 IOPAMIDOL (ISOVUE-370) INJECTION 76% COMPARISON:  Head CT earlier same day. FINDINGS: CTA NECK FINDINGS Aortic arch: Aortic atherosclerosis. No aneurysm or dissection. Left vertebral artery originates from the arch. Right carotid system: Common carotid artery is widely patent to the bifurcation region. Mild soft and calcified plaque at the carotid bifurcation and ICA bulb but no stenosis. Cervical ICA widely patent. The patient has the unusual variation of a small branch vessel from the cervical ICA that appears to serve external carotid territory. Left carotid system: Common carotid artery widely patent to the bifurcation region. Soft and calcified plaque at the ICA bifurcation. Minimal diameter at the distal bulb is 4 mm. Compared to a more distal cervical ICA diameter of 5 mm, this indicates only a 20% stenosis. Vertebral arteries: Right vertebral artery  is dominant. Right vertebral artery origin is widely patent. The vessel is widely patent through the cervical region to the foramen magnum. Left vertebral artery arises from the arch. This is a small vessel that appears normally patent through the cervical region to the foramen magnum. Skeleton: Ordinary cervical spondylosis. Other neck: No mass or lymphadenopathy. Upper chest: Mild apical scarring.  No active process. Review of the MIP images confirms the above findings CTA HEAD FINDINGS Anterior circulation: Both internal carotid arteries are patent through the skull base and siphon regions. There is atherosclerotic change within the siphon regions but no stenosis greater than 30% suspected. The anterior and middle cerebral vessels are patent. No large or medium vessel occlusion. There is 50% stenosis of the proximal M2 segment on the right immediately past the origin the temporal branch. I do not see any occluded or missing branches. Posterior circulation: Both vertebral arteries are patent through the foramen magnum to the basilar. The left is diminutive. No basilar stenosis. Posterior circulation branch vessels are patent. Right PCA has fetal origin from the anterior circulation. Venous sinuses: Patent and normal. Anatomic variants: None other significant. Delayed phase: No abnormal enhancement. Review of the MIP images confirms the above findings CT Brain Perfusion Findings: CBF (<30%) Volume: 0mL Perfusion (Tmax>6.0s) volume: 0mL Mismatch Volume: 0mL Infarction Location:None IMPRESSION: Negative perfusion study. Atherosclerosis of the aortic arch. Atherosclerosis at both carotid bifurcations and ICA bulb regions. No stenosis on the right. 20% stenosis in the left ICA bulb. Intracranial atherosclerotic irregularity. 50% stenosis of the proximal M2 segment on the right immediately past  the anterior temporal branch. The study does not show any occluded large or medium vessels. These results were communicated to  Dr. Wilford Corner At 9:14 amon 12/5/2019by text page via the [facility] messaging system. Electronically Signed   By: Paulina Fusi M.D.   On: 01/24/2018 09:15   Dg Chest Port 1 View  Result Date: 01/25/2018 CLINICAL DATA:  Respiratory difficulty EXAM: PORTABLE CHEST 1 VIEW COMPARISON:  02/19/2011 FINDINGS: Cardiac shadow is mildly enlarged but accentuated by the portable technique. The lungs are well aerated bilaterally without focal infiltrate or sizable effusion. No acute bony abnormality is seen. IMPRESSION: No active disease. Electronically Signed   By: Alcide Clever M.D.   On: 01/25/2018 07:31   Dg Knee Left Port  Result Date: 01/25/2018 CLINICAL DATA:  Post multiple falls with left knee pain and swelling. EXAM: PORTABLE LEFT KNEE - 1-2 VIEW COMPARISON:  None. FINDINGS: No definite fracture or dislocation. Moderate in size high density suprapatellar joint effusion. Overlying soft tissue swelling anterior to the patella. Chronic appearing arthritic changes in all 3 compartments with joint space narrowing, subchondral sclerosis, chondrocalcinosis and small osteophyte formation. IMPRESSION: No fractures seen radiographically. High density suprapatellar joint effusion which is suggestive of radio occult fracture or internal derangement of the knee. 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 Electronically Signed   By: Ted Mcalpine M.D.   On: 01/25/2018 11:32   Dg Hip Unilat With Pelvis 2-3 Views Left  Result Date: 01/25/2018 CLINICAL DATA:  Multiple recent falls due to her recent stroke. Generalize left lower extremity pain. No visible bruising. EXAM: DG HIP (WITH OR WITHOUT PELVIS) 2-3V LEFT COMPARISON:  Coronal and sagittal reconstructed images through the pelvis and hips from an abdominal CT scan of January 06, 2008 FINDINGS: The observed portions of the bony pelvis are subjectively adequately mineralized. There is no lytic or blastic lesion. There is  contrast within the urinary bladder. There multiple pelvic phleboliths. AP and lateral views of the left hip reveal mild symmetric narrowing of the left hip joint space. Small spurs arise from the articular margins of the acetabulum. The femoral head, neck, intertrochanteric, and immediate subtrochanteric regions are normal. IMPRESSION: There is no acute bony abnormality of the left hip or visualized portions of the pelvis. Mild symmetric narrowing of the left hip joint space. Electronically Signed   By: David  [place] M.D.   On: 01/25/2018 11:28   Ct Head Code Stroke Wo Contrast  Result Date: 01/24/2018 CLINICAL DATA:  Code stroke.  Left-sided weakness.  Code stroke. EXAM: CT HEAD WITHOUT CONTRAST TECHNIQUE: Contiguous axial images were obtained from the base of the skull through the vertex without intravenous contrast. COMPARISON:  None. FINDINGS: Brain: Age related atrophy. Mild chronic small-vessel ischemic change of the hemispheric white matter. No sign of acute parenchymal infarction. Old small vessel infarctions affect the thalami, basal ganglia and external capsule regions. No mass, hemorrhage, hydrocephalus or extra-axial collection. Vascular: There is atherosclerotic calcification of the major vessels at the base of the brain. 1 could question a small hyperdense focus in the right MCA bifurcation region. This is certainly not definite evidence acute embolic disease. Skull: Normal Sinuses/Orbits: Clear/normal Other: None ASPECTS ([place]berta Stroke Program Early CT Score) - Ganglionic level infarction (caudate, lentiform nuclei, internal capsule, insula, M1-M3 cortex): 7 - Supraganglionic infarction (M4-M6 cortex): 3 Total score (0-10 with 10 being normal): 10 IMPRESSION: 1. No acute parenchymal finding. Chronic small-vessel ischemic changes. 2. ASPECTS is 10. 3. One  could question a focal hyperdensity at the right MCA bifurcation, but this is not definite. 4. This result was communicated by pager to the  stroke service. Electronically Signed   By: Paulina Fusi M.D.   On: 01/24/2018 08:50   TTE pending   PHYSICAL EXAM  Temp:  [97.8 F (36.6 C)-98.6 F (37 C)] 98.2 F (36.8 C) (12/07 1130) Pulse Rate:  [72-85] 85 (12/07 1130) Resp:  [16-18] 18 (12/07 1130) BP: (146-160)/(69-100) 146/69 (12/07 1130) SpO2:  [95 %-98 %] 97 % (12/07 1130)  General - obese elderly Caucasian male   Ophthalmologic - fundi not visualized due to noncooperation.  Cardiovascular - Regular rate and rhythm.  Mental Status -  Level of arousal and orientation to time, place, and person were intact. Language including expression, naming, repetition, comprehension was assessed and found intact, mild dysarthria  Cranial Nerves II - XII - II - Visual field intact OU. III, IV, VI - Extraocular movements intact. V - Facial sensation intact bilaterally. VII - mild left facial droop. VIII - Hearing & vestibular intact bilaterally. X - Palate elevates symmetrically, mild dysarthria. XI - Chin turning & shoulder shrug intact bilaterally. XII - Tongue protrusion intact.  Motor Strength - The patient's strength was normal in RUE and RLE but left UE 1/5 proximal and 0/5 distally, LLE 2/5 proximal and distal.  Bulk was normal and fasciculations were absent.   Motor Tone - Muscle tone was assessed at the neck and appendages and was mildly elevated at LUE and LLE.  Reflexes - The patient's reflexes were symmetrical in all extremities and he had left babinski.  Sensory - Light touch, temperature/pinprick were assessed and were symmetrical.    Coordination - The patient had normal movements in the right hand with no ataxia or dysmetria.  Tremor was present on the right hand, resting and action tremor   Gait and Station - deferred.   ASSESSMENT/PLAN Wayne Griffin is a 73 y.o. male with history of left knee surgery many years ago, not seeing doctor for years admitted for left sided weakness.TPA given.    Stroke:   right BG/CR infarct likely secondary to small vessel disease    Resultant left sided weakness and dysarthria  MRI  Right BG/CR infarct  CT no acute abnormality  CTA head and neck right M1 50% stenosis, no LVO  2D Echo  Normal ejection fraction of 55-60% with mild LVH.  LDL 115  HgbA1c 5.3  lovenox for VTE prophylaxis  On diet  No antithrombotic prior to admission, now on aspirin 81 mg daily and clopidogrel 75 mg daily. DAPT for 3 weeks and then ASA alone  Patient counseled to be compliant with his antithrombotic medications  Ongoing aggressive stroke risk factor management  Therapy recommendations:  Pending   Disposition:  Pending   Hypertension . Stable  . Now off cleviprex . Add amlodipine 10  BP goal < 180/105  Long term BP goal normotensive  Hyperlipidemia  Home meds:  none   LDL 115, goal < 70  Now on lipitor 40  Continue statin at discharge  Left knee / hip pain  Fall x 2 at home  Left hip and knee X-ray to rule out fracture  Left knee swelling with tenderness on palpation   Left knee surgery many years ago  Tramadol PRN  Do not feel septic arthritis   Will consider empiric ?? gout treatment ?? If no fracture  Other Stroke Risk Factors  Advanced age  Obesity,  There is no height or weight on file to calculate BMI.   Other Active Problems  Elevated Cre 1.68->1.50->1.36  Leukocytosis WBC 15.7 -> 9.4 - CXR neg, afebrile   Hospital day # 2 I have personally examined this patient, reviewed notes, independently viewed imaging studies, participated in medical decision making and plan of care.ROS completed by me personally and pertinent positives fully documented  I have made any additions or clarifications directly to the above note. Plan inpatient rehabilitation consult and transfer there over the next few days. Continue present treatment. Greater than 50% time during this 25 minute visit was spent on coordination of care about his stroke  and pending treatment plan and care Delia Heady, MD Medical Director [facility] Pager: [phone] 01/26/2018 2:10 PM  Delia Heady, MD Stroke Neurology 01/26/2018 2:05 PM    To contact Stroke Continuity provider, please refer to [email or web address]. After hours, contact General Neurology

## 2018-01-26 NOTE — Progress Notes (Signed)
Physical Therapy Treatment Patient Details Name: Wayne Griffin MRN: [id number] DOB: Dec 16, 1945 Today's Date: 01/26/2018    History of Present Illness Pt is a 72 y.o. male  with no pertinent history secondary as pt hasn't seen PCP in a very long time.  Per son patient had a period of left-sided arm, leg weakness causing x2 falls and left facial droop PTA.  Pt given tPA on 01/24/2018. MRI shows Acute/subacute infarct involving the posterior right lentiform nucleus, posterior limb right internal capsule, and dorsal caudate with multiple other remote lacunar infarcts. Imaging of L hip/knee completed with no acute fractures.    PT Comments    Pt demonstrates slight progression towards functional mobility goals. Pt is overall mod A with bed mobility, and mod-max A x2 with transfers. Pt demonstrates decreased midline orientation in standing with a L lateral lean. However, after multimodal cueing, pt is able to orient back to midline. Pt states initial light headedness during sitting, which subsided after a few moments. Pt states continued pain of L hip that occurs during functional movements. Continue to recommended CIR at this time. Pt would benefit from continued acute PT in order to increase balance, strength, and functional mobility.    BP taken at beginning of session in supine measured 160/84 mmHg   Follow Up Recommendations  CIR     Equipment Recommendations  Other (comment)(TBA)    Recommendations for Other Services Rehab consult     Precautions / Restrictions Precautions Precautions: Fall Precaution Comments: L hemi Restrictions Weight Bearing Restrictions: No    Mobility  Bed Mobility Overal bed mobility: Needs Assistance Bed Mobility: Supine to Sit;Sit to Supine     Supine to sit: Mod assist Sit to supine: Mod assist   General bed mobility comments: Assistance with LLE to bring EOB and initial trunk stability required to achieve sitting balance during supine to sit. Pt  requires assistance with scooting with use of the bed pad to bring hips EOB. When returning from sit to supine, pt requires assistance of LLE. Multimodal cueing given throughout for sequencing.  Transfers Overall transfer level: Needs assistance Equipment used: 2 person hand held assist Transfers: Sit to/from Stand;Lateral/Scoot Transfers Sit to Stand: Mod assist;Max assist;+2 physical assistance        Lateral/Scoot Transfers: Mod assist;+2 physical assistance General transfer comment: Pt requires mod-max A x2 during sit to stand. Pt relies on RLE/RUE throughout. Pt requires assistance with power initiation and stability throughout. Pt demonstrates increased L lateral lean, and requires multimodal cueing for correction to midline. Blocking of LLE required throughout secondary to buckling at the knee 2 person mod/max A required for R lateral scooting three times towards head of bed with use of bed pad.  Ambulation/Gait                 Stairs             Wheelchair Mobility    Modified Rankin (Stroke Patients Only) Modified Rankin (Stroke Patients Only) Pre-Morbid Rankin Score: No symptoms Modified Rankin: Severe disability     Balance Overall balance assessment: Needs assistance Sitting-balance support: Single extremity supported;Feet supported Sitting balance-Leahy Scale: Poor Sitting balance - Comments: Requires use of RUE during sitting. Min guard throughout sitting, with periods of min A for stability.   Standing balance support: Bilateral upper extremity supported;During functional activity Standing balance-Leahy Scale: Poor Standing balance comment: Pt requires 2 person mod-max A in order to facilitate lateral and anterior weight shifts. Pt shows decreased awareness of midline,  requiring multimodal cueing for correction.                             Cognition Arousal/Alertness: Awake/alert Behavior During Therapy: WFL for tasks  assessed/performed Overall Cognitive Status: Impaired/Different from baseline Area of Impairment: Following commands;Attention                   Current Attention Level: Selective   Following Commands: Follows one step commands with increased time;Follows one step commands consistently              Exercises Other Exercises Other Exercises: sit to stand x 3    General Comments General comments (skin integrity, edema, etc.): Skin tear L forearm after sit to stand completion.       Pertinent Vitals/Pain Pain Assessment: Faces Faces Pain Scale: Hurts little more Pain Location: L knee/hip Pain Descriptors / Indicators: Aching;Discomfort;Sore;Other (Comment)(during movement) Pain Intervention(s): Monitored during session;Repositioned    Home Living                      Prior Function            PT Goals (current goals can now be found in the care plan section) Acute Rehab PT Goals Patient Stated Goal: return to independence PT Goal Formulation: With patient Time For Goal Achievement: 02/08/18 Potential to Achieve Goals: Good Progress towards PT goals: Progressing toward goals    Frequency    Min 4X/week      PT Plan Current plan remains appropriate    Co-evaluation              AM-PAC PT "6 Clicks" Mobility   Outcome Measure  Help needed turning from your back to your side while in a flat bed without using bedrails?: A Lot Help needed moving from lying on your back to sitting on the side of a flat bed without using bedrails?: A Lot Help needed moving to and from a bed to a chair (including a wheelchair)?: A Lot Help needed standing up from a chair using your arms (e.g., wheelchair or bedside chair)?: A Lot Help needed to walk in hospital room?: Total Help needed climbing 3-5 steps with a railing? : Total 6 Click Score: 10    End of Session Equipment Utilized During Treatment: Gait belt Activity Tolerance: Patient tolerated treatment  well Patient left: in bed;with call bell/phone within reach;with bed alarm set;with nursing/sitter in room Nurse Communication: Mobility status PT Visit Diagnosis: Other abnormalities of gait and mobility (R26.89);Unsteadiness on feet (R26.81)     Time: [id number] PT Time Calculation (min) (ACUTE ONLY): 31 min  Charges:  $Therapeutic Activity: 23-37 mins                    Wayne BookbinderBeau Griffin, SPT Acute Rehab [phone] (pager) [phone] (office)    Venus Ruhe 01/26/2018, 4:12 PM

## 2018-01-26 NOTE — Progress Notes (Signed)
Rehab Admissions Coordinator Note:  Per PT, OT, and SLP recommendation, Patient was screened by Nanine MeansKelly Rickie Gange for appropriateness for an Inpatient Acute Rehab Consult.  At this time, we are recommending Inpatient Rehab consult. AC will contact MD regarding IP Rehab Consult request.   Nanine MeansKelly Waqas Bruhl 01/26/2018, 9:54 AM  I can be reached at [phone].

## 2018-01-27 DIAGNOSIS — R269 Unspecified abnormalities of gait and mobility: Secondary | ICD-10-CM

## 2018-01-27 DIAGNOSIS — I69359 Hemiplegia and hemiparesis following cerebral infarction affecting unspecified side: Secondary | ICD-10-CM

## 2018-01-27 DIAGNOSIS — I69398 Other sequelae of cerebral infarction: Secondary | ICD-10-CM

## 2018-01-27 LAB — BASIC METABOLIC PANEL
Anion gap: 8 (ref 5–15)
BUN: 16 mg/dL (ref 8–23)
CO2: 24 mmol/L (ref 22–32)
Calcium: 8.1 mg/dL — ABNORMAL LOW (ref 8.9–10.3)
Chloride: 106 mmol/L (ref 98–111)
Creatinine, Ser: 1.3 mg/dL — ABNORMAL HIGH (ref 0.61–1.24)
GFR calc Af Amer: >60 mL/min (ref >60)
GFR calc non Af Amer: 55 mL/min — ABNORMAL LOW (ref >60)
Glucose, Bld: 98 mg/dL (ref 70–99)
POTASSIUM: 3.8 mmol/L (ref 3.5–5.1)
Sodium: 138 mmol/L (ref 135–145)

## 2018-01-27 LAB — CBC
HCT: 34.5 % — ABNORMAL LOW (ref 39.0–52.0)
Hemoglobin: 11.1 g/dL — ABNORMAL LOW (ref 13.0–17.0)
MCH: 27.3 pg (ref 26.0–34.0)
MCHC: 32.2 g/dL (ref 30.0–36.0)
MCV: 85 fL (ref 80.0–100.0)
Platelets: 101 10*3/uL — ABNORMAL LOW (ref 150–400)
RBC: 4.06 MIL/uL — AB (ref 4.22–5.81)
RDW: 14.6 % (ref 11.5–15.5)
WBC: 6.5 10*3/uL (ref 4.0–10.5)
nRBC: 0 % (ref 0.0–0.2)

## 2018-01-27 MED ORDER — SODIUM CHLORIDE 0.9 % IV BOLUS
250.0000 mL | Freq: Once | INTRAVENOUS | Status: AC
  Administered 2018-01-27: 250 mL via INTRAVENOUS

## 2018-01-27 MED ORDER — LORAZEPAM 0.5 MG PO TABS
0.5000 mg | ORAL_TABLET | Freq: Once | ORAL | Status: AC
  Administered 2018-01-27: 0.5 mg via ORAL
  Filled 2018-01-27: qty 1

## 2018-01-27 MED ORDER — LORAZEPAM BOLUS VIA INFUSION: 0.5000 mg | Freq: Once | INTRAVENOUS | Status: DC

## 2018-01-27 MED ORDER — LORAZEPAM 0.5 MG PO TABS: 0.5000 mg | ORAL_TABLET | Freq: Three times a day (TID) | ORAL | Status: DC | PRN

## 2018-01-27 NOTE — Progress Notes (Signed)
CTS pt after he reported increased numbness on the left side of his face. On exam he has equal sensation to light touch on both sides of his face as well as his arms. The pt then told me that he is just very anxious because his sister died from a stroke. He told me he has chronic anxiety and has breathing exercises he usually does but they don't seem to be helping. He had received Ativan for his MRI and stated that it really relieved his anxiety. Will give ativan 0.5 mg IV once now and order 0.5 mg PO Q8 hrs prn. Will also give 250 cc NS bolus over 2 hrs. Discussed plan with pt and his nurse who was at the bedside. Pt feels better.  Wayne Seeavid Racquelle Hyser PA-C [facility] Pager [phone] 01/27/2018, 5:03 PM

## 2018-01-27 NOTE — Progress Notes (Signed)
Patient called c/o numbness to left side of his face. He stated that "it feels different". Sent a page to inform practitioner.

## 2018-01-27 NOTE — Progress Notes (Signed)
STROKE TEAM PROGRESS NOTE   SUBJECTIVE (INTERVAL HISTORY) No family members present.  The patient feels he is making some improvement.  Dr. Pearlean Brownie discussed the possibility of inpatient rehabilitation at [facility].   OBJECTIVE Temp:  [97.8 F (36.6 C)-98.3 F (36.8 C)] 98.3 F (36.8 C) (12/08 1138) Pulse Rate:  [67-85] 85 (12/08 1249) Cardiac Rhythm: Normal sinus rhythm (12/08 0700) Resp:  [17-20] 17 (12/08 1138) BP: (142-167)/(58-80) 167/80 (12/08 1249) SpO2:  [94 %-99 %] 94 % (12/08 1249)  Recent Labs  Lab 01/24/18 0831  GLUCAP 156*   Recent Labs  Lab 01/24/18 0836 01/24/18 0840 01/25/18 0402 01/26/18 0504 01/27/18 0531  NA 140 140 139 139 138  K 4.0 3.9 3.9 3.7 3.8  CL 105 104 107 106 106  CO2 20*  --  21* 22 24  GLUCOSE 159* 159* 108* 94 98  BUN 18 22 18 21 16   CREATININE 1.68* 1.50* 1.36* 1.69* 1.30*  CALCIUM 9.1  --  8.6* 8.3* 8.1*   Recent Labs  Lab 01/24/18 0836  AST 25  ALT 17  ALKPHOS 50  BILITOT 1.0  PROT 7.1  ALBUMIN 3.8   Recent Labs  Lab 01/24/18 0836 01/24/18 0840 01/25/18 0402 01/26/18 0504 01/27/18 0531  WBC 15.7*  --  9.4 7.3 6.5  NEUTROABS 13.3*  --   --   --   --   HGB 14.6 15.6 14.0 12.2* 11.1*  HCT 46.4 46.0 42.6 38.8* 34.5*  MCV 85.6  --  83.5 85.7 85.0  PLT PLATELET CLUMPS NOTED ON SMEAR, COUNT APPEARS DECREASED  --  104* 105* 101*   No results for input(s): CKTOTAL, CKMB, CKMBINDEX, TROPONINI in the last 168 hours. No results for input(s): LABPROT, INR in the last 72 hours. No results for input(s): COLORURINE, LABSPEC, PHURINE, GLUCOSEU, HGBUR, BILIRUBINUR, KETONESUR, PROTEINUR, UROBILINOGEN, NITRITE, LEUKOCYTESUR in the last 72 hours.  Invalid input(s): APPERANCEUR     Component Value Date/Time   CHOL 172 01/25/2018 0402   TRIG 107 01/25/2018 0402   HDL 36 (L) 01/25/2018 0402   CHOLHDL 4.8 01/25/2018 0402   VLDL 21 01/25/2018 0402   LDLCALC 115 (H) 01/25/2018 0402   Lab Results  Component Value Date    HGBA1C 5.3 01/25/2018   No results found for: LABOPIA, COCAINSCRNUR, LABBENZ, AMPHETMU, THCU, LABBARB  No results for input(s): ETH in the last 168 hours.  I have personally reviewed the radiological images below and agree with the radiology interpretations.  Ct Angio Head W Or Wo Contrast Ct Angio Neck W And/or Wo Contrast Ct Cerebral Perfusion W Contrast 01/24/2018 IMPRESSION:  Negative perfusion study. Atherosclerosis of the aortic arch. Atherosclerosis at both carotid bifurcations and ICA bulb regions. No stenosis on the right. 20% stenosis in the left ICA bulb. Intracranial atherosclerotic irregularity. 50% stenosis of the proximal M2 segment on the right immediately past the anterior temporal branch.   Mr Brain Wo Contrast 01/25/2018 IMPRESSION:  1. Acute/subacute nonhemorrhagic infarct involving the posterior right lentiform nucleus, posterior limb right internal capsule, and dorsal caudate.  2. Remote hemorrhagic infarct of the right lentiform nucleus just anterior to the area of acute/subacute infarction.  3. Multiple other remote lacunar infarcts are present within the basal ganglia bilaterally, the thalami bilaterally, and the cerebellum bilaterally.     Dg Chest Port 1 View 01/25/2018 IMPRESSION:  No active disease.     Dg Knee Left Portable 01/25/2018 IMPRESSION:  No fractures seen radiographically. High density suprapatellar joint effusion which is suggestive  of radio occult fracture or internal derangement of the knee. 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    Dg Hip Unilat With Pelvis 2-3 Views Left 01/25/2018 IMPRESSION:  There is no acute bony abnormality of the left hip or visualized portions of the pelvis. Mild symmetric narrowing of the left hip joint space.   Ct Head Code Stroke Wo Contrast 01/24/2018 IMPRESSION:  1. No acute parenchymal finding. Chronic small-vessel ischemic changes.  2. ASPECTS is 10.   3. One could question a focal hyperdensity at the right MCA bifurcation, but this is not definite.  4. This result was communicated by pager to the stroke service.    Transthoracic Echocardiogram 01/25/2018 Study Conclusions - Left ventricle: The cavity size was normal. Wall thickness was   increased in a pattern of mild LVH. Systolic function was normal.   The estimated ejection fraction was in the range of 55% to 60%.   Doppler parameters are consistent with abnormal left ventricular   relaxation (grade 1 diastolic dysfunction). - Mitral valve: Calcified annulus. Mildly thickened leaflets . - Left atrium: The atrium was mildly dilated.     PHYSICAL EXAM  Temp:  [97.8 F (36.6 C)-98.3 F (36.8 C)] 98.3 F (36.8 C) (12/08 1138) Pulse Rate:  [67-85] 85 (12/08 1249) Resp:  [17-20] 17 (12/08 1138) BP: (142-167)/(58-80) 167/80 (12/08 1249) SpO2:  [94 %-99 %] 94 % (12/08 1249)  General - obese elderly Caucasian male   Ophthalmologic - fundi not visualized due to noncooperation.  Cardiovascular - Regular rate and rhythm.  Mental Status -  Level of arousal and orientation to time, place, and person were intact. Language including expression, naming, repetition, comprehension was assessed and found intact, mild dysarthria  Cranial Nerves II - XII - II - Visual field intact OU. III, IV, VI - Extraocular movements intact. V - Facial sensation intact bilaterally. VII - mild left facial droop. VIII - Hearing & vestibular intact bilaterally. X - Palate elevates symmetrically, mild dysarthria. XI - Chin turning & shoulder shrug intact bilaterally. XII - Tongue protrusion intact.  Motor Strength - The patient's strength was normal in RUE and RLE but left UE 1/5 proximal and 0/5 distally, LLE 2/5 proximal and distal.  Bulk was normal and fasciculations were absent.   Motor Tone - Muscle tone was assessed at the neck and appendages and was mildly elevated at LUE and LLE.  Reflexes -  The patient's reflexes were symmetrical in all extremities and he had left babinski.  Sensory - Light touch, temperature/pinprick were assessed and were symmetrical.    Coordination - The patient had normal movements in the right hand with no ataxia or dysmetria.  Tremor was present on the right hand, resting and action tremor   Gait and Station - deferred.   ASSESSMENT/PLAN Wayne Griffin is a 72 y.o. male with history of left knee surgery many years ago, not seeing doctor for years admitted for left sided weakness.TPA given.    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  LDL 115  HgbA1c 5.3  lovenox for VTE prophylaxis  On diet  No antithrombotic prior to admission, now on aspirin 81 mg daily and clopidogrel  75 mg daily. DAPT for 3 weeks and then ASA alone  Patient counseled to be compliant with his antithrombotic medications  Ongoing aggressive stroke risk factor management  Therapy recommendations:  CIR recommended - consult obtained - Dr Wynn BankerKirsteins  Disposition:  Pending   Hypertension . Stable  . Now off cleviprex . Add amlodipine 10  BP goal < 180/105  Long term BP goal normotensive  Hyperlipidemia  Home meds:  none   LDL 115, goal < 70  Now on lipitor 40  Continue statin at discharge  Left knee / hip pain  Fall x 2 at home  Left hip and knee X-ray to rule out fracture  Left knee swelling with tenderness on palpation   Left knee surgery many years ago  Tramadol PRN  Do not feel septic arthritis   Will consider empiric ?? gout treatment ?? If no fracture   Abnormal Lt knee xray (see above) - consider ortho consult  Other Stroke Risk Factors  Advanced age  Obesity, There is no height or weight on  file to calculate BMI.   Other Active Problems  Elevated Cre 1.68->1.50->1.36-> 1.69-> 1.30  Leukocytosis WBC 15.7 -> 9.4 -> 12.2 -> 11.1CXR neg, afebrile   Mild thrombocytopenia  Abnormal Lt knee xray (see above) - consider ortho consult   Hospital day # 3  Delton SeeDavid Rinehuls PA-C [facility] Pager [phone] 01/27/2018, 2:22 PM I have personally obtained history,examined this patient, reviewed notes, independently viewed imaging studies, participated in medical decision making and plan of care.ROS completed by me personally and pertinent positives fully documented  I have made any additions or clarifications directly to the above note. Agree with note above.   Delia HeadyPramod Diamonique Ruedas, MD Medical Director [facility] Pager: [phone] 01/27/2018 4:01 PM    To contact Stroke Continuity provider, please refer to [email or web address]. After hours, contact General Neurology

## 2018-01-27 NOTE — Consult Note (Signed)
Physical Medicine and Rehabilitation Consult Reason for Consult: Stroke rehabilitation Referring Phsyician: Wayne Griffin is an 72 y.o. male.   HPI: Relatively healthy 72 year old male who developed left-sided arm and leg weakness causing a fall on 01/24/2018 was brought to the ED with left facial droop and left hemiparesis.  TPA was administered.  CTA showed no large vessel occlusion.  Initial NIH stroke score of 8 placed on Cleviprex for blood pressure control.  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  Small vessel disease is the suspected etiology.  Dual antiplatelet therapy for 3 weeks then aspirin alone has been recommended.  Speech therapy evaluation found that the patient was tolerating thin liquids without signs of aspiration.  D3 thin liquids were recommended.  OT evaluation on 01/25/2018 demonstrated decline in function now requiring max assist for bed mobility and min assist to maintain static sitting mod assist for upper body ADLs and max to total for lower body ADLs.  Was independent prior to admission except used a cane.  Physical therapy evaluation on 01/25/2018 demonstrated max assist lateral transfers, gait was not tested.  X-ray of the left hip and left knee did not show any signs of fracture.  There were degenerative changes of the left knee noted with small suprapatellar joint effusion PT evaluation on 01/26/2018 still demonstrated mod/max assist +2 for transfers, ambulation was not attempted Review of Systems  Constitutional: Negative for chills, fever and weight loss.  HENT: Negative for congestion and hearing loss.   Eyes: Negative for blurred vision, double vision and redness.  Respiratory: Negative for cough, hemoptysis, sputum production, shortness of breath and wheezing.   Cardiovascular: Negative for chest pain and palpitations.  Gastrointestinal: Positive for constipation. Negative for abdominal pain, heartburn, nausea and  vomiting.  Genitourinary: Negative for dysuria.       Urinary incontinence using condom catheter  Musculoskeletal: Positive for falls and joint pain. Negative for myalgias.  Skin: Negative for itching and rash.  Neurological: Positive for focal weakness. Negative for dizziness, tingling, sensory change and speech change.  Endo/Heme/Allergies: Does not bruise/bleed easily.  Psychiatric/Behavioral: Negative for depression. The patient is not nervous/anxious.     History reviewed. No pertinent past medical history. History reviewed. No pertinent surgical history. Family History  Problem Relation Age of Onset  . Hypertension Mother   . Stroke Mother   . Hypertension Father    Social History:  reports that he has quit smoking. He has never used smokeless tobacco. He reports that he drank alcohol. He reports that he does not use drugs. Allergies: No Known Allergies No medications prior to admission.    Home: Home Living Family/patient expects to be discharged to:: Private residence Living Arrangements: Children(grandchildren) Available Help at Discharge: Family Type of Home: House Home Access: Stairs to enter Secretary/administrator of Steps: 2-3 Entrance Stairs-Rails: None Home Layout: One level Bathroom Shower/Tub: Health visitor: Standard Home Equipment: Medical laboratory scientific officer - single point  Lives With: Son  Functional History: Prior Function Vocation: Retired Comments: overall independent though had recently begun using SPC for ambulation Functional Status:  Mobility:          ADL:    Cognition: Cognition Overall Cognitive Status: Impaired/Different from baseline Arousal/Alertness: Awake/alert Orientation Level: Oriented X4 Attention: Divided Divided Attention: Appears intact Memory: Appears intact Awareness: Appears intact Problem Solving: Appears intact Executive Function: Reasoning, Self Monitoring, Self Correcting Reasoning: Appears intact Self  Monitoring: Appears intact Self Correcting: Appears intact Safety/Judgment: Appears intact Cognition Arousal/Alertness:  Awake/alert Behavior During Therapy: WFL for tasks assessed/performed Overall Cognitive Status: Impaired/Different from baseline Area of Impairment: Following commands, Attention Current Attention Level: Selective Following Commands: Follows one step commands with increased time, Follows one step commands consistently General Comments: pt with difficulty focusing on task at hand during session when provided with environmental distractions   Blood pressure (!) 158/75, pulse 71, temperature 98.2 F (36.8 C), resp. rate 18, weight 110 kg, SpO2 98 %. Physical Exam  Nursing note and vitals reviewed. Constitutional: He is oriented to person, place, and time.  HENT:  Head: Normocephalic and atraumatic.  Eyes: Pupils are equal, round, and reactive to light. Conjunctivae and EOM are normal.  Neck: Normal range of motion.  Cardiovascular: Normal rate, regular rhythm and normal heart sounds.  No murmur heard. Respiratory: Effort normal and breath sounds normal. No respiratory distress.  GI: Soft. Bowel sounds are normal. He exhibits no distension.  Musculoskeletal: He exhibits edema. He exhibits no tenderness or deformity.  Neurological: He is alert and oriented to person, place, and time.  Motor strength is 5/5 in the right deltoid, bicep, tricep, grip, hip flexor, knee extensor, ankle dorsiflexor 0/5 in the left deltoid bicep tricep grip to minus at the left hip knee extensor synergy otherwise 0/5 in left lower limb Sensation intact bilateral upper limb and lower limb to light touch Speech without dysarthria  Skin: Skin is warm and dry.  Psychiatric: He has a normal mood and affect.    Results for orders placed or performed during the hospital encounter of 01/24/18 (from the past 24 hour(s))  CBC     Status: Abnormal   Collection Time: 01/27/18  5:31 AM  Result Value  Ref Range   WBC 6.5 4.0 - 10.5 K/uL   RBC 4.06 (L) 4.22 - 5.81 MIL/uL   Hemoglobin 11.1 (L) 13.0 - 17.0 g/dL   HCT 16.1 (L) 09.6 - 04.5 %   MCV 85.0 80.0 - 100.0 fL   MCH 27.3 26.0 - 34.0 pg   MCHC 32.2 30.0 - 36.0 g/dL   RDW 40.9 81.1 - 91.4 %   Platelets 101 (L) 150 - 400 K/uL   nRBC 0.0 0.0 - 0.2 %  Basic metabolic panel     Status: Abnormal   Collection Time: 01/27/18  5:31 AM  Result Value Ref Range   Sodium 138 135 - 145 mmol/L   Potassium 3.8 3.5 - 5.1 mmol/L   Chloride 106 98 - 111 mmol/L   CO2 24 22 - 32 mmol/L   Glucose, Bld 98 70 - 99 mg/dL   BUN 16 8 - 23 mg/dL   Creatinine, Ser 7.82 (H) 0.61 - 1.24 mg/dL   Calcium 8.1 (L) 8.9 - 10.3 mg/dL   GFR calc non Af Amer 55 (L) >60 mL/min   GFR calc Af Amer >60 >60 mL/min   Anion gap 8 5 - 15   No results found.  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 Does the need for close, 24 hr/day medical supervision in concert with the patient's rehab needs make it unreasonable for this patient to be served in a less intensive setting? Yes 2. Co-Morbidities requiring supervision/potential complications: Anemia, CKD 2 3. Due to bladder management, bowel management, safety, skin/wound care, disease management, medication administration, pain management and patient education, does the patient require 24 hr/day rehab nursing? Yes 4. Does the patient require coordinated care of a physician, rehab nurse, PT (1-2 hrs/day, 5 days/week) and  OT (1-2 hrs/day, 5 days/week) to address physical and functional deficits in the context of the above medical diagnosis(es)? Yes Addressing deficits in the following areas: balance, endurance, locomotion, strength, transferring, bowel/bladder control, bathing, dressing, feeding, toileting and psychosocial support 5. Can the patient actively participate in an intensive therapy program of at least 3 hrs of therapy per day at least 5 days  per week? Yes 6. The potential for patient to make measurable gains while on inpatient rehab is excellent 7. Anticipated functional outcomes upon discharge from inpatients are min assist PT, min assist OT, and a SLP 8. Estimated rehab length of stay to reach the above functional goals is: 18 to 21 days 9. Does the patient have adequate social supports to accommodate these discharge functional goals? Yes 10. Anticipated D/C setting: Home 11. Anticipated post D/C treatments: HH therapy 12. Overall Rehab/Functional Prognosis: excellent  RECOMMENDATIONS: This patient's condition is appropriate for continued rehabilitative care in the following setting: CIR Patient has agreed to participate in recommended program. Yes Note that insurance prior authorization may be required for reimbursement for recommended care.  Comment:   Erick Colacendrew E Micaylah Bertucci 01/27/2018

## 2018-01-28 ENCOUNTER — Other Ambulatory Visit: Payer: Self-pay

## 2018-01-28 ENCOUNTER — Encounter (HOSPITAL_COMMUNITY): Payer: Self-pay

## 2018-01-28 ENCOUNTER — Inpatient Hospital Stay (HOSPITAL_COMMUNITY)
Admission: RE | Admit: 2018-01-28 | Discharge: 2018-02-19 | DRG: 057 | Disposition: A | Discharge status: Skilled Nursing Facility | Payer: Medicare Other | Source: Intra-hospital | Attending: Physical Medicine & Rehabilitation | Admitting: Physical Medicine & Rehabilitation

## 2018-01-28 DIAGNOSIS — R29810 Facial weakness: Secondary | ICD-10-CM | POA: Diagnosis present

## 2018-01-28 DIAGNOSIS — D62 Acute posthemorrhagic anemia: Secondary | ICD-10-CM

## 2018-01-28 DIAGNOSIS — K5900 Constipation, unspecified: Secondary | ICD-10-CM | POA: Diagnosis present

## 2018-01-28 DIAGNOSIS — E785 Hyperlipidemia, unspecified: Secondary | ICD-10-CM | POA: Diagnosis present

## 2018-01-28 DIAGNOSIS — R0989 Other specified symptoms and signs involving the circulatory and respiratory systems: Secondary | ICD-10-CM | POA: Diagnosis not present

## 2018-01-28 DIAGNOSIS — Z79899 Other long term (current) drug therapy: Secondary | ICD-10-CM | POA: Diagnosis not present

## 2018-01-28 DIAGNOSIS — I739 Peripheral vascular disease, unspecified: Secondary | ICD-10-CM | POA: Diagnosis present

## 2018-01-28 DIAGNOSIS — I69354 Hemiplegia and hemiparesis following cerebral infarction affecting left non-dominant side: Secondary | ICD-10-CM

## 2018-01-28 DIAGNOSIS — I639 Cerebral infarction, unspecified: Secondary | ICD-10-CM | POA: Diagnosis not present

## 2018-01-28 DIAGNOSIS — M1732 Unilateral post-traumatic osteoarthritis, left knee: Secondary | ICD-10-CM | POA: Diagnosis not present

## 2018-01-28 DIAGNOSIS — F419 Anxiety disorder, unspecified: Secondary | ICD-10-CM | POA: Diagnosis present

## 2018-01-28 DIAGNOSIS — G8929 Other chronic pain: Secondary | ICD-10-CM | POA: Diagnosis present

## 2018-01-28 DIAGNOSIS — R350 Frequency of micturition: Secondary | ICD-10-CM | POA: Diagnosis not present

## 2018-01-28 DIAGNOSIS — Z87891 Personal history of nicotine dependence: Secondary | ICD-10-CM

## 2018-01-28 DIAGNOSIS — D649 Anemia, unspecified: Secondary | ICD-10-CM | POA: Diagnosis present

## 2018-01-28 DIAGNOSIS — N179 Acute kidney failure, unspecified: Secondary | ICD-10-CM

## 2018-01-28 DIAGNOSIS — I10 Essential (primary) hypertension: Secondary | ICD-10-CM | POA: Diagnosis present

## 2018-01-28 DIAGNOSIS — Z823 Family history of stroke: Secondary | ICD-10-CM

## 2018-01-28 DIAGNOSIS — R3915 Urgency of urination: Secondary | ICD-10-CM | POA: Diagnosis not present

## 2018-01-28 DIAGNOSIS — E669 Obesity, unspecified: Secondary | ICD-10-CM | POA: Diagnosis present

## 2018-01-28 DIAGNOSIS — M79605 Pain in left leg: Secondary | ICD-10-CM | POA: Diagnosis present

## 2018-01-28 DIAGNOSIS — N182 Chronic kidney disease, stage 2 (mild): Secondary | ICD-10-CM

## 2018-01-28 DIAGNOSIS — R319 Hematuria, unspecified: Secondary | ICD-10-CM | POA: Diagnosis present

## 2018-01-28 MED ORDER — AMLODIPINE BESYLATE 10 MG PO TABS: 10.0000 mg | ORAL_TABLET | Freq: Every day | ORAL | Status: AC

## 2018-01-28 MED ORDER — CLOPIDOGREL BISULFATE 75 MG PO TABS
75.0000 mg | ORAL_TABLET | Freq: Every day | ORAL | Status: DC
  Administered 2018-01-29 – 2018-02-19 (×22): 75 mg via ORAL
  Filled 2018-01-28 (×22): qty 1

## 2018-01-28 MED ORDER — ENOXAPARIN SODIUM 40 MG/0.4ML ~~LOC~~ SOLN: 40.0000 mg | SUBCUTANEOUS | Status: DC

## 2018-01-28 MED ORDER — ASPIRIN 81 MG PO TBEC: 81.0000 mg | DELAYED_RELEASE_TABLET | Freq: Every day | ORAL | Status: AC

## 2018-01-28 MED ORDER — SORBITOL 70 % SOLN
30.0000 mL | Freq: Every day | Status: DC | PRN
  Administered 2018-01-29 – 2018-02-12 (×2): 30 mL via ORAL
  Filled 2018-01-28 (×5): qty 30

## 2018-01-28 MED ORDER — ATORVASTATIN CALCIUM 40 MG PO TABS: 40.0000 mg | ORAL_TABLET | Freq: Every day | ORAL | Status: AC

## 2018-01-28 MED ORDER — SODIUM CHLORIDE 0.9 % IV SOLN: 50.0000 mL | INTRAVENOUS | 0 refills | Status: DC

## 2018-01-28 MED ORDER — ACETAMINOPHEN 325 MG PO TABS
650.0000 mg | ORAL_TABLET | ORAL | Status: DC | PRN
  Administered 2018-01-29 – 2018-02-18 (×13): 650 mg via ORAL
  Filled 2018-01-28 (×13): qty 2

## 2018-01-28 MED ORDER — ATORVASTATIN CALCIUM 40 MG PO TABS
40.0000 mg | ORAL_TABLET | Freq: Every day | ORAL | Status: DC
  Administered 2018-01-28 – 2018-02-18 (×22): 40 mg via ORAL
  Filled 2018-01-28 (×22): qty 1

## 2018-01-28 MED ORDER — LORAZEPAM 0.5 MG PO TABS
0.5000 mg | ORAL_TABLET | Freq: Three times a day (TID) | ORAL | Status: DC | PRN
  Administered 2018-01-28 – 2018-01-30 (×4): 0.5 mg via ORAL
  Filled 2018-01-28 (×4): qty 1

## 2018-01-28 MED ORDER — CLOPIDOGREL BISULFATE 75 MG PO TABS: 75.0000 mg | ORAL_TABLET | Freq: Every day | ORAL | Status: AC

## 2018-01-28 MED ORDER — SENNOSIDES-DOCUSATE SODIUM 8.6-50 MG PO TABS: 1.0000 | ORAL_TABLET | Freq: Every evening | ORAL | Status: DC | PRN

## 2018-01-28 MED ORDER — ENOXAPARIN SODIUM 40 MG/0.4ML ~~LOC~~ SOLN
40.0000 mg | SUBCUTANEOUS | Status: DC
  Administered 2018-01-29 – 2018-02-19 (×22): 40 mg via SUBCUTANEOUS
  Filled 2018-01-28 (×22): qty 0.4

## 2018-01-28 MED ORDER — PANTOPRAZOLE SODIUM 40 MG PO TBEC
40.0000 mg | DELAYED_RELEASE_TABLET | Freq: Every day | ORAL | Status: DC
  Administered 2018-01-29 – 2018-02-19 (×22): 40 mg via ORAL
  Filled 2018-01-28 (×22): qty 1

## 2018-01-28 MED ORDER — AMLODIPINE BESYLATE 10 MG PO TABS
10.0000 mg | ORAL_TABLET | Freq: Every day | ORAL | Status: DC
  Administered 2018-01-29 – 2018-02-19 (×22): 10 mg via ORAL
  Filled 2018-01-28 (×22): qty 1

## 2018-01-28 MED ORDER — ASPIRIN EC 81 MG PO TBEC
81.0000 mg | DELAYED_RELEASE_TABLET | Freq: Every day | ORAL | Status: DC
  Administered 2018-01-29 – 2018-02-19 (×22): 81 mg via ORAL
  Filled 2018-01-28 (×22): qty 1

## 2018-01-28 MED ORDER — TRAMADOL HCL 50 MG PO TABS: 50.0000 mg | ORAL_TABLET | Freq: Four times a day (QID) | ORAL | Status: DC | PRN

## 2018-01-28 MED ORDER — TRAMADOL HCL 50 MG PO TABS
50.0000 mg | ORAL_TABLET | Freq: Four times a day (QID) | ORAL | Status: DC | PRN
  Administered 2018-01-29 – 2018-01-30 (×5): 50 mg via ORAL
  Filled 2018-01-28 (×5): qty 1

## 2018-01-28 NOTE — Care Management Important Message (Signed)
Important Message  Patient Details  Name: Wayne HaberGary Griffin MRN: [id number] Date of Birth: 1945/06/05   Medicare Important Message Given:  No  Due to illness patient did not sign.  Unsigned copy left   Nicholle Falzon 01/28/2018, 4:20 PM

## 2018-01-28 NOTE — Progress Notes (Signed)
Wayne Colace, MD  Physician  Physical Medicine and Rehabilitation  Consult Note  Signed  Date of Service:  01/27/2018 11:31 AM       Related encounter: ED to Hosp-Admission (Discharged) from 01/24/2018 in [facility]      Signed      Expand All Collapse All    Show:Clear all [x] Manual[x] Template[] Copied  Added by: [x] Kirsteins, Victorino Sparrow, MD  [] Hover for details  Physical Medicine and Rehabilitation Consult Reason for Consult: Stroke rehabilitation Referring Phsyician: Wayne Griffin is an 72 y.o. male.   HPI: Relatively healthy 72 year old male who developed left-sided arm and leg weakness causing a fall on 01/24/2018 was brought to the ED with left facial droop and left hemiparesis.  TPA was administered.  CTA showed no large vessel occlusion.  Initial NIH stroke score of 8 placed on Cleviprex for blood pressure control.  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  Small vessel disease is the suspected etiology.  Dual antiplatelet therapy for 3 weeks then aspirin alone has been recommended.  Speech therapy evaluation found that the patient was tolerating thin liquids without signs of aspiration.  D3 thin liquids were recommended.  OT evaluation on 01/25/2018 demonstrated decline in function now requiring max assist for bed mobility and min assist to maintain static sitting mod assist for upper body ADLs and max to total for lower body ADLs.  Was independent prior to admission except used a cane.  Physical therapy evaluation on 01/25/2018 demonstrated max assist lateral transfers, gait was not tested.  X-ray of the left hip and left knee did not show any signs of fracture.  There were degenerative changes of the left knee noted with small suprapatellar joint effusion PT evaluation on 01/26/2018 still demonstrated mod/max assist +2 for transfers, ambulation was not attempted Review of Systems  Constitutional: Negative for  chills, fever and weight loss.  HENT: Negative for congestion and hearing loss.   Eyes: Negative for blurred vision, double vision and redness.  Respiratory: Negative for cough, hemoptysis, sputum production, shortness of breath and wheezing.   Cardiovascular: Negative for chest pain and palpitations.  Gastrointestinal: Positive for constipation. Negative for abdominal pain, heartburn, nausea and vomiting.  Genitourinary: Negative for dysuria.       Urinary incontinence using condom catheter  Musculoskeletal: Positive for falls and joint pain. Negative for myalgias.  Skin: Negative for itching and rash.  Neurological: Positive for focal weakness. Negative for dizziness, tingling, sensory change and speech change.  Endo/Heme/Allergies: Does not bruise/bleed easily.  Psychiatric/Behavioral: Negative for depression. The patient is not nervous/anxious.     History reviewed. No pertinent past medical history. History reviewed. No pertinent surgical history.      Family History  Problem Relation Age of Onset  . Hypertension Mother   . Stroke Mother   . Hypertension Father    Social History:  reports that he has quit smoking. He has never used smokeless tobacco. He reports that he drank alcohol. He reports that he does not use drugs. Allergies: No Known Allergies No medications prior to admission.    Home: Home Living Family/patient expects to be discharged to:: Private residence Living Arrangements: Children(grandchildren) Available Help at Discharge: Family Type of Home: House Home Access: Stairs to enter Secretary/administrator of Steps: 2-3 Entrance Stairs-Rails: None Home Layout: One level Bathroom Shower/Tub: Health visitor: Standard Home Equipment: Medical laboratory scientific officer - single point  Lives With: Son  Functional History: Prior Function Vocation: Retired  Comments: overall independent though [facility] Functional Status:   Mobility:  ADL:  Cognition: Cognition Overall Cognitive Status: Impaired/Different from baseline Arousal/Alertness: Awake/alert Orientation Level: Oriented X4 Attention: Divided Divided Attention: Appears intact Memory: Appears intact Awareness: Appears intact Problem Solving: Appears intact Executive Function: Reasoning, Self Monitoring, Self Correcting Reasoning: Appears intact Self Monitoring: Appears intact Self Correcting: Appears intact Safety/Judgment: Appears intact Cognition Arousal/Alertness: Awake/alert Behavior During Therapy: WFL for tasks assessed/performed Overall Cognitive Status: Impaired/Different from baseline Area of Impairment: Following commands, Attention Current Attention Level: Selective Following Commands: Follows one step commands with increased time, Follows one step commands consistently General Comments: pt with difficulty focusing on task at hand during session when provided with environmental distractions   Blood pressure (!) 158/75, pulse 71, temperature 98.2 F (36.8 C), resp. rate 18, weight 110 kg, SpO2 98 %. Physical Exam  Nursing note and vitals reviewed. Constitutional: He is oriented to person, place, and time.  HENT:  Head: Normocephalic and atraumatic.  Eyes: Pupils are equal, round, and reactive to light. Conjunctivae and EOM are normal.  Neck: Normal range of motion.  Cardiovascular: Normal rate, regular rhythm and normal heart sounds.  No murmur heard. Respiratory: Effort normal and breath sounds normal. No respiratory distress.  GI: Soft. Bowel sounds are normal. He exhibits no distension.  Musculoskeletal: He exhibits edema. He exhibits no tenderness or deformity.  Neurological: He is alert and oriented to person, place, and time.  Motor strength is 5/5 in the right deltoid, bicep, tricep, grip, hip flexor, knee extensor, ankle dorsiflexor 0/5 in the left deltoid bicep tricep grip to minus at the left hip knee extensor  synergy otherwise 0/5 in left lower limb Sensation intact bilateral upper limb and lower limb to light touch Speech without dysarthria  Skin: Skin is warm and dry.  Psychiatric: He has a normal mood and affect.          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 Does the need for close, 24 hr/day medical supervision in concert with the patient's rehab needs make it unreasonable for this patient to be served in a less intensive setting? Yes 2. Co-Morbidities requiring supervision/potential complications: Anemia, CKD 2 3. Due to bladder management, bowel management, safety, skin/wound care, disease management, medication administration, pain management and patient education, does the patient require 24 hr/day rehab nursing? Yes 4. Does the patient require coordinated care of a physician, rehab nurse, PT (1-2 hrs/day, 5 days/week) and OT (1-2 hrs/day, 5 days/week) to address physical and functional deficits in the context of the above medical diagnosis(es)? Yes Addressing deficits in the following areas: balance, endurance, locomotion, strength, transferring, bowel/bladder control, bathing, dressing, feeding, toileting and psychosocial support 5. Can the patient actively participate in an intensive therapy program of at least 3 hrs of therapy per day at least 5 days per week? Yes 6. The potential for patient to make measurable gains while on inpatient rehab is excellent 7. Anticipated functional outcomes upon discharge from inpatients are min assist PT, min assist OT, and a SLP 8. Estimated rehab length of stay to reach the above functional goals is: 18 to 21 days 9. Does the patient have adequate social supports to accommodate these discharge functional goals? Yes 10. Anticipated D/C setting: Home 11. Anticipated post D/C treatments: HH therapy 12. Overall Rehab/Functional Prognosis: excellent  RECOMMENDATIONS: This  patient's condition is appropriate for continued rehabilitative care in the following setting: CIR Patient has agreed  to participate in recommended program. Yes Note that insurance prior authorization may be required for reimbursement for recommended care.  Comment:   Wayne Griffin 01/27/2018         Routing History

## 2018-01-28 NOTE — Discharge Summary (Addendum)
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 No pertinent past medical history. History reviewed. No pertinent surgical history.  Medications to be continued on Rehab Allergies as of 01/28/2018   No Known Allergies     Medication List    TAKE these medications   amLODipine 10 MG tablet Commonly known as:  NORVASC Take 1 tablet (10 mg total) by mouth daily. Start taking on:  01/29/2018   aspirin 81 MG EC tablet Take 1 tablet (81 mg total) by mouth daily. Start taking on:  01/29/2018   atorvastatin 40 MG tablet Commonly known as:  LIPITOR Take 1 tablet (40 mg total) by mouth daily at 6 PM.   clopidogrel 75 MG tablet Commonly known as:  PLAVIX Take 1 tablet (75 mg total) by mouth daily. Start taking on:  01/29/2018   enoxaparin 40 MG/0.4ML injection Commonly known as:  LOVENOX Inject 0.4 mLs (40 mg total) into the skin daily. Start taking on:  01/29/2018   sodium chloride 0.9 % infusion Inject 50 mLs into the vein continuous.   traMADol 50 MG tablet Commonly known as:  ULTRAM Take 1 tablet (50 mg total) by mouth every 6 (six) hours as needed for moderate pain.       LABORATORY STUDIES CBC    Component Value Date/Time   WBC 6.5 01/27/2018 0531   RBC 4.06 (L) 01/27/2018 0531   HGB 11.1 (L) 01/27/2018 0531   HCT 34.5 (L) 01/27/2018 0531   PLT 101 (L) 01/27/2018 0531   MCV 85.0 01/27/2018 0531   MCH  27.3 01/27/2018 0531   MCHC 32.2 01/27/2018 0531   RDW 14.6 01/27/2018 0531   LYMPHSABS 0.8 01/24/2018 0836   MONOABS 1.5 (H) 01/24/2018 0836   EOSABS 0.0 01/24/2018 0836   BASOSABS 0.0 01/24/2018 0836   CMP    Component Value Date/Time   NA 138 01/27/2018 0531   K 3.8 01/27/2018 0531   CL 106 01/27/2018 0531   CO2 24 01/27/2018 0531   GLUCOSE 98 01/27/2018 0531   BUN 16 01/27/2018 0531   CREATININE 1.30 (H) 01/27/2018 0531   CALCIUM 8.1 (L) 01/27/2018 0531   PROT 7.1 01/24/2018 0836   ALBUMIN 3.8 01/24/2018 0836   AST 25 01/24/2018 0836   ALT 17 01/24/2018 0836   ALKPHOS 50 01/24/2018 0836   BILITOT 1.0 01/24/2018 0836   GFRNONAA 55 (L) 01/27/2018 0531   GFRAA >60 01/27/2018 0531   COAGS Lab Results  Component Value Date   INR 1.04 01/24/2018   INR 1.0 01/07/2008   INR 0.9 01/06/2008   Lipid Panel    Component Value Date/Time   CHOL 172 01/25/2018 0402   TRIG 107 01/25/2018 0402   HDL 36 (L) 01/25/2018 0402   CHOLHDL 4.8 01/25/2018 0402   VLDL 21 01/25/2018 0402   LDLCALC 115 (H) 01/25/2018 0402   HgbA1C  Lab Results  Component Value  Date   HGBA1C 5.3 01/25/2018   Urinalysis    Component Value Date/Time   COLORURINE YELLOW 01/06/2008 0151   APPEARANCEUR CLEAR 01/06/2008 0151   LABSPEC 1.011 01/06/2008 0151   PHURINE 5.0 01/06/2008 0151   GLUCOSEU NEGATIVE 01/06/2008 0151   HGBUR NEGATIVE 01/06/2008 0151   BILIRUBINUR NEGATIVE 01/06/2008 0151   KETONESUR NEGATIVE 01/06/2008 0151   PROTEINUR NEGATIVE 01/06/2008 0151   UROBILINOGEN 0.2 01/06/2008 0151   NITRITE NEGATIVE 01/06/2008 0151   LEUKOCYTESUR  01/06/2008 0151    NEGATIVE MICROSCOPIC NOT DONE ON URINES WITH NEGATIVE PROTEIN, BLOOD, LEUKOCYTES, NITRITE, OR GLUCOSE <1000 mg/dL.   Urine Drug Screen No results found for: LABOPIA, COCAINSCRNUR, LABBENZ, AMPHETMU, THCU, LABBARB  Alcohol Level No results found for: [facility]   SIGNIFICANT DIAGNOSTIC STUDIES Ct Angio Head W Or Wo Contrast  Result  Date: 01/24/2018 CLINICAL DATA:  Left-sided weakness.  Code stroke. EXAM: CT ANGIOGRAPHY HEAD AND NECK CT PERFUSION BRAIN TECHNIQUE: Multidetector CT imaging of the head and neck was performed using the standard protocol during bolus administration of intravenous contrast. Multiplanar CT image reconstructions and MIPs were obtained to evaluate the vascular anatomy. Carotid stenosis measurements (when applicable) are obtained utilizing NASCET criteria, using the distal internal carotid diameter as the denominator. Multiphase CT imaging of the brain was performed following IV bolus contrast injection. Subsequent parametric perfusion maps were calculated using RAPID software. CONTRAST:  ISOVUE-370 IOPAMIDOL (ISOVUE-370) INJECTION 76% COMPARISON:  Head CT earlier same day. FINDINGS: CTA NECK FINDINGS Aortic arch: Aortic atherosclerosis. No aneurysm or dissection. Left vertebral artery originates from the arch. Right carotid system: Common carotid artery is widely patent to the bifurcation region. Mild soft and calcified plaque at the carotid bifurcation and ICA bulb but no stenosis. Cervical ICA widely patent. The patient has the unusual variation of a small branch vessel from the cervical ICA that appears to serve external carotid territory. Left carotid system: Common carotid artery widely patent to the bifurcation region. Soft and calcified plaque at the ICA bifurcation. Minimal diameter at the distal bulb is 4 mm. Compared to a more distal cervical ICA diameter of 5 mm, this indicates only a 20% stenosis. Vertebral arteries: Right vertebral artery is dominant. Right vertebral artery origin is widely patent. The vessel is widely patent through the cervical region to the foramen magnum. Left vertebral artery arises from the arch. This is a small vessel that appears normally patent through the cervical region to the foramen magnum. Skeleton: Ordinary cervical spondylosis. Other neck: No mass or lymphadenopathy.  Upper chest: Mild apical scarring.  No active process. Review of the MIP images confirms the above findings CTA HEAD FINDINGS Anterior circulation: Both internal carotid arteries are patent through the skull base and siphon regions. There is atherosclerotic change within the siphon regions but no stenosis greater than 30% suspected. The anterior and middle cerebral vessels are patent. No large or medium vessel occlusion. There is 50% stenosis of the proximal M2 segment on the right immediately past the origin the temporal branch. I do not see any occluded or missing branches. Posterior circulation: Both vertebral arteries are patent through the foramen magnum to the basilar. The left is diminutive. No basilar stenosis. Posterior circulation branch vessels are patent. Right PCA has fetal origin from the anterior circulation. Venous sinuses: Patent and normal. Anatomic variants: None other significant. Delayed phase: No abnormal enhancement. Review of the MIP images confirms the above findings CT Brain Perfusion Findings: CBF (<30%) Volume: 0mL Perfusion (Tmax>6.0s) volume: 0mL Mismatch  Volume: 0mL Infarction Location:None IMPRESSION: Negative perfusion study. Atherosclerosis of the aortic arch. Atherosclerosis at both carotid bifurcations and ICA bulb regions. No stenosis on the right. 20% stenosis in the left ICA bulb. Intracranial atherosclerotic irregularity. 50% stenosis of the proximal M2 segment on the right immediately past the anterior temporal branch. The study does not show any occluded large or medium vessels. These results were communicated to Dr. Wilford Corner At 9:14 amon 12/5/2019by text page via the [facility] messaging system. Electronically Signed   By: Paulina Fusi M.D.   On: 01/24/2018 09:15   Ct Angio Neck W And/or Wo Contrast  Result Date: 01/24/2018 CLINICAL DATA:  Left-sided weakness.  Code stroke. EXAM: CT ANGIOGRAPHY HEAD AND NECK CT PERFUSION BRAIN TECHNIQUE: Multidetector CT imaging of the head and  neck was performed using the standard protocol during bolus administration of intravenous contrast. Multiplanar CT image reconstructions and MIPs were obtained to evaluate the vascular anatomy. Carotid stenosis measurements (when applicable) are obtained utilizing NASCET criteria, using the distal internal carotid diameter as the denominator. Multiphase CT imaging of the brain was performed following IV bolus contrast injection. Subsequent parametric perfusion maps were calculated using RAPID software. CONTRAST:  ISOVUE-370 IOPAMIDOL (ISOVUE-370) INJECTION 76% COMPARISON:  Head CT earlier same day. FINDINGS: CTA NECK FINDINGS Aortic arch: Aortic atherosclerosis. No aneurysm or dissection. Left vertebral artery originates from the arch. Right carotid system: Common carotid artery is widely patent to the bifurcation region. Mild soft and calcified plaque at the carotid bifurcation and ICA bulb but no stenosis. Cervical ICA widely patent. The patient has the unusual variation of a small branch vessel from the cervical ICA that appears to serve external carotid territory. Left carotid system: Common carotid artery widely patent to the bifurcation region. Soft and calcified plaque at the ICA bifurcation. Minimal diameter at the distal bulb is 4 mm. Compared to a more distal cervical ICA diameter of 5 mm, this indicates only a 20% stenosis. Vertebral arteries: Right vertebral artery is dominant. Right vertebral artery origin is widely patent. The vessel is widely patent through the cervical region to the foramen magnum. Left vertebral artery arises from the arch. This is a small vessel that appears normally patent through the cervical region to the foramen magnum. Skeleton: Ordinary cervical spondylosis. Other neck: No mass or lymphadenopathy. Upper chest: Mild apical scarring.  No active process. Review of the MIP images confirms the above findings CTA HEAD FINDINGS Anterior circulation: Both internal carotid  arteries are patent through the skull base and siphon regions. There is atherosclerotic change within the siphon regions but no stenosis greater than 30% suspected. The anterior and middle cerebral vessels are patent. No large or medium vessel occlusion. There is 50% stenosis of the proximal M2 segment on the right immediately past the origin the temporal branch. I do not see any occluded or missing branches. Posterior circulation: Both vertebral arteries are patent through the foramen magnum to the basilar. The left is diminutive. No basilar stenosis. Posterior circulation branch vessels are patent. Right PCA has fetal origin from the anterior circulation. Venous sinuses: Patent and normal. Anatomic variants: None other significant. Delayed phase: No abnormal enhancement. Review of the MIP images confirms the above findings CT Brain Perfusion Findings: CBF (<30%) Volume: 0mL Perfusion (Tmax>6.0s) volume: 0mL Mismatch Volume: 0mL Infarction Location:None IMPRESSION: Negative perfusion study. Atherosclerosis of the aortic arch. Atherosclerosis at both carotid bifurcations and ICA bulb regions. No stenosis on the right. 20% stenosis in the left ICA bulb. Intracranial atherosclerotic  irregularity. 50% stenosis of the proximal M2 segment on the right immediately past the anterior temporal branch. The study does not show any occluded large or medium vessels. These results were communicated to Dr. Wilford Corner At 9:14 amon 12/5/2019by text page via the [facility] messaging system. Electronically Signed   By: Paulina Fusi M.D.   On: 01/24/2018 09:15   Mr Brain Wo Contrast  Result Date: 01/25/2018 CLINICAL DATA:  Intermittent left-sided weakness.  Fall. EXAM: MRI HEAD WITHOUT CONTRAST TECHNIQUE: Multiplanar, multiecho pulse sequences of the brain and surrounding structures were obtained without intravenous contrast. COMPARISON:  CTA head and neck and CT perfusion 01/24/2018 FINDINGS: Brain: The diffusion-weighted images demonstrate  acute/subacute infarct involving the posterior aspect of the right lentiform nucleus, of the posterior limb right internal capsule, and posterior body of the right caudate. Maximal dimension of the infarct territory is 2.1 cm. No other acute infarct is present. An area of remote hemorrhage is present in the right lentiform nucleus, just anterior to the acute infarct. T2 signal changes are associated with the acute/subacute infarct. Multiple other remote nonhemorrhagic lacunar infarcts are present in the basal ganglia bilaterally. Remote lacunar infarcts extend into the corona radiata. Remote lacunar infarcts are also present the thalami bilaterally. Remote lacunar infarcts are present within the cerebellum bilaterally. Brainstem is unremarkable. Internal auditory canals are within normal limits bilaterally. Ventricles are proportionate to the degree of atrophy. No significant extra-axial fluid collection is present. Vascular: Flow is present in the major intracranial arteries. Skull and upper cervical spine: Skull base is within normal limits. Degenerative disc disease present at C2-3. Craniocervical junction is otherwise normal. Sinuses/Orbits: Mild mucosal thickening is present in the left maxillary sinus, bilateral sphenoid sinuses and ethmoid air cells. Mucosal thickening extends into the left frontal sinus. No fluid levels are present. Mastoid air cells are clear. Globes and orbits are within normal limits bilaterally. IMPRESSION: 1. Acute/subacute nonhemorrhagic infarct involving the posterior right lentiform nucleus, posterior limb right internal capsule, and dorsal caudate. 2. Remote hemorrhagic infarct of the right lentiform nucleus just anterior to the area of acute/subacute infarction. 3. Multiple other remote lacunar infarcts are present within the basal ganglia bilaterally, the thalami bilaterally, and the cerebellum bilaterally. Electronically Signed   By: Marin Roberts M.D.   On: 01/25/2018  07:46   Ct Cerebral Perfusion W Contrast  Result Date: 01/24/2018 CLINICAL DATA:  Left-sided weakness.  Code stroke. EXAM: CT ANGIOGRAPHY HEAD AND NECK CT PERFUSION BRAIN TECHNIQUE: Multidetector CT imaging of the head and neck was performed using the standard protocol during bolus administration of intravenous contrast. Multiplanar CT image reconstructions and MIPs were obtained to evaluate the vascular anatomy. Carotid stenosis measurements (when applicable) are obtained utilizing NASCET criteria, using the distal internal carotid diameter as the denominator. Multiphase CT imaging of the brain was performed following IV bolus contrast injection. Subsequent parametric perfusion maps were calculated using RAPID software. CONTRAST:  ISOVUE-370 IOPAMIDOL (ISOVUE-370) INJECTION 76% COMPARISON:  Head CT earlier same day. FINDINGS: CTA NECK FINDINGS Aortic arch: Aortic atherosclerosis. No aneurysm or dissection. Left vertebral artery originates from the arch. Right carotid system: Common carotid artery is widely patent to the bifurcation region. Mild soft and calcified plaque at the carotid bifurcation and ICA bulb but no stenosis. Cervical ICA widely patent. The patient has the unusual variation of a small branch vessel from the cervical ICA that appears to serve external carotid territory. Left carotid system: Common carotid artery widely patent to the bifurcation region. Soft and calcified  plaque at the ICA bifurcation. Minimal diameter at the distal bulb is 4 mm. Compared to a more distal cervical ICA diameter of 5 mm, this indicates only a 20% stenosis. Vertebral arteries: Right vertebral artery is dominant. Right vertebral artery origin is widely patent. The vessel is widely patent through the cervical region to the foramen magnum. Left vertebral artery arises from the arch. This is a small vessel that appears normally patent through the cervical region to the foramen magnum. Skeleton: Ordinary cervical  spondylosis. Other neck: No mass or lymphadenopathy. Upper chest: Mild apical scarring.  No active process. Review of the MIP images confirms the above findings CTA HEAD FINDINGS Anterior circulation: Both internal carotid arteries are patent through the skull base and siphon regions. There is atherosclerotic change within the siphon regions but no stenosis greater than 30% suspected. The anterior and middle cerebral vessels are patent. No large or medium vessel occlusion. There is 50% stenosis of the proximal M2 segment on the right immediately past the origin the temporal branch. I do not see any occluded or missing branches. Posterior circulation: Both vertebral arteries are patent through the foramen magnum to the basilar. The left is diminutive. No basilar stenosis. Posterior circulation branch vessels are patent. Right PCA has fetal origin from the anterior circulation. Venous sinuses: Patent and normal. Anatomic variants: None other significant. Delayed phase: No abnormal enhancement. Review of the MIP images confirms the above findings CT Brain Perfusion Findings: CBF (<30%) Volume: 0mL Perfusion (Tmax>6.0s) volume: 0mL Mismatch Volume: 0mL Infarction Location:None IMPRESSION: Negative perfusion study. Atherosclerosis of the aortic arch. Atherosclerosis at both carotid bifurcations and ICA bulb regions. No stenosis on the right. 20% stenosis in the left ICA bulb. Intracranial atherosclerotic irregularity. 50% stenosis of the proximal M2 segment on the right immediately past the anterior temporal branch. The study does not show any occluded large or medium vessels. These results were communicated to Dr. Wilford Corner At 9:14 amon 12/5/2019by text page via the [facility] messaging system. Electronically Signed   By: Paulina Fusi M.D.   On: 01/24/2018 09:15   Dg Chest Port 1 View  Result Date: 01/25/2018 CLINICAL DATA:  Respiratory difficulty EXAM: PORTABLE CHEST 1 VIEW COMPARISON:  02/19/2011 FINDINGS: Cardiac shadow  is mildly enlarged but accentuated by the portable technique. The lungs are well aerated bilaterally without focal infiltrate or sizable effusion. No acute bony abnormality is seen. IMPRESSION: No active disease. Electronically Signed   By: Alcide Clever M.D.   On: 01/25/2018 07:31   Dg Knee Left Port  Result Date: 01/25/2018 CLINICAL DATA:  Post multiple falls with left knee pain and swelling. EXAM: PORTABLE LEFT KNEE - 1-2 VIEW COMPARISON:  None. FINDINGS: No definite fracture or dislocation. Moderate in size high density suprapatellar joint effusion. Overlying soft tissue swelling anterior to the patella. Chronic appearing arthritic changes in all 3 compartments with joint space narrowing, subchondral sclerosis, chondrocalcinosis and small osteophyte formation. IMPRESSION: No fractures seen radiographically. High density suprapatellar joint effusion which is suggestive of radio occult fracture or internal derangement of the knee. 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 Electronically Signed   By: Ted Mcalpine M.D.   On: 01/25/2018 11:32   Dg Hip Unilat With Pelvis 2-3 Views Left  Result Date: 01/25/2018 CLINICAL DATA:  Multiple recent falls due to her recent stroke. Generalize left lower extremity pain. No visible bruising. EXAM: DG HIP (WITH OR WITHOUT PELVIS) 2-3V LEFT COMPARISON:  Coronal and sagittal reconstructed  images through the pelvis and hips from an abdominal CT scan of January 06, 2008 FINDINGS: The observed portions of the bony pelvis are subjectively adequately mineralized. There is no lytic or blastic lesion. There is contrast within the urinary bladder. There multiple pelvic phleboliths. AP and lateral views of the left hip reveal mild symmetric narrowing of the left hip joint space. Small spurs arise from the articular margins of the acetabulum. The femoral head, neck, intertrochanteric, and immediate subtrochanteric regions are  normal. IMPRESSION: There is no acute bony abnormality of the left hip or visualized portions of the pelvis. Mild symmetric narrowing of the left hip joint space. Electronically Signed   By: David  [place] M.D.   On: 01/25/2018 11:28   Ct Head Code Stroke Wo Contrast  Result Date: 01/24/2018 CLINICAL DATA:  Code stroke.  Left-sided weakness.  Code stroke. EXAM: CT HEAD WITHOUT CONTRAST TECHNIQUE: Contiguous axial images were obtained from the base of the skull through the vertex without intravenous contrast. COMPARISON:  None. FINDINGS: Brain: Age related atrophy. Mild chronic small-vessel ischemic change of the hemispheric white matter. No sign of acute parenchymal infarction. Old small vessel infarctions affect the thalami, basal ganglia and external capsule regions. No mass, hemorrhage, hydrocephalus or extra-axial collection. Vascular: There is atherosclerotic calcification of the major vessels at the base of the brain. 1 could question a small hyperdense focus in the right MCA bifurcation region. This is certainly not definite evidence acute embolic disease. Skull: Normal Sinuses/Orbits: Clear/normal Other: None ASPECTS ([place] Stroke Program Early CT Score) - Ganglionic level infarction (caudate, lentiform nuclei, internal capsule, insula, M1-M3 cortex): 7 - Supraganglionic infarction (M4-M6 cortex): 3 Total score (0-10 with 10 being normal): 10 IMPRESSION: 1. No acute parenchymal finding. Chronic small-vessel ischemic changes. 2. ASPECTS is 10. 3. One could question a focal hyperdensity at the right MCA bifurcation, but this is not definite. 4. This result was communicated by pager to the stroke service. Electronically Signed   By: Paulina Fusi M.D.   On: 01/24/2018 08:50    2D Echocardiogram  - Left ventricle: The cavity size was normal. Wall thickness was increased in a pattern of mild LVH. Systolic function was normal. The estimated ejection fraction was in the range of 55% to 60%. Doppler  parameters are consistent with abnormal left ventricular relaxation (grade 1 diastolic dysfunction). - Mitral valve: Calcified annulus. Mildly thickened leaflets . - Left atrium: The atrium was mildly dilated.   HISTORY OF PRESENT ILLNESS Wayne Griffin is a 72 y.o. male no pertinent history secondary to not seen PCP in a very long time.  Per son patient had a period of left-sided arm and leg weakness which caused the fall last night but he apparently resolved completely.  Patient went to bed and woke up at approximately 5 AM at this point got out of bed and fell once again but the symptoms did not resolve.  Patient was brought to the ED and left facial droop, left-sided hemiparesis was noted.  In addition CTA of head and neck was obtained and did not show any large vessel occlusion.  CT perfusion was also obtained. Patient was LKW at 0500 on 01/24/2018. Premorbid modified Rankin scale (mRS): 0. NIH stroke score 8. Decision to give TPA was made and patient agreed.     HOSPITAL COURSE Mr. Wayne Griffin is a 72 y.o. male with history of left knee surgery many years ago, not seeing doctor for years admitted for left sided weakness.TPA given.  Stroke:  right BG/CR infarct s/p IV tPA, infarct secondary to small vessel disease   Resultant left sided weakness and dysarthria  MRI  Right BG/CR infarct  CT no acute abnormality  CTA head and neck right M1 50% stenosis, no LVO  2D Echo  Normal ejection fraction of 55-60% with mild LVH.  LDL 115  HgbA1c 5.3  No antithrombotic prior to admission, now on aspirin 81 mg daily and clopidogrel 75 mg daily. DAPT for 3 weeks and then ASA alone  Therapy recommendations:  CIR  Disposition:  CIR  Hypertension  Stable   Treated with cleviprex post tPA  Added amlodipine 10  BP goal normotensive  Hyperlipidemia  Home meds:  none   LDL 115, goal < 70  Now on lipitor 40  Continue statin at discharge  Left knee / hip pain  Fall x 2 at  home  Left hip and knee X-ray neg for fracture  Left knee swelling with tenderness on palpation   Left knee surgery many years ago  Tramadol PRN  Do not feel septic arthritis   Other Stroke Risk Factors  Advanced age  Obesity, There is no height or weight on file to calculate BMI.   Other Active Problems  CKD stage 2, Elevated Cre 1.68->1.50->1.30  Leukocytosis, resolved WBC 15.7 -> 8.1 - CXR neg, afebrile    DISCHARGE EXAM Blood pressure (!) 155/76, pulse 73, temperature 98 F (36.7 C), temperature source Oral, resp. rate 18, weight 110 kg, SpO2 98 %. General - obese elderly Caucasian male   Cardiovascular - Regular rate and rhythm.  Mental Status -  Level of arousal and orientation to time, place, and person were intact. Language including expression, naming, repetition, comprehension was assessed and found intact, mild dysarthria  Cranial Nerves II - XII - II - Visual field intact OU. III, IV, VI - Extraocular movements intact. V - Facial sensation intact bilaterally. VII - mild left facial droop. VIII - Hearing & vestibular intact bilaterally. X - Palate elevates symmetrically, mild dysarthria. XI - Chin turning & shoulder shrug intact bilaterally. XII - Tongue protrusion intact.  Motor Strength - The patient's strength was normal in RUE and RLE but left UE 1/5 proximal and 0/5 distally, LLE 2/5 proximal and distal.  Bulk was normal and fasciculations were absent.   Motor Tone - Muscle tone was assessed at the neck and appendages and was mildly elevated at LUE and LLE. Reflexes - The patient's reflexes were symmetrical in all extremities and he had left babinski. Sensory - Light touch, temperature/pinprick were assessed and were symmetrical.   Coordination - The patient had normal movements in the right hand with no ataxia or dysmetria.  Tremor was present on the right hand, resting and action tremor   Gait and Station - deferred.  Discharge Diet   Dysphagia 3 thin liquids  DISCHARGE PLAN  Disposition:  [facility], OT and ST  aspirin 81 mg daily and clopidogrel 75 mg daily for secondary stroke prevention x 3 weeks then ASPIRIN alone  Recommend ongoing risk factor control by Primary Care Physician at time of discharge from inpatient rehabilitation.  Follow-up  PCP in 2 weeks following discharge from rehab.  Follow-up in [facility] in 4 weeks following discharge from rehab, office to schedule an appointment.   35 minutes were spent preparing discharge.  Annie MainSharon Biby, MSN, APRN, ANVP-BC, AGPCNP-BC Advanced Practice Stroke Nurse [facility] See  Amion for Schedule & Pager information 01/28/2018 1:16 PM  I have personally obtained history,examined this patient, reviewed notes, independently viewed imaging studies, participated in medical decision making and plan of care.ROS completed by me personally and pertinent positives fully documented  I have made any additions or clarifications directly to the above note. Agree with note above.    Delia Heady, MD Medical Director [facility] Pager: [phone] 01/28/2018 1:47 PM

## 2018-01-28 NOTE — Progress Notes (Addendum)
IP rehab admissions - I met briefly with patient.  I gave him rehab booklets.  He lives with his son and son works.  I have placed a call to his son and I will await response from son.  Call me for questions.  [phone]  I spoke with patient's son.  Son prefers CIR.  Son aware of the need for caregiver support after discharge from rehab.  Bed available and will admit to CIR today.  Patient has been medically cleared by Attending MD.  Call me for questions.  [phone]

## 2018-01-28 NOTE — Progress Notes (Deleted)
STROKE TEAM PROGRESS NOTE   SUBJECTIVE (INTERVAL HISTORY) No one is at the bedside.  Overall his condition is unchanged. He still has dysarthria and left hemiparesis.  X-rays of the hip) pelvis did not show any fracture. X-ray of the left knee showed degenerative changes from arthritis and suprapatellar joint effusion  OBJECTIVE Temp:  [97.7 F (36.5 C)-98.4 F (36.9 C)] 98 F (36.7 C) (12/09 0745) Pulse Rate:  [63-85] 63 (12/09 0745) Cardiac Rhythm: Sinus bradycardia (12/09 0702) Resp:  [16-20] 18 (12/09 0745) BP: (155-183)/(69-82) 174/82 (12/09 0745) SpO2:  [94 %-99 %] 99 % (12/09 0745)  Recent Labs  Lab 01/24/18 0831  GLUCAP 156*   Recent Labs  Lab 01/24/18 0836 01/24/18 0840 01/25/18 0402 01/26/18 0504 01/27/18 0531  NA 140 140 139 139 138  K 4.0 3.9 3.9 3.7 3.8  CL 105 104 107 106 106  CO2 20*  --  21* 22 24  GLUCOSE 159* 159* 108* 94 98  BUN 18 22 18 21 16   CREATININE 1.68* 1.50* 1.36* 1.69* 1.30*  CALCIUM 9.1  --  8.6* 8.3* 8.1*   Recent Labs  Lab 01/24/18 0836  AST 25  ALT 17  ALKPHOS 50  BILITOT 1.0  PROT 7.1  ALBUMIN 3.8   Recent Labs  Lab 01/24/18 0836 01/24/18 0840 01/25/18 0402 01/26/18 0504 01/27/18 0531  WBC 15.7*  --  9.4 7.3 6.5  NEUTROABS 13.3*  --   --   --   --   HGB 14.6 15.6 14.0 12.2* 11.1*  HCT 46.4 46.0 42.6 38.8* 34.5*  MCV 85.6  --  83.5 85.7 85.0  PLT PLATELET CLUMPS NOTED ON SMEAR, COUNT APPEARS DECREASED  --  104* 105* 101*   No results for input(s): CKTOTAL, CKMB, CKMBINDEX, TROPONINI in the last 168 hours. No results for input(s): LABPROT, INR in the last 72 hours. No results for input(s): COLORURINE, LABSPEC, PHURINE, GLUCOSEU, HGBUR, BILIRUBINUR, KETONESUR, PROTEINUR, UROBILINOGEN, NITRITE, LEUKOCYTESUR in the last 72 hours.  Invalid input(s): APPERANCEUR     Component Value Date/Time   CHOL 172 01/25/2018 0402   TRIG 107 01/25/2018 0402   HDL 36 (L) 01/25/2018 0402   CHOLHDL 4.8 01/25/2018 0402   VLDL 21  01/25/2018 0402   LDLCALC 115 (H) 01/25/2018 0402   Lab Results  Component Value Date   HGBA1C 5.3 01/25/2018   No results found for: LABOPIA, COCAINSCRNUR, LABBENZ, AMPHETMU, THCU, LABBARB  No results for input(s): ETH in the last 168 hours.  I have personally reviewed the radiological images below and agree with the radiology interpretations.  Ct Angio Head W Or Wo Contrast  Result Date: 01/24/2018 CLINICAL DATA:  Left-sided weakness.  Code stroke. EXAM: CT ANGIOGRAPHY HEAD AND NECK CT PERFUSION BRAIN TECHNIQUE: Multidetector CT imaging of the head and neck was performed using the standard protocol during bolus administration of intravenous contrast. Multiplanar CT image reconstructions and MIPs were obtained to evaluate the vascular anatomy. Carotid stenosis measurements (when applicable) are obtained utilizing NASCET criteria, using the distal internal carotid diameter as the denominator. Multiphase CT imaging of the brain was performed following IV bolus contrast injection. Subsequent parametric perfusion maps were calculated using RAPID software. CONTRAST:  ISOVUE-370 IOPAMIDOL (ISOVUE-370) INJECTION 76% COMPARISON:  Head CT earlier same day. FINDINGS: CTA NECK FINDINGS Aortic arch: Aortic atherosclerosis. No aneurysm or dissection. Left vertebral artery originates from the arch. Right carotid system: Common carotid artery is widely patent to the bifurcation region. Mild soft and calcified plaque at the carotid  bifurcation and ICA bulb but no stenosis. Cervical ICA widely patent. The patient has the unusual variation of a small branch vessel from the cervical ICA that appears to serve external carotid territory. Left carotid system: Common carotid artery widely patent to the bifurcation region. Soft and calcified plaque at the ICA bifurcation. Minimal diameter at the distal bulb is 4 mm. Compared to a more distal cervical ICA diameter of 5 mm, this indicates only a 20% stenosis. Vertebral  arteries: Right vertebral artery is dominant. Right vertebral artery origin is widely patent. The vessel is widely patent through the cervical region to the foramen magnum. Left vertebral artery arises from the arch. This is a small vessel that appears normally patent through the cervical region to the foramen magnum. Skeleton: Ordinary cervical spondylosis. Other neck: No mass or lymphadenopathy. Upper chest: Mild apical scarring.  No active process. Review of the MIP images confirms the above findings CTA HEAD FINDINGS Anterior circulation: Both internal carotid arteries are patent through the skull base and siphon regions. There is atherosclerotic change within the siphon regions but no stenosis greater than 30% suspected. The anterior and middle cerebral vessels are patent. No large or medium vessel occlusion. There is 50% stenosis of the proximal M2 segment on the right immediately past the origin the temporal branch. I do not see any occluded or missing branches. Posterior circulation: Both vertebral arteries are patent through the foramen magnum to the basilar. The left is diminutive. No basilar stenosis. Posterior circulation branch vessels are patent. Right PCA has fetal origin from the anterior circulation. Venous sinuses: Patent and normal. Anatomic variants: None other significant. Delayed phase: No abnormal enhancement. Review of the MIP images confirms the above findings CT Brain Perfusion Findings: CBF (<30%) Volume: 0mL Perfusion (Tmax>6.0s) volume: 0mL Mismatch Volume: 0mL Infarction Location:None IMPRESSION: Negative perfusion study. Atherosclerosis of the aortic arch. Atherosclerosis at both carotid bifurcations and ICA bulb regions. No stenosis on the right. 20% stenosis in the left ICA bulb. Intracranial atherosclerotic irregularity. 50% stenosis of the proximal M2 segment on the right immediately past the anterior temporal branch. The study does not show any occluded large or medium vessels.  These results were communicated to Dr. Wilford Corner At 9:14 amon 12/5/2019by text page via the [facility] messaging system. Electronically Signed   By: Paulina Fusi M.D.   On: 01/24/2018 09:15   Ct Angio Neck W And/or Wo Contrast  Result Date: 01/24/2018 CLINICAL DATA:  Left-sided weakness.  Code stroke. EXAM: CT ANGIOGRAPHY HEAD AND NECK CT PERFUSION BRAIN TECHNIQUE: Multidetector CT imaging of the head and neck was performed using the standard protocol during bolus administration of intravenous contrast. Multiplanar CT image reconstructions and MIPs were obtained to evaluate the vascular anatomy. Carotid stenosis measurements (when applicable) are obtained utilizing NASCET criteria, using the distal internal carotid diameter as the denominator. Multiphase CT imaging of the brain was performed following IV bolus contrast injection. Subsequent parametric perfusion maps were calculated using RAPID software. CONTRAST:  ISOVUE-370 IOPAMIDOL (ISOVUE-370) INJECTION 76% COMPARISON:  Head CT earlier same day. FINDINGS: CTA NECK FINDINGS Aortic arch: Aortic atherosclerosis. No aneurysm or dissection. Left vertebral artery originates from the arch. Right carotid system: Common carotid artery is widely patent to the bifurcation region. Mild soft and calcified plaque at the carotid bifurcation and ICA bulb but no stenosis. Cervical ICA widely patent. The patient has the unusual variation of a small branch vessel from the cervical ICA that appears to serve external carotid territory. Left carotid system:  Common carotid artery widely patent to the bifurcation region. Soft and calcified plaque at the ICA bifurcation. Minimal diameter at the distal bulb is 4 mm. Compared to a more distal cervical ICA diameter of 5 mm, this indicates only a 20% stenosis. Vertebral arteries: Right vertebral artery is dominant. Right vertebral artery origin is widely patent. The vessel is widely patent through the cervical region to the foramen magnum.  Left vertebral artery arises from the arch. This is a small vessel that appears normally patent through the cervical region to the foramen magnum. Skeleton: Ordinary cervical spondylosis. Other neck: No mass or lymphadenopathy. Upper chest: Mild apical scarring.  No active process. Review of the MIP images confirms the above findings CTA HEAD FINDINGS Anterior circulation: Both internal carotid arteries are patent through the skull base and siphon regions. There is atherosclerotic change within the siphon regions but no stenosis greater than 30% suspected. The anterior and middle cerebral vessels are patent. No large or medium vessel occlusion. There is 50% stenosis of the proximal M2 segment on the right immediately past the origin the temporal branch. I do not see any occluded or missing branches. Posterior circulation: Both vertebral arteries are patent through the foramen magnum to the basilar. The left is diminutive. No basilar stenosis. Posterior circulation branch vessels are patent. Right PCA has fetal origin from the anterior circulation. Venous sinuses: Patent and normal. Anatomic variants: None other significant. Delayed phase: No abnormal enhancement. Review of the MIP images confirms the above findings CT Brain Perfusion Findings: CBF (<30%) Volume: 0mL Perfusion (Tmax>6.0s) volume: 0mL Mismatch Volume: 0mL Infarction Location:None IMPRESSION: Negative perfusion study. Atherosclerosis of the aortic arch. Atherosclerosis at both carotid bifurcations and ICA bulb regions. No stenosis on the right. 20% stenosis in the left ICA bulb. Intracranial atherosclerotic irregularity. 50% stenosis of the proximal M2 segment on the right immediately past the anterior temporal branch. The study does not show any occluded large or medium vessels. These results were communicated to Dr. Wilford Corner At 9:14 amon 12/5/2019by text page via the [facility] messaging system. Electronically Signed   By: Paulina Fusi M.D.   On: 01/24/2018  09:15   Mr Brain Wo Contrast  Result Date: 01/25/2018 CLINICAL DATA:  Intermittent left-sided weakness.  Fall. EXAM: MRI HEAD WITHOUT CONTRAST TECHNIQUE: Multiplanar, multiecho pulse sequences of the brain and surrounding structures were obtained without intravenous contrast. COMPARISON:  CTA head and neck and CT perfusion 01/24/2018 FINDINGS: Brain: The diffusion-weighted images demonstrate acute/subacute infarct involving the posterior aspect of the right lentiform nucleus, of the posterior limb right internal capsule, and posterior body of the right caudate. Maximal dimension of the infarct territory is 2.1 cm. No other acute infarct is present. An area of remote hemorrhage is present in the right lentiform nucleus, just anterior to the acute infarct. T2 signal changes are associated with the acute/subacute infarct. Multiple other remote nonhemorrhagic lacunar infarcts are present in the basal ganglia bilaterally. Remote lacunar infarcts extend into the corona radiata. Remote lacunar infarcts are also present the thalami bilaterally. Remote lacunar infarcts are present within the cerebellum bilaterally. Brainstem is unremarkable. Internal auditory canals are within normal limits bilaterally. Ventricles are proportionate to the degree of atrophy. No significant extra-axial fluid collection is present. Vascular: Flow is present in the major intracranial arteries. Skull and upper cervical spine: Skull base is within normal limits. Degenerative disc disease present at C2-3. Craniocervical junction is otherwise normal. Sinuses/Orbits: Mild mucosal thickening is present in the left maxillary sinus, bilateral sphenoid sinuses  and ethmoid air cells. Mucosal thickening extends into the left frontal sinus. No fluid levels are present. Mastoid air cells are clear. Globes and orbits are within normal limits bilaterally. IMPRESSION: 1. Acute/subacute nonhemorrhagic infarct involving the posterior right lentiform nucleus,  posterior limb right internal capsule, and dorsal caudate. 2. Remote hemorrhagic infarct of the right lentiform nucleus just anterior to the area of acute/subacute infarction. 3. Multiple other remote lacunar infarcts are present within the basal ganglia bilaterally, the thalami bilaterally, and the cerebellum bilaterally. Electronically Signed   By: Marin Roberts M.D.   On: 01/25/2018 07:46   Ct Cerebral Perfusion W Contrast  Result Date: 01/24/2018 CLINICAL DATA:  Left-sided weakness.  Code stroke. EXAM: CT ANGIOGRAPHY HEAD AND NECK CT PERFUSION BRAIN TECHNIQUE: Multidetector CT imaging of the head and neck was performed using the standard protocol during bolus administration of intravenous contrast. Multiplanar CT image reconstructions and MIPs were obtained to evaluate the vascular anatomy. Carotid stenosis measurements (when applicable) are obtained utilizing NASCET criteria, using the distal internal carotid diameter as the denominator. Multiphase CT imaging of the brain was performed following IV bolus contrast injection. Subsequent parametric perfusion maps were calculated using RAPID software. CONTRAST:  ISOVUE-370 IOPAMIDOL (ISOVUE-370) INJECTION 76% COMPARISON:  Head CT earlier same day. FINDINGS: CTA NECK FINDINGS Aortic arch: Aortic atherosclerosis. No aneurysm or dissection. Left vertebral artery originates from the arch. Right carotid system: Common carotid artery is widely patent to the bifurcation region. Mild soft and calcified plaque at the carotid bifurcation and ICA bulb but no stenosis. Cervical ICA widely patent. The patient has the unusual variation of a small branch vessel from the cervical ICA that appears to serve external carotid territory. Left carotid system: Common carotid artery widely patent to the bifurcation region. Soft and calcified plaque at the ICA bifurcation. Minimal diameter at the distal bulb is 4 mm. Compared to a more distal cervical ICA diameter of 5 mm,  this indicates only a 20% stenosis. Vertebral arteries: Right vertebral artery is dominant. Right vertebral artery origin is widely patent. The vessel is widely patent through the cervical region to the foramen magnum. Left vertebral artery arises from the arch. This is a small vessel that appears normally patent through the cervical region to the foramen magnum. Skeleton: Ordinary cervical spondylosis. Other neck: No mass or lymphadenopathy. Upper chest: Mild apical scarring.  No active process. Review of the MIP images confirms the above findings CTA HEAD FINDINGS Anterior circulation: Both internal carotid arteries are patent through the skull base and siphon regions. There is atherosclerotic change within the siphon regions but no stenosis greater than 30% suspected. The anterior and middle cerebral vessels are patent. No large or medium vessel occlusion. There is 50% stenosis of the proximal M2 segment on the right immediately past the origin the temporal branch. I do not see any occluded or missing branches. Posterior circulation: Both vertebral arteries are patent through the foramen magnum to the basilar. The left is diminutive. No basilar stenosis. Posterior circulation branch vessels are patent. Right PCA has fetal origin from the anterior circulation. Venous sinuses: Patent and normal. Anatomic variants: None other significant. Delayed phase: No abnormal enhancement. Review of the MIP images confirms the above findings CT Brain Perfusion Findings: CBF (<30%) Volume: 0mL Perfusion (Tmax>6.0s) volume: 0mL Mismatch Volume: 0mL Infarction Location:None IMPRESSION: Negative perfusion study. Atherosclerosis of the aortic arch. Atherosclerosis at both carotid bifurcations and ICA bulb regions. No stenosis on the right. 20% stenosis in the left ICA bulb.  Intracranial atherosclerotic irregularity. 50% stenosis of the proximal M2 segment on the right immediately past the anterior temporal branch. The study does not  show any occluded large or medium vessels. These results were communicated to Dr. Wilford Corner At 9:14 amon 12/5/2019by text page via the [facility] messaging system. Electronically Signed   By: Paulina Fusi M.D.   On: 01/24/2018 09:15   Dg Chest Port 1 View  Result Date: 01/25/2018 CLINICAL DATA:  Respiratory difficulty EXAM: PORTABLE CHEST 1 VIEW COMPARISON:  02/19/2011 FINDINGS: Cardiac shadow is mildly enlarged but accentuated by the portable technique. The lungs are well aerated bilaterally without focal infiltrate or sizable effusion. No acute bony abnormality is seen. IMPRESSION: No active disease. Electronically Signed   By: Alcide Clever M.D.   On: 01/25/2018 07:31   Dg Knee Left Port  Result Date: 01/25/2018 CLINICAL DATA:  Post multiple falls with left knee pain and swelling. EXAM: PORTABLE LEFT KNEE - 1-2 VIEW COMPARISON:  None. FINDINGS: No definite fracture or dislocation. Moderate in size high density suprapatellar joint effusion. Overlying soft tissue swelling anterior to the patella. Chronic appearing arthritic changes in all 3 compartments with joint space narrowing, subchondral sclerosis, chondrocalcinosis and small osteophyte formation. IMPRESSION: No fractures seen radiographically. High density suprapatellar joint effusion which is suggestive of radio occult fracture or internal derangement of the knee. 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 Electronically Signed   By: Ted Mcalpine M.D.   On: 01/25/2018 11:32   Dg Hip Unilat With Pelvis 2-3 Views Left  Result Date: 01/25/2018 CLINICAL DATA:  Multiple recent falls due to her recent stroke. Generalize left lower extremity pain. No visible bruising. EXAM: DG HIP (WITH OR WITHOUT PELVIS) 2-3V LEFT COMPARISON:  Coronal and sagittal reconstructed images through the pelvis and hips from an abdominal CT scan of January 06, 2008 FINDINGS: The observed portions of the bony pelvis are  subjectively adequately mineralized. There is no lytic or blastic lesion. There is contrast within the urinary bladder. There multiple pelvic phleboliths. AP and lateral views of the left hip reveal mild symmetric narrowing of the left hip joint space. Small spurs arise from the articular margins of the acetabulum. The femoral head, neck, intertrochanteric, and immediate subtrochanteric regions are normal. IMPRESSION: There is no acute bony abnormality of the left hip or visualized portions of the pelvis. Mild symmetric narrowing of the left hip joint space. Electronically Signed   By: David  [place] M.D.   On: 01/25/2018 11:28   Ct Head Code Stroke Wo Contrast  Result Date: 01/24/2018 CLINICAL DATA:  Code stroke.  Left-sided weakness.  Code stroke. EXAM: CT HEAD WITHOUT CONTRAST TECHNIQUE: Contiguous axial images were obtained from the base of the skull through the vertex without intravenous contrast. COMPARISON:  None. FINDINGS: Brain: Age related atrophy. Mild chronic small-vessel ischemic change of the hemispheric white matter. No sign of acute parenchymal infarction. Old small vessel infarctions affect the thalami, basal ganglia and external capsule regions. No mass, hemorrhage, hydrocephalus or extra-axial collection. Vascular: There is atherosclerotic calcification of the major vessels at the base of the brain. 1 could question a small hyperdense focus in the right MCA bifurcation region. This is certainly not definite evidence acute embolic disease. Skull: Normal Sinuses/Orbits: Clear/normal Other: None ASPECTS ([place]berta Stroke Program Early CT Score) - Ganglionic level infarction (caudate, lentiform nuclei, internal capsule, insula, M1-M3 cortex): 7 - Supraganglionic infarction (M4-M6 cortex): 3 Total score (0-10 with 10 being normal): 10 IMPRESSION:  1. No acute parenchymal finding. Chronic small-vessel ischemic changes. 2. ASPECTS is 10. 3. One could question a focal hyperdensity at the right MCA  bifurcation, but this is not definite. 4. This result was communicated by pager to the stroke service. Electronically Signed   By: Paulina FusiMark  Shogry M.D.   On: 01/24/2018 08:50   TTE pending   PHYSICAL EXAM  Temp:  [97.7 F (36.5 C)-98.4 F (36.9 C)] 98 F (36.7 C) (12/09 0745) Pulse Rate:  [63-85] 63 (12/09 0745) Resp:  [16-20] 18 (12/09 0745) BP: (155-183)/(69-82) 174/82 (12/09 0745) SpO2:  [94 %-99 %] 99 % (12/09 0745)  General - obese elderly Caucasian male   Ophthalmologic - fundi not visualized due to noncooperation.  Cardiovascular - Regular rate and rhythm.  Mental Status -  Level of arousal and orientation to time, place, and person were intact. Language including expression, naming, repetition, comprehension was assessed and found intact, mild dysarthria  Cranial Nerves II - XII - II - Visual field intact OU. III, IV, VI - Extraocular movements intact. V - Facial sensation intact bilaterally. VII - mild left facial droop. VIII - Hearing & vestibular intact bilaterally. X - Palate elevates symmetrically, mild dysarthria. XI - Chin turning & shoulder shrug intact bilaterally. XII - Tongue protrusion intact.  Motor Strength - The patient's strength was normal in RUE and RLE but left UE 1/5 proximal and 0/5 distally, LLE 2/5 proximal and distal.  Bulk was normal and fasciculations were absent.   Motor Tone - Muscle tone was assessed at the neck and appendages and was mildly elevated at LUE and LLE.  Reflexes - The patient's reflexes were symmetrical in all extremities and he had left babinski.  Sensory - Light touch, temperature/pinprick were assessed and were symmetrical.    Coordination - The patient had normal movements in the right hand with no ataxia or dysmetria.  Tremor was present on the right hand, resting and action tremor   Gait and Station - deferred.   ASSESSMENT/PLAN Wayne Griffin is a 72 y.o. male with history of left knee surgery many years ago, not  seeing doctor for years admitted for left sided weakness.TPA given.    Stroke:  right BG/CR infarct likely secondary to small vessel disease    Resultant left sided weakness and dysarthria  MRI  Right BG/CR infarct  CT no acute abnormality  CTA head and neck right M1 50% stenosis, no LVO  2D Echo  Normal ejection fraction of 55-60% with mild LVH.  LDL 115  HgbA1c 5.3  lovenox for VTE prophylaxis  On diet  No antithrombotic prior to admission, now on aspirin 81 mg daily and clopidogrel 75 mg daily. DAPT for 3 weeks and then ASA alone  Patient counseled to be compliant with his antithrombotic medications  Ongoing aggressive stroke risk factor management  Therapy recommendations:  Pending   Disposition:  Pending   Hypertension . Stable  . Now off cleviprex . Add amlodipine 10  BP goal < 180/105  Long term BP goal normotensive  Hyperlipidemia  Home meds:  none   LDL 115, goal < 70  Now on lipitor 40  Continue statin at discharge  Left knee / hip pain  Fall x 2 at home  Left hip and knee X-ray to rule out fracture  Left knee swelling with tenderness on palpation   Left knee surgery many years ago  Tramadol PRN  Do not feel septic arthritis   Will consider empiric ?? gout  treatment ?? If no fracture  Other Stroke Risk Factors  Advanced age  Obesity, There is no height or weight on file to calculate BMI.   Other Active Problems  Elevated Cre 1.68->1.50->1.36  Leukocytosis WBC 15.7 -> 9.4 - CXR neg, afebrile   Hospital day # 4 I have personally examined this patient, reviewed notes, independently viewed imaging studies, participated in medical decision making and plan of care.ROS completed by me personally and pertinent positives fully documented  I have made any additions or clarifications directly to the above note. Plan inpatient rehabilitation consult and transfer there over the next few days. Continue present treatment. Greater than 50%  time during this 25 minute visit was spent on coordination of care about his stroke and pending treatment plan and care Delia Heady, MD Medical Director [facility] Pager: [phone] 01/28/2018 8:33 AM  Delia Heady, MD Stroke Neurology 01/28/2018 8:33 AM    To contact Stroke Continuity provider, please refer to [email or web address]. After hours, contact General Neurology

## 2018-01-28 NOTE — Care Management Note (Signed)
Case Management Note  Patient Details  Name: Ernest HaberGary Alcantar MRN: [id number] Date of Birth: [birth year]  Subjective/Objective:                    Action/Plan: Pt discharging to CIR today. CM signing off.   Expected Discharge Date:  01/28/18               Expected Discharge Plan:  IP Rehab Facility  In-House Referral:     Discharge planning Services  CM Consult  Post Acute Care Choice:    Choice offered to:     DME Arranged:    DME Agency:     HH Arranged:    HH Agency:     Status of Service:  Completed, signed off  If discussed at [employer] Meetings, dates discussed:    Additional Comments:  Kermit BaloKelli F Alroy Portela, RN 01/28/2018, 1:23 PM

## 2018-01-28 NOTE — Progress Notes (Signed)
Marcello Fennel, MD  Physician  Physical Medicine and Rehabilitation  PMR Pre-admission  Addendum  Date of Service:  01/28/2018 12:57 PM       Related encounter: ED to Hosp-Admission (Discharged) from 01/24/2018 in [facility]         Show:Clear all [x] Manual[x] Template[x] Copied  Added by: [x] Koleen Distance Ellouise Newer, RN  [] Hover for details PMR Admission Coordinator Pre-Admission Assessment  Patient: Wayne Griffin is an 72 y.o., male MRN: [id number] DOB: [birth year] Height:   Weight: 110 kg                                                                                                                                                  Insurance Information HMO: No    PPO:       PCP:       IPA:       80/20:       OTHER:   PRIMARY:  Medicare A and B      Policy#: [id number]      Subscriber: patient CM Name:        Phone#:       Fax#:   Pre-Cert#:        Employer: Retired Benefits:  Phone #:       Name: Checked in [employer]. 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 Per chart review patient lives with son. Independent prior to admission using a cane. Son works during the day. One level home with 2-3 steps to entry. Presented 01/24/2018 with left facial droop and  left-sided weakness with dysarthria. Cranial CT scan negative. There was some question of focal hyperdensity at the right MCA bifurcation. Patient did receive TPA. CT angiogram of head and neck negative perfusion study.No large vessel occlusion or stenosis. MRI showed acute/subacute nonhemorrhagic infarction involving the posterior right lentiform nucleus, posterior limb right internal capsule and dorsal caudate. Remote hemorrhagic infarct of  the right lentiform nucleus just anterior to the area of the acute subacute infarction. Echocardiogram with ejection fraction of 60% grade 1 diastolic dysfunction. Neurology follow-up placed on aspirin and Plavix therapy 3 weeks then aspirin alone.subcutaneous Lovenox for DVT prophylaxis. Tolerating a mechanical soft diet. Blood pressures monitored initially in need of Cleviprex since improved. Therapy evaluations completed with recommendations of physical medicine rehabilitation consult. Patient to be admitted for a comprehensive inpatient rehabilitation program.  Complete NIHSS TOTAL: 8  Past Medical History  History reviewed. No pertinent past medical history.  Family History  family history includes Hypertension in his father and mother; Stroke in his mother.  Prior Rehab/Hospitalizations: No previous rehab admissions.  Has the patient had major surgery during 100 days prior to admission? No  Current Medications   Current Facility-Administered Medications:  .   stroke: mapping our early stages of recovery book, , Does not apply, Once, Ulice Dash, PA-C .  0.9 %  sodium chloride infusion, , Intravenous, Continuous, Marvel Plan, MD, Last Rate: 50 mL/hr at 01/27/18 1300 .  acetaminophen (TYLENOL) tablet 650 mg, 650 mg, Oral, Q4H PRN, 650 mg at 01/27/18 1706 **OR** [DISCONTINUED] acetaminophen (TYLENOL) solution 650 mg, 650 mg, Per Tube, Q4H PRN **OR** [DISCONTINUED] acetaminophen (TYLENOL) suppository 650 mg, 650 mg, Rectal, Q4H PRN, Ulice Dash, PA-C, 650 mg at 01/24/18 1010 .  amLODipine (NORVASC) tablet 10 mg, 10 mg, Oral, Daily, Marvel Plan, MD, 10 mg at 01/28/18 0824 .  aspirin EC tablet 81 mg, 81 mg, Oral, Daily, Marvel Plan, MD, 81 mg at 01/28/18 0824 .  atorvastatin (LIPITOR) tablet 40 mg, 40 mg, Oral, q1800, Marvel Plan, MD, 40 mg at 01/27/18 1706 .  [COMPLETED] labetalol (NORMODYNE,TRANDATE) injection 20 mg, 20 mg, Intravenous, Once, 10 mg at 01/24/18 0840 **AND** clevidipine (CLEVIPREX) infusion 0.5 mg/mL, 0-21 mg/hr, Intravenous, Continuous, Ulice Dash, PA-C, Stopped at 01/25/18 [phone] .  clopidogrel (PLAVIX) tablet 75 mg, 75 mg, Oral, Daily, Marvel Plan, MD, 75 mg at 01/28/18 0824 .  enoxaparin (LOVENOX) injection 40 mg, 40 mg, Subcutaneous, Q24H, Marvel Plan, MD, 40 mg at 01/28/18 0824 .  LORazepam (ATIVAN) tablet 0.5 mg, 0.5 mg, Oral, Q8H PRN, Rinehuls, David L, PA-C .  pantoprazole (PROTONIX) EC tablet 40 mg, 40 mg, Oral, Daily, Marvel Plan, MD, 40 mg at 01/28/18 0824 .  senna-docusate (Senokot-S) tablet 1 tablet, 1 tablet, Oral, QHS PRN, Ulice Dash, PA-C .  traMADol Janean Sark) tablet 50 mg, 50 mg, Oral, Q6H PRN, Marvel Plan, MD, 50 mg at 01/28/18 0414  Patients Current Diet:     Diet Order                  DIET DYS 3 Room service appropriate? Yes; Fluid consistency: Thin  Diet effective now               Precautions / Restrictions Precautions Precautions: Fall Precaution Comments: L hemi Restrictions Weight Bearing Restrictions: No   Has the patient had 2 or more falls or a fall with injury in the past year?Yes.  Son reports at least 2 falls this year.  Most recent fall was at the time of current stroke.  Prior Activity Level Limited Community (1-2x/wk): Went out about 1 X a week with his son.  Home Assistive Devices / Equipment Home Assistive Devices/Equipment: Eyeglasses(for reading only) Home Equipment: Cane - single point  Prior Device Use: Indicate devices/aids used by the  patient prior to current illness, exacerbation or injury? Cane, but not  used recently  Prior Functional Level Prior Function Level of Independence: Independent with assistive device(s) Comments: overall independent though had recently begun using [facility] for ambulation  Self Care: Did the patient need help bathing, dressing, using the toilet or eating?  Independent  Indoor Mobility: Did the patient need assistance with walking from room to room (with or without device)? Independent  Stairs: Did the patient need assistance with internal or external stairs (with or without device)? Independent  Functional Cognition: Did the patient need help planning regular tasks such as shopping or remembering to take medications? Independent  Current Functional Level Cognition  Arousal/Alertness: Awake/alert Overall Cognitive Status: Impaired/Different from baseline Current Attention Level: Selective Orientation Level: Oriented X4 Following Commands: Follows one step commands consistently, Follows one step commands with increased time Safety/Judgement: Decreased awareness of safety General Comments: pt with difficulty focusing on task at hand during session when provided with environmental distractions  Attention: Divided Divided Attention: Appears intact Memory: Appears intact Awareness: Appears intact Problem Solving: Appears intact Executive Function: Reasoning, Self Monitoring, Self Correcting Reasoning: Appears intact Self Monitoring: Appears intact Self Correcting: Appears intact Safety/Judgment: Appears intact    Extremity Assessment (includes Sensation/Coordination)  Upper Extremity Assessment: LUE deficits/detail LUE Deficits / Details: flaccid LUE, grossly 1/5  LUE Sensation: decreased light touch LUE Coordination: decreased fine motor, decreased gross motor  Lower Extremity Assessment: LLE deficits/detail LLE Deficits / Details: trace gross flexion and 2/5 gross extension.   Pt unable to bear weight with control.  painful knee. LLE Sensation: (light touch available) LLE Coordination: decreased fine motor, decreased gross motor    ADLs  Overall ADL's : Needs assistance/impaired Eating/Feeding: Set up, Minimal assistance, Bed level, Sitting Grooming: Set up, Minimal assistance, Sitting Upper Body Bathing: Moderate assistance, Sitting Lower Body Bathing: Maximal assistance, Sitting/lateral leans Upper Body Dressing : Moderate assistance, Sitting Lower Body Dressing: Maximal assistance, Sitting/lateral leans General ADL Comments: pt presenting with L side weakness, decreased sitting and standing balance    Mobility  Overal bed mobility: Needs Assistance Bed Mobility: Supine to Sit Supine to sit: Mod assist Sit to supine: Mod assist General bed mobility comments: Pt requires assistance with LLE to bring over EOB. Pt also requires additional assistance with trunk elevation and stability. Pt able to provide assistance with RUE by pulling on bed rails. VC given for sequencing.      Transfers  Overall transfer level: Needs assistance Equipment used: 2 person hand held assist Transfers: Sit to/from Stand, [employer] GroupSquat Pivot Transfers Sit to Stand: Mod assist, +2 physical assistance Squat pivot transfers: Max assist, +2 physical assistance  Lateral/Scoot Transfers: Mod assist, +2 physical assistance General transfer comment: Pt required mod A x2 for power initiation and stability. Pt needs consistent blocking of LLE to prevent buckling. Pt demonstrates increased L lateral lean when completing sit to stands, multimodal cueing given to correct back to midline. Pt requires max A x2 for squat pivot, with LLE blocking throughout. Pt requires physical assistance for placement of LLE prior to sit to stand and squat pivot.     Ambulation / Gait / Stairs / Engineer, drillingWheelchair Mobility       Posture / Balance Dynamic Sitting Balance Sitting balance - Comments: Pt requires overall  min A during sitting balance with RUE support. Pt demonstrates increased L lateral lean, and is able to correct to midline after multimodal cueing. Pt able to tolerate periods of balance with min guard and with no UE support.  Balance Overall balance assessment: Needs assistance Sitting-balance support:  Single extremity supported, Feet supported, No upper extremity supported Sitting balance-Leahy Scale: Fair Sitting balance - Comments: Pt requires overall min A during sitting balance with RUE support. Pt demonstrates increased L lateral lean, and is able to correct to midline after multimodal cueing. Pt able to tolerate periods of balance with min guard and with no UE support.  Postural control: Left lateral lean Standing balance support: Bilateral upper extremity supported, During functional activity Standing balance-Leahy Scale: Poor Standing balance comment: Pt requires 2 person mod-max A in order to facilitate lateral and anterior weight shifts. Pt shows decreased awareness of midline, requiring multimodal cueing for correction. Pt demonstrates continued inability to take anterior or lateral steps.     Special needs/care consideration BiPAP/CPAP No CPM No Continuous Drip IV 0.9% NS at 50 mL/hr  Dialysis No    Life Vest No Oxygen No Special Bed No Trach Size No Wound Vac (area) No   Skin No                           Bowel mgmt: No BM since admission on 01/24/18 Bladder mgmt: External catheter in place Diabetic mgmt No    Previous Home Environment Living Arrangements: Children(grandchildren)  Lives With: Son Available Help at Discharge: Family Type of Home: House Home Layout: One level Home Access: Stairs to enter Entrance Stairs-Rails: None Secretary/administrator of Steps: 2-3 Bathroom Shower/Tub: Health visitor: Standard Home Care Services: No  Discharge Living Setting Plans for Discharge Living Setting: House, Lives with (comment)(Lives with his  son.) Type of Home at Discharge: House Discharge Home Layout: Two level, Able to live on main level with bedroom/bathroom(Grandson lives upstairs.) Alternate Level Stairs-Number of Steps: Flight Discharge Home Access: Stairs to enter Secretary/administrator of Steps: 3 steps Discharge Bathroom Shower/Tub: Walk-in shower, Door Discharge Bathroom Toilet: Standard Discharge Bathroom Accessibility: Yes How Accessible: Accessible via walker Does the patient have any problems obtaining your medications?: No  Social/Family/Support Systems Patient Roles: Parent(Has a son and grandsons.) Contact Information: Wayne Griffin - son Anticipated Caregiver: Son and family Anticipated Caregiver's Contact Information: Wayne Griffin - son - [phone] Ability/Limitations of Caregiver: Son works, grandson works. Caregiver Availability: Intermittent(Son aware of need for caregiver support after rehab D/C) Discharge Plan Discussed with Primary Caregiver: Yes Is Caregiver In Agreement with Plan?: Yes Does Caregiver/Family have Issues with Lodging/Transportation while Pt is in Rehab?: No  Goals/Additional Needs Patient/Family Goal for Rehab: PT/OT min assist, SLP mod I goals Expected length of stay: 18-21 days Cultural Considerations: None Dietary Needs: Dys 3, thin liquids Equipment Needs: TBD Pt/Family Agrees to Admission and willing to participate: Yes(I spoke with son by phone.) Program Orientation Provided & Reviewed with Pt/Caregiver Including Roles  & Responsibilities: Yes  Decrease burden of Care through IP rehab admission: N/A  Possible need for SNF placement upon discharge: Yes, if son cannot establish a caregiver support system for after rehab care.  Patient Condition: This patient's condition remains as documented in the consult dated 01/27/18, in which the Rehabilitation Physician determined and documented that the patient's condition is appropriate for intensive rehabilitative care in an  inpatient rehabilitation facility. Will admit to inpatient rehab today.  Preadmission Screen Completed By:  Trish Mage, 01/28/2018 1:06 PM ______________________________________________________________________   Discussed status with Dr. Allena Katz on 01/28/18 at 1306 and received telephone approval for admission today.  Admission Coordinator:  Trish Mage, time 1306/Date 01/28/18       Revision History

## 2018-01-28 NOTE — PMR Pre-admission (Addendum)
PMR Admission Coordinator Pre-Admission Assessment  Patient: Wayne Griffin is an 72 y.o., male MRN: [id number] DOB: Jan 22, 1946 Height:   Weight: 110 kg              Insurance Information HMO: No    PPO:       PCP:       IPA:       80/20:       OTHER:   PRIMARY:  Medicare A and B      Policy#: [id number]      Subscriber: patient CM Name:        Phone#:       Fax#:   Pre-Cert#:        Employer: Retired Benefits:  Phone #:       Name: Checked in [employer]. 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 Per chart review patient lives with son. Independent prior to admission using a cane. Son works during the day. One level home with 2-3 steps to entry. Presented 01/24/2018 with left facial droop and left-sided weakness with dysarthria. Cranial CT scan negative. There was some question of focal hyperdensity at the right MCA bifurcation. Patient did receive TPA. CT angiogram of head and neck negative perfusion study.No large vessel occlusion or stenosis. MRI showed acute/subacute nonhemorrhagic infarction involving the posterior right lentiform nucleus, posterior limb right internal capsule and dorsal caudate. 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 Echocardiogram with ejection fraction of 60% grade 1 diastolic dysfunction. Neurology follow-up placed on aspirin and Plavix therapy 3 weeks then aspirin alone.subcutaneous Lovenox for DVT prophylaxis. Tolerating a mechanical soft diet. Blood pressures monitored initially in need of Cleviprex since improved. Therapy evaluations completed with recommendations of physical medicine rehabilitation consult. Patient to be admitted for a comprehensive inpatient rehabilitation program.  Complete NIHSS TOTAL: 8  Past Medical History  History reviewed. No pertinent past medical history.  Family History  family history includes Hypertension in his father and mother; Stroke in his mother.  Prior Rehab/Hospitalizations: No previous rehab admissions.  Has the patient had major surgery during 100 days prior to admission? No  Current Medications   Current Facility-Administered Medications:  .   stroke: mapping our early stages of recovery book, , Does not apply, Once, Ulice Dash, PA-C .  0.9 %  sodium chloride infusion, , Intravenous, Continuous, Marvel Plan, MD, Last Rate: 50 mL/hr at 01/27/18 1300 .  acetaminophen (TYLENOL) tablet 650 mg, 650 mg, Oral, Q4H PRN, 650 mg at 01/27/18 1706 **OR** [DISCONTINUED] acetaminophen (TYLENOL) solution 650 mg, 650  mg, Per Tube, Q4H PRN **OR** [DISCONTINUED] acetaminophen (TYLENOL) suppository 650 mg, 650 mg, Rectal, Q4H PRN, Ulice Dash, PA-C, 650 mg at 01/24/18 1010 .  amLODipine (NORVASC) tablet 10 mg, 10 mg, Oral, Daily, Marvel Plan, MD, 10 mg at 01/28/18 0824 .  aspirin EC tablet 81 mg, 81 mg, Oral, Daily, Marvel Plan, MD, 81 mg at 01/28/18 0824 .  atorvastatin (LIPITOR) tablet 40 mg, 40 mg, Oral, q1800, Marvel Plan, MD, 40 mg at 01/27/18 1706 .  [COMPLETED] labetalol (NORMODYNE,TRANDATE) injection 20 mg, 20 mg, Intravenous, Once, 10 mg at 01/24/18 0840 **AND** clevidipine (CLEVIPREX) infusion 0.5 mg/mL, 0-21 mg/hr, Intravenous, Continuous, Ulice Dash, PA-C, Stopped at 01/25/18 [id number] .  clopidogrel (PLAVIX) tablet 75 mg, 75 mg, Oral, Daily, Marvel Plan, MD, 75 mg at 01/28/18 0824 .  enoxaparin (LOVENOX) injection 40 mg, 40 mg, Subcutaneous, Q24H, Marvel Plan, MD, 40 mg at 01/28/18 0824 .  LORazepam (ATIVAN) tablet 0.5 mg, 0.5 mg, Oral, Q8H PRN, Rinehuls, David L, PA-C .  pantoprazole (PROTONIX) EC tablet 40 mg, 40 mg, Oral, Daily, Marvel Plan, MD, 40 mg at 01/28/18 0824 .  senna-docusate (Senokot-S) tablet 1 tablet, 1 tablet, Oral, QHS PRN, Ulice Dash, PA-C .  traMADol Janean Sark) tablet 50 mg, 50 mg, Oral, Q6H PRN, Marvel Plan, MD, 50 mg at 01/28/18 0414  Patients Current Diet:  Diet Order            DIET DYS 3 Room service appropriate? Yes; Fluid consistency: Thin  Diet effective now              Precautions / Restrictions Precautions Precautions: Fall Precaution Comments: L hemi Restrictions Weight Bearing Restrictions: No   Has the patient had 2 or more falls or a fall with injury in the past year?Yes.  Son reports at least 2 falls this year.  Most recent fall was at the time of current stroke.  Prior Activity Level Limited Community (1-2x/wk): Went out about 1 X a week with his son.  Home Assistive Devices / Equipment Home Assistive Devices/Equipment: Eyeglasses(for reading only) Home Equipment: Cane - single point  Prior Device Use: Indicate devices/aids used by the patient prior to current illness, exacerbation or injury? Cane, but not used recently  Prior Functional Level Prior Function Level of Independence: Independent with assistive device(s) Comments: overall independent though had recently begun using [facility] for ambulation  Self Care: Did the patient need help bathing, dressing, using the toilet or eating?  Independent  Indoor Mobility: Did the patient need assistance with walking from room to room (with or without device)? Independent  Stairs: Did the patient need assistance with internal or  external stairs (with or without device)? Independent  Functional Cognition: Did the patient need help planning regular tasks such as shopping or remembering to take medications? Independent  Current Functional Level Cognition  Arousal/Alertness: Awake/alert Overall Cognitive Status: Impaired/Different from baseline Current Attention Level: Selective Orientation Level: Oriented X4 Following Commands: Follows one step commands consistently, Follows one step commands with increased time Safety/Judgement: Decreased awareness of safety General Comments: pt with difficulty focusing on task at hand during session when provided with environmental distractions  Attention: Divided Divided Attention: Appears intact Memory: Appears intact Awareness: Appears intact Problem Solving: Appears intact Executive Function: Reasoning, Self Monitoring, Self Correcting Reasoning: Appears intact Self Monitoring: Appears intact Self Correcting: Appears intact Safety/Judgment: Appears intact    Extremity Assessment (includes Sensation/Coordination)  Upper Extremity Assessment: LUE deficits/detail LUE Deficits / Details: flaccid LUE, grossly 1/5  LUE Sensation: decreased light touch LUE Coordination: decreased fine motor, decreased gross motor  Lower Extremity Assessment: LLE deficits/detail LLE Deficits / Details: trace gross flexion and 2/5 gross extension.  Pt unable to bear weight with control.  painful knee. LLE Sensation: (light touch available) LLE Coordination: decreased fine motor, decreased gross motor    ADLs  Overall ADL's : Needs assistance/impaired Eating/Feeding: Set up, Minimal assistance, Bed level, Sitting Grooming: Set up, Minimal assistance, Sitting Upper Body Bathing: Moderate assistance, Sitting Lower Body Bathing: Maximal assistance, Sitting/lateral leans Upper Body Dressing : Moderate assistance, Sitting Lower Body Dressing: Maximal assistance, Sitting/lateral leans General  ADL Comments: pt presenting with L side weakness, decreased sitting and standing balance    Mobility  Overal bed mobility: Needs Assistance Bed Mobility: Supine to Sit Supine to sit: Mod assist Sit to supine: Mod assist General bed mobility comments: Pt requires assistance with LLE to bring over EOB. Pt also requires additional assistance with trunk elevation and stability. Pt able to provide assistance with RUE by pulling on bed rails. VC given for sequencing.      Transfers  Overall transfer level: Needs assistance Equipment used: 2 person hand held assist Transfers: Sit to/from Stand, Alt[employer] GroupSquat Pivot Transfers Sit to Stand: Mod assist, +2 physical assistance Squat pivot transfers: Max assist, +2 physical assistance  Lateral/Scoot Transfers: Mod assist, +2 physical assistance General transfer comment: Pt required mod A x2 for power initiation and stability. Pt needs consistent blocking of LLE to prevent buckling. Pt demonstrates increased L lateral lean when completing sit to stands, multimodal cueing given to correct back to midline. Pt requires max A x2 for squat pivot, with LLE blocking throughout. Pt requires physical assistance for placement of LLE prior to sit to stand and squat pivot.     Ambulation / Gait / Stairs / Engineer, drillingWheelchair Mobility       Posture / Balance Dynamic Sitting Balance Sitting balance - Comments: Pt requires overall min A during sitting balance with RUE support. Pt demonstrates increased L lateral lean, and is able to correct to midline after multimodal cueing. Pt able to tolerate periods of balance with min guard and with no UE support.  Balance Overall balance assessment: Needs assistance Sitting-balance support: Single extremity supported, Feet supported, No upper extremity supported Sitting balance-Leahy Scale: Fair Sitting balance - Comments: Pt requires overall min A during sitting balance with RUE support. Pt demonstrates increased L lateral lean, and is able to  correct to midline after multimodal cueing. Pt able to tolerate periods of balance with min guard and with no UE support.  Postural control: Left lateral lean Standing balance support: Bilateral upper extremity supported, During functional activity Standing balance-Leahy Scale: Poor Standing balance comment: Pt requires 2 person mod-max A in order to facilitate lateral and anterior weight shifts. Pt shows decreased awareness of midline, requiring multimodal cueing for correction. Pt demonstrates continued inability to take anterior or lateral steps.     Special needs/care consideration BiPAP/CPAP No CPM No Continuous Drip IV 0.9% NS at 50 mL/hr  Dialysis No    Life Vest No Oxygen No Special Bed No Trach Size No Wound Vac (area) No   Skin No                           Bowel mgmt: No BM since admission on 01/24/18 Bladder mgmt: External catheter in place Diabetic mgmt No    Previous Home Environment Living Arrangements: Children(grandchildren)  Lives With:  Son Available Help at Discharge: Family Type of Home: House Home Layout: One level Home Access: Stairs to enter Entrance Stairs-Rails: None Secretary/administrator of Steps: 2-3 Bathroom Shower/Tub: Health visitor: Standard Home Care Services: No  Discharge Living Setting Plans for Discharge Living Setting: House, Lives with (comment)(Lives with his son.) Type of Home at Discharge: House Discharge Home Layout: Two level, Able to live on main level with bedroom/bathroom(Grandson lives upstairs.) Alternate Level Stairs-Number of Steps: Flight Discharge Home Access: Stairs to enter Secretary/administrator of Steps: 3 steps Discharge Bathroom Shower/Tub: Walk-in shower, Door Discharge Bathroom Toilet: Standard Discharge Bathroom Accessibility: Yes How Accessible: Accessible via walker Does the patient have any problems obtaining your medications?: No  Social/Family/Support Systems Patient Roles: Parent(Has a  son and grandsons.) Contact Information: Bowe Sidor - son Anticipated Caregiver: Son and family Anticipated Caregiver's Contact Information: Mat Carne - son - [phone] Ability/Limitations of Caregiver: Son works, grandson works. Caregiver Availability: Intermittent(Son aware of need for caregiver support after rehab D/C) Discharge Plan Discussed with Primary Caregiver: Yes Is Caregiver In Agreement with Plan?: Yes Does Caregiver/Family have Issues with Lodging/Transportation while Pt is in Rehab?: No  Goals/Additional Needs Patient/Family Goal for Rehab: PT/OT min assist, SLP mod I goals Expected length of stay: 18-21 days Cultural Considerations: None Dietary Needs: Dys 3, thin liquids Equipment Needs: TBD Pt/Family Agrees to Admission and willing to participate: Yes(I spoke with son by phone.) Program Orientation Provided & Reviewed with Pt/Caregiver Including Roles  & Responsibilities: Yes  Decrease burden of Care through IP rehab admission: N/A  Possible need for SNF placement upon discharge: Yes, if son cannot establish a caregiver support system for after rehab care.  Patient Condition: This patient's condition remains as documented in the consult dated 01/27/18, in which the Rehabilitation Physician determined and documented that the patient's condition is appropriate for intensive rehabilitative care in an inpatient rehabilitation facility. Will admit to inpatient rehab today.  Preadmission Screen Completed By:  Trish Mage, 01/28/2018 1:06 PM ______________________________________________________________________   Discussed status with Dr. Allena Katz on 01/28/18 at 1306 and received telephone approval for admission today.  Admission Coordinator:  Trish Mage, time 1306/Date 01/28/18

## 2018-01-28 NOTE — Progress Notes (Signed)
Physical Therapy Treatment Patient Details Name: Wayne HaberGary Griffin MRN: [id number] DOB: 05/17/1945 Today's Date: 01/28/2018    History of Present Illness Pt is a 72 y.o. male  with no pertinent history secondary as pt hasn't seen PCP in a very long time.  Per son patient had a period of left-sided arm, leg weakness causing x2 falls and left facial droop PTA.  Pt given tPA on 01/24/2018. MRI shows Acute/subacute infarct involving the posterior right lentiform nucleus, posterior limb right internal capsule, and dorsal caudate with multiple other remote lacunar infarcts. Imaging of L hip/knee completed with no acute fractures.    PT Comments    Pt shows progress towards functional goals. Pt is at an overall level of mod A for bed mobility, and mod-max A x2 for transfers. During standing balance, pt shows decreased midline orientation, with L lateral shift noted. Pt is able to correct back to midline after multimodal cueing. Pt demonstrates inability to complete either anterior or lateral steps with two person support. Pt states a continued feeling of nausea throughout the session, as well as mild dizziness. Current recommendation of CIR remains appropriate. Pt would continue to benefit from acute PT in order to decrease pain, and increase functional mobility.   BP in supine at beginning of session measured at 182/84 mmHg.    Follow Up Recommendations  CIR     Equipment Recommendations  Other (comment)(TBA)    Recommendations for Other Services Rehab consult     Precautions / Restrictions Precautions Precautions: Fall Precaution Comments: L hemi Restrictions Weight Bearing Restrictions: No    Mobility  Bed Mobility Overal bed mobility: Needs Assistance Bed Mobility: Supine to Sit     Supine to sit: Mod assist     General bed mobility comments: Pt requires assistance with LLE to bring over EOB. Pt also requires additional assistance with trunk elevation and stability. Pt able to provide  assistance with RUE by pulling on bed rails. VC given for sequencing.    Transfers Overall transfer level: Needs assistance Equipment used: 2 person hand held assist Transfers: Sit to/from [employer] CorporationStand;Squat Pivot Transfers Sit to Stand: Mod assist;+2 physical assistance   Squat pivot transfers: Max assist;+2 physical assistance     General transfer comment: Pt required mod A x2 for power initiation and stability. Pt needs consistent blocking of LLE to prevent buckling. Pt demonstrates increased L lateral lean when completing sit to stands, multimodal cueing given to correct back to midline. Pt requires max A x2 for squat pivot, with LLE blocking throughout. Pt requires physical assistance for placement of LLE prior to sit to stand and squat pivot.   Ambulation/Gait                 Stairs             Wheelchair Mobility    Modified Rankin (Stroke Patients Only) Modified Rankin (Stroke Patients Only) Pre-Morbid Rankin Score: No symptoms Modified Rankin: Severe disability     Balance Overall balance assessment: Needs assistance Sitting-balance support: Single extremity supported;Feet supported;No upper extremity supported Sitting balance-Leahy Scale: Fair Sitting balance - Comments: Pt requires overall min A during sitting balance with RUE support. Pt demonstrates increased L lateral lean, and is able to correct to midline after multimodal cueing. Pt able to tolerate periods of balance with min guard and with no UE support.  Postural control: Left lateral lean Standing balance support: Bilateral upper extremity supported;During functional activity Standing balance-Leahy Scale: Poor Standing balance comment: Pt requires 2  person mod-max A in order to facilitate lateral and anterior weight shifts. Pt shows decreased awareness of midline, requiring multimodal cueing for correction. Pt demonstrates continued inability to take anterior or lateral steps.                              Cognition Arousal/Alertness: Awake/alert Behavior During Therapy: Anxious Overall Cognitive Status: Impaired/Different from baseline Area of Impairment: Following commands;Safety/judgement;Problem solving;Awareness                       Following Commands: Follows one step commands consistently;Follows one step commands with increased time Safety/Judgement: Decreased awareness of safety Awareness: Emergent Problem Solving: Requires tactile cues;Requires verbal cues;Difficulty sequencing        Exercises      General Comments        Pertinent Vitals/Pain Pain Assessment: 0-10 Pain Score: 6  Pain Location: L knee/hip Pain Descriptors / Indicators: Aching Pain Intervention(s): Monitored during session    Home Living                      Prior Function            PT Goals (current goals can now be found in the care plan section) Acute Rehab PT Goals Patient Stated Goal: return to independence PT Goal Formulation: With patient Time For Goal Achievement: 02/08/18 Potential to Achieve Goals: Good Progress towards PT goals: Progressing toward goals    Frequency    Min 4X/week      PT Plan Current plan remains appropriate    Co-evaluation              AM-PAC PT "6 Clicks" Mobility   Outcome Measure  Help needed turning from your back to your side while in a flat bed without using bedrails?: A Lot Help needed moving from lying on your back to sitting on the side of a flat bed without using bedrails?: A Lot Help needed moving to and from a bed to a chair (including a wheelchair)?: A Lot Help needed standing up from a chair using your arms (e.g., wheelchair or bedside chair)?: A Lot Help needed to walk in hospital room?: Total Help needed climbing 3-5 steps with a railing? : Total 6 Click Score: 10    End of Session Equipment Utilized During Treatment: Gait belt Activity Tolerance: Patient tolerated treatment well Patient  left: in chair;with call bell/phone within reach;with chair alarm set Nurse Communication: Mobility status PT Visit Diagnosis: Unsteadiness on feet (R26.81);Other abnormalities of gait and mobility (R26.89);Hemiplegia and hemiparesis Hemiplegia - Right/Left: Left Hemiplegia - dominant/non-dominant: Non-dominant Hemiplegia - caused by: Cerebral infarction     Time: 0939-1006 PT Time Calculation (min) (ACUTE ONLY): 27 min  Charges:  $Gait Training: 23-37 mins                     Rolm Bookbinder, SPT Acute Rehab [phone] (pager) [phone] (office)    Bekki Tavenner 01/28/2018, 1:49 PM

## 2018-01-28 NOTE — H&P (Signed)
Physical Medicine and Rehabilitation Admission H&P    Chief Complaint  Patient presents with  . Code Stroke  : HPI: . Wayne Griffin is a 72 year old right-handed male with history of hypertension and tobacco abuse on no prescription medications. Per chart review and patient, patient lives with son. Independent prior to admission using a cane. Son works during the day. One level home with 2-3 steps to entry. Presented 01/24/2018 with left facial droop and left-sided weakness with dysarthria as well as a fall. Cranial CT reviewed, unremarkable for acute intracranial process.  Per report, there was some question of focal hyperdensity at the right MCA bifurcation. Patient did receive TPA. CT angiogram of head and neck negative perfusion study.No large vessel occlusion or stenosis. MRI showed acute/subacute nonhemorrhagic infarction involving the posterior right lentiform nucleus, posterior limb right internal capsule and dorsal caudate. Remote hemorrhagic infarct of the right lentiform nucleus just anterior to the area of the acute subacute infarction. Echocardiogram with ejection fraction of 60% grade 1 diastolic dysfunction. Neurology follow-up placed on aspirin and Plavix therapy 3 weeks then aspirin alone.subcutaneous Lovenox for DVT prophylaxis. Tolerating a mechanical soft diet. Blood pressures monitored initially in need of Cleviprex since improved. Therapy evaluations completed with recommendations of physical medicine rehabilitation consult. Patient was admitted for a comprehensive rehabilitation program.  Review of Systems  Constitutional: Negative for chills and fever.  HENT: Negative for hearing loss.   Eyes: Negative for blurred vision and double vision.  Respiratory: Negative for cough and shortness of breath.   Cardiovascular: Negative for chest pain, palpitations and leg swelling.  Gastrointestinal: Positive for constipation. Negative for nausea and vomiting.  Genitourinary:  Negative for dysuria and flank pain.  Musculoskeletal: Positive for myalgias.  Skin: Negative for rash.  Neurological: Positive for sensory change, speech change and focal weakness. Negative for seizures.  Psychiatric/Behavioral:       Anxiety  All other systems reviewed and are negative.  Past medical history: Hypertension Past surgical history: Negative Family History  Problem Relation Age of Onset  . Hypertension Mother   . Stroke Mother   . Hypertension Father    Social History:  reports that he has quit smoking. He has never used smokeless tobacco. He reports that he drank alcohol. He reports that he does not use drugs. Allergies: No Known Allergies No medications prior to admission.    Drug Regimen Review Drug regimen was reviewed and remains appropriate with no significant issues identified  Home: Home Living Family/patient expects to be discharged to:: Private residence Living Arrangements: Children(grandchildren) Available Help at Discharge: Family Type of Home: House Home Access: Stairs to enter Secretary/administrator of Steps: 2-3 Entrance Stairs-Rails: None Home Layout: One level Bathroom Shower/Tub: Health visitor: Standard Home Equipment: Medical laboratory scientific officer - single point  Lives With: Son   Functional History: Prior Function Level of Independence: Independent with assistive device(s) Comments: overall independent though had recently begun using SPC for ambulation  Functional Status:  Mobility: Bed Mobility Overal bed mobility: Needs Assistance Bed Mobility: Supine to Sit Supine to sit: Mod assist Sit to supine: Mod assist General bed mobility comments: Pt requires assistance with LLE to bring over EOB. Pt also requires additional assistance with trunk elevation and stability. Pt able to provide assistance with RUE by pulling on bed rails. VC given for sequencing.   Transfers Overall transfer level: Needs assistance Equipment used: 2 person hand  held assist Transfers: Sit to/from Stand, [employer] Transfers Sit to Stand: Mod assist, +2  physical assistance Squat pivot transfers: Max assist, +2 physical assistance  Lateral/Scoot Transfers: Mod assist, +2 physical assistance General transfer comment: Pt required mod A x2 for power initiation and stability. Pt needs consistent blocking of LLE to prevent buckling. Pt demonstrates increased L lateral lean when completing sit to stands, multimodal cueing given to correct back to midline. Pt requires max A x2 for squat pivot, with LLE blocking throughout. Pt requires physical assistance for placement of LLE prior to sit to stand and squat pivot.       ADL: ADL Overall ADL's : Needs assistance/impaired Eating/Feeding: Set up, Minimal assistance, Bed level, Sitting Grooming: Set up, Minimal assistance, Sitting Upper Body Bathing: Moderate assistance, Sitting Lower Body Bathing: Maximal assistance, Sitting/lateral leans Upper Body Dressing : Moderate assistance, Sitting Lower Body Dressing: Maximal assistance, Sitting/lateral leans General ADL Comments: pt presenting with L side weakness, decreased sitting and standing balance  Cognition: Cognition Overall Cognitive Status: Impaired/Different from baseline Arousal/Alertness: Awake/alert Orientation Level: Oriented X4 Attention: Divided Divided Attention: Appears intact Memory: Appears intact Awareness: Appears intact Problem Solving: Appears intact Executive Function: Reasoning, Self Monitoring, Self Correcting Reasoning: Appears intact Self Monitoring: Appears intact Self Correcting: Appears intact Safety/Judgment: Appears intact Cognition Arousal/Alertness: Awake/alert Behavior During Therapy: Anxious Overall Cognitive Status: Impaired/Different from baseline Area of Impairment: Following commands, Safety/judgement, Problem solving, Awareness Current Attention Level: Selective Following Commands: Follows one step commands  consistently, Follows one step commands with increased time Safety/Judgement: Decreased awareness of safety Awareness: Emergent Problem Solving: Requires tactile cues, Requires verbal cues, Difficulty sequencing General Comments: pt with difficulty focusing on task at hand during session when provided with environmental distractions   Physical Exam: Blood pressure (!) 174/82, pulse 63, temperature 98 F (36.7 C), temperature source Oral, resp. rate 18, weight 110 kg, SpO2 99 %. Physical Exam  Vitals reviewed. Constitutional: He is oriented to person, place, and time. He appears well-developed and well-nourished.  HENT:  Bruising under  right eye  Eyes: EOM are normal. Right eye exhibits no discharge. Left eye exhibits no discharge.  Neck: Normal range of motion. No thyromegaly present.  Cardiovascular: Normal rate, regular rhythm and normal heart sounds.  Respiratory: Effort normal and breath sounds normal. No respiratory distress.  GI: Soft. Bowel sounds are normal. He exhibits no distension.  Musculoskeletal:  No edema or tenderness in extremities  Neurological: He is alert and oriented to person, place, and time.  Mild dysarthria but fully intelligible Motor: Right upper extremity/right lower extremity: 5/5 proximal distal Left upper extremity: 0/5 proximal distal Left lower extremity: 1+/5 proximal to distal Sensation diminished light touch left face Left facial weakness  Skin: Skin is warm and dry.  Psychiatric: He has a normal mood and affect. His behavior is normal.    Results for orders placed or performed during the hospital encounter of 01/24/18 (from the past 48 hour(s))  CBC     Status: Abnormal   Collection Time: 01/27/18  5:31 AM  Result Value Ref Range   WBC 6.5 4.0 - 10.5 K/uL   RBC 4.06 (L) 4.22 - 5.81 MIL/uL   Hemoglobin 11.1 (L) 13.0 - 17.0 g/dL   HCT 16.1 (L) 09.6 - 04.5 %   MCV 85.0 80.0 - 100.0 fL   MCH 27.3 26.0 - 34.0 pg   MCHC 32.2 30.0 - 36.0 g/dL     RDW 40.9 81.1 - 91.4 %   Platelets 101 (L) 150 - 400 K/uL    Comment: REPEATED TO VERIFY SPECIMEN CHECKED FOR CLOTS  Immature Platelet Fraction may be clinically indicated, consider ordering this additional test [id number] CONSISTENT WITH PREVIOUS RESULT    nRBC 0.0 0.0 - 0.2 %    Comment: Performed at [facility], [address] [place], [place] [address]  Basic metabolic panel     Status: Abnormal   Collection Time: 01/27/18  5:31 AM  Result Value Ref Range   Sodium 138 135 - 145 mmol/L   Potassium 3.8 3.5 - 5.1 mmol/L   Chloride 106 98 - 111 mmol/L   CO2 24 22 - 32 mmol/L   Glucose, Bld 98 70 - 99 mg/dL   BUN 16 8 - 23 mg/dL   Creatinine, Ser 7.841.30 (H) 0.61 - 1.24 mg/dL   Calcium 8.1 (L) 8.9 - 10.3 mg/dL   GFR calc non Af Amer 55 (L) >60 mL/min   GFR calc Af Amer >60 >60 mL/min   Anion gap 8 5 - 15    Comment: Performed at [facility], [address] [place], [place] [address]   No results found.  Medical Problem List and Plan: 1.  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 Aspirin and Plavix therapy 3 weeks then aspirin alone 2.  DVT Prophylaxis/Anticoagulation: subcutaneous Lovenox. Monitor for any bleeding episodes 3. Pain Management:  Tramadol as needed 4. Mood: Ativan 0.5 mg every 8 hours as needed for anxiety 5. Neuropsych: This patient is capable of making decisions onhis own behalf. 6. Skin/Wound Care: Routine skin checks 7. Fluids/Electrolytes/Nutrition:  Routine in and out's with follow-up chemistries 8. Hypertension. Norvasc 10 mg daily. Monitor with increased mobility 9. Hyperlipidemia. Lipitor 10. History of tobacco use. Provide counseling    Post Admission Physician Evaluation: 1. Preadmission assessment reviewed and changes made below. 2. Functional deficits secondary  to right basal ganglia, corona radiata infarct. 3. Patient is admitted to receive  collaborative, interdisciplinary care between the physiatrist, rehab nursing staff, and therapy team. 4. Patient's level of medical complexity and substantial therapy needs in context of that medical necessity cannot be provided at a lesser intensity of care such as a SNF. 5. Patient has experienced substantial functional loss from his/her baseline which was documented above under the "Functional History" and "Functional Status" headings.  Judging by the patient's diagnosis, physical exam, and functional history, the patient has potential for functional progress which will result in measurable gains while on inpatient rehab.  These gains will be of substantial and practical use upon discharge  in facilitating mobility and self-care at the household level. 6. Physiatrist will provide 24 hour management of medical needs as well as oversight of the therapy plan/treatment and provide guidance as appropriate regarding the interaction of the two. 7. 24 hour rehab nursing will assist with safety, disease management, medication administration, pain management and patient education  and help integrate therapy concepts, techniques,education, etc. 8. PT will assess and treat for/with: Lower extremity strength, range of motion, stamina, balance, functional mobility, safety, adaptive techniques and equipment, coping skills, pain control, education. Goals are: Min A. 9. OT will assess and treat for/with: ADL's, functional mobility, safety, upper extremity strength, adaptive techniques and equipment, ego support, and community reintegration.   Goals are: Min A. Therapy may proceed with showering this patient. 10. Case Management and Social Worker will assess and treat for psychological issues and discharge planning. 11. Team conference will be held weekly to assess progress toward goals and to determine barriers to discharge. 12.  Patient will receive at least 3 hours of therapy per day at least 5 days per week. 13. ELOS:  19-24 days.       14. Prognosis:  good  I have personally performed a face to face diagnostic evaluation, including, but not limited to relevant history and physical exam findings, of this patient and developed relevant assessment and plan.  Additionally, I have reviewed and concur with the physician assistant's documentation above.  The patient's status has not changed. The original post admission physician evaluation remains appropriate, and any changes from the pre-admission screening or documentation from the acute chart are noted above.    Wayne Morrow, MD, ABPMR Wayne Rossetti Angiulli, PA-C 01/28/2018

## 2018-01-28 NOTE — Progress Notes (Signed)
Educated the pt about bladder elimination and that we need to remove the condom catheter and staff can assist him to use urinal/BSC. Pt refused to allow me to remove the condom catheter at this time.

## 2018-01-28 NOTE — Progress Notes (Signed)
Patient ID: Wayne HaberGary Roughton, male   DOB: 03-26-1945, 72 y.o.   MRN: [id number] Patient arrived on unit at 1504 from 3W. He was brought up in the bed with two pairs of glasses. Nursing called 3W to locate belongings and they stated the tech will bring belongings up. Admission information discussed with pt. Safety information reviewed including him agreeing to get up only with non-skid socks on and using call light appropriately.

## 2018-01-29 ENCOUNTER — Inpatient Hospital Stay (HOSPITAL_COMMUNITY): Payer: Medicare Other | Admitting: Speech Pathology

## 2018-01-29 ENCOUNTER — Inpatient Hospital Stay (HOSPITAL_COMMUNITY): Payer: Medicare Other | Admitting: Physical Therapy

## 2018-01-29 ENCOUNTER — Inpatient Hospital Stay (HOSPITAL_COMMUNITY): Payer: Medicare Other | Admitting: Occupational Therapy

## 2018-01-29 DIAGNOSIS — M1732 Unilateral post-traumatic osteoarthritis, left knee: Secondary | ICD-10-CM

## 2018-01-29 DIAGNOSIS — I739 Peripheral vascular disease, unspecified: Secondary | ICD-10-CM

## 2018-01-29 DIAGNOSIS — I69354 Hemiplegia and hemiparesis following cerebral infarction affecting left non-dominant side: Principal | ICD-10-CM

## 2018-01-29 LAB — CBC WITH DIFFERENTIAL/PLATELET
Abs Immature Granulocytes: 0.02 10*3/uL (ref 0.00–0.07)
BASOS ABS: 0.1 10*3/uL (ref 0.0–0.1)
Basophils Relative: 1 %
Eosinophils Absolute: 0.5 10*3/uL (ref 0.0–0.5)
Eosinophils Relative: 7 %
HCT: 37.8 % — ABNORMAL LOW (ref 39.0–52.0)
Hemoglobin: 12.6 g/dL — ABNORMAL LOW (ref 13.0–17.0)
Immature Granulocytes: 0 %
LYMPHS ABS: 1.6 10*3/uL (ref 0.7–4.0)
Lymphocytes Relative: 23 %
MCH: 27.6 pg (ref 26.0–34.0)
MCHC: 33.3 g/dL (ref 30.0–36.0)
MCV: 82.9 fL (ref 80.0–100.0)
Monocytes Absolute: 0.6 10*3/uL (ref 0.1–1.0)
Monocytes Relative: 8 %
Neutro Abs: 4.2 10*3/uL (ref 1.7–7.7)
Neutrophils Relative %: 61 %
Platelets: 140 10*3/uL — ABNORMAL LOW (ref 150–400)
RBC: 4.56 MIL/uL (ref 4.22–5.81)
RDW: 14.3 % (ref 11.5–15.5)
WBC: 6.9 10*3/uL (ref 4.0–10.5)
nRBC: 0 % (ref 0.0–0.2)

## 2018-01-29 LAB — COMPREHENSIVE METABOLIC PANEL
ALBUMIN: 3.1 g/dL — AB (ref 3.5–5.0)
ALT: 17 U/L (ref 0–44)
AST: 27 U/L (ref 15–41)
Alkaline Phosphatase: 39 U/L (ref 38–126)
Anion gap: 9 (ref 5–15)
BUN: 16 mg/dL (ref 8–23)
CO2: 26 mmol/L (ref 22–32)
CREATININE: 1.27 mg/dL — AB (ref 0.61–1.24)
Calcium: 8.6 mg/dL — ABNORMAL LOW (ref 8.9–10.3)
Chloride: 103 mmol/L (ref 98–111)
GFR calc Af Amer: >60 mL/min (ref >60)
GFR calc non Af Amer: 56 mL/min — ABNORMAL LOW (ref >60)
Glucose, Bld: 103 mg/dL — ABNORMAL HIGH (ref 70–99)
Potassium: 4.2 mmol/L (ref 3.5–5.1)
Sodium: 138 mmol/L (ref 135–145)
Total Bilirubin: 1.7 mg/dL — ABNORMAL HIGH (ref 0.3–1.2)
Total Protein: 6.3 g/dL — ABNORMAL LOW (ref 6.5–8.1)

## 2018-01-29 MED ORDER — DICLOFENAC SODIUM 1 % TD GEL
2.0000 g | Freq: Four times a day (QID) | TRANSDERMAL | Status: DC
  Administered 2018-01-29 – 2018-02-18 (×51): 2 g via TOPICAL
  Filled 2018-01-29 (×3): qty 100

## 2018-01-29 NOTE — Discharge Instructions (Signed)
Inpatient Rehab Discharge Instructions  Wayne HaberGary Griffin Discharge date and time: No discharge date for patient encounter.   Activities/Precautions/ Functional Status: Activity: activity as tolerated Diet: soft Wound Care: none needed Functional status:  ___ No restrictions     ___ Walk up steps independently ___ 24/7 supervision/assistance   ___ Walk up steps with assistance ___ Intermittent supervision/assistance  ___ Bathe/dress independently ___ Walk with walker     _x__ Bathe/dress with assistance ___ Walk Independently    ___ Shower independently ___ Walk with assistance    ___ Shower with assistance ___ No alcohol     ___ Return to work/school ________  Special Instructions: No driving  Aspirin and Plavix 3 weeks then aspirin alone STROKE/TIA DISCHARGE INSTRUCTIONS SMOKING Cigarette smoking nearly doubles your risk of having a stroke & is the single most alterable risk factor  If you smoke or have smoked in the last 12 months, you are advised to quit smoking for your health.  Most of the excess cardiovascular risk related to smoking disappears within a year of stopping.  Ask you doctor about anti-smoking medications  [place] Quit Line: 1-800-QUIT NOW  Free Smoking Cessation Classes [phone]  CHOLESTEROL Know your levels; limit fat & cholesterol in your diet  Lipid Panel     Component Value Date/Time   CHOL 172 01/25/2018 0402   TRIG 107 01/25/2018 0402   HDL 36 (L) 01/25/2018 0402   CHOLHDL 4.8 01/25/2018 0402   VLDL 21 01/25/2018 0402   LDLCALC 115 (H) 01/25/2018 0402      Many patients benefit from treatment even if their cholesterol is at goal.  Goal: Total Cholesterol (CHOL) less than 160  Goal:  Triglycerides (TRIG) less than 150  Goal:  HDL greater than 40  Goal:  LDL (LDLCALC) less than 100   BLOOD PRESSURE American Stroke Association blood pressure target is less that 120/80 mm/Hg  Your discharge blood pressure is:  BP: (!) 184/122  Monitor your  blood pressure  Limit your salt and alcohol intake  Many individuals will require more than one medication for high blood pressure  DIABETES (A1c is a blood sugar average for last 3 months) Goal HGBA1c is under 7% (HBGA1c is blood sugar average for last 3 months)  Diabetes: No known diagnosis of diabetes    Lab Results  Component Value Date   HGBA1C 5.3 01/25/2018     Your HGBA1c can be lowered with medications, healthy diet, and exercise.  Check your blood sugar as directed by your physician  Call your physician if you experience unexplained or low blood sugars.  PHYSICAL ACTIVITY/REHABILITATION Goal is 30 minutes at least 4 days per week  Activity: Increase activity slowly, Therapies: Physical Therapy: Home Health Return to work:   Activity decreases your risk of heart attack and stroke and makes your heart stronger.  It helps control your weight and blood pressure; helps you relax and can improve your mood.  Participate in a regular exercise program.  Talk with your doctor about the best form of exercise for you (dancing, walking, swimming, cycling).  DIET/WEIGHT Goal is to maintain a healthy weight  Your discharge diet is:  Diet Order            DIET DYS 3 Room service appropriate? Yes; Fluid consistency: Thin  Diet effective now              liquids Your height is:  Height: 5\' 11"  (180.3 cm) Your current weight is: Weight: 108.4 kg  Your Body Mass Index (BMI) is:  BMI (Calculated): 33.35  Following the type of diet specifically designed for you will help prevent another stroke.  Your goal weight range is:    Your goal Body Mass Index (BMI) is 19-24.  Healthy food habits can help reduce 3 risk factors for stroke:  High cholesterol, hypertension, and excess weight.  RESOURCES Stroke/Support Group:  Call [phone]   STROKE EDUCATION PROVIDED/REVIEWED AND GIVEN TO PATIENT Stroke warning signs and symptoms How to activate emergency medical system (call  [phone]). Medications prescribed at discharge. Need for follow-up after discharge. Personal risk factors for stroke. Pneumonia vaccine given:  Flu vaccine given:  My questions have been answered, the writing is legible, and I understand these instructions.  I will adhere to these goals & educational materials that have been provided to me after my discharge from the hospital.      My questions have been answered and I understand these instructions. I will adhere to these goals and the provided educational materials after my discharge from the hospital.  Patient/Caregiver Signature _______________________________ Date __________  Clinician Signature _______________________________________ Date __________  Please bring this form and your medication list with you to all your follow-up doctor's appointments.

## 2018-01-29 NOTE — Care Management Note (Signed)
Inpatient Rehabilitation Center Individual Statement of Services  Patient Name:  Wayne Griffin  Date:  01/29/2018  Welcome to the Inpatient Rehabilitation Center.  Our goal is to provide you with an individualized program based on your diagnosis and situation, designed to meet your specific needs.  With this comprehensive rehabilitation program, you will be expected to participate in at least 3 hours of rehabilitation therapies Monday-Friday, with modified therapy programming on the weekends.  Your rehabilitation program will include the following services:  Physical Therapy (PT), Occupational Therapy (OT), Speech Therapy (ST), 24 hour per day rehabilitation nursing, Therapeutic Recreaction (TR), Neuropsychology, Case Management (Social Worker), Rehabilitation Medicine, Nutrition Services and Pharmacy Services  Weekly team conferences will be held on Wednesday to discuss your progress.  Your Social Worker will talk with you frequently to get your input and to update you on team discussions.  Team conferences with you and your family in attendance may also be held.  Expected length of stay: 2-3 weeks  Overall anticipated outcome: min assist level  Depending on your progress and recovery, your program may change. Your Social Worker will coordinate services and will keep you informed of any changes. Your Social Worker's name and contact numbers are listed  below.  The following services may also be recommended but are not provided by the Inpatient Rehabilitation Center:   Home Health Rehabiltiation Services  Outpatient Rehabilitation Services    Arrangements will be made to provide these services after discharge if needed.  Arrangements include referral to agencies that provide these services.  Your insurance has been verified to be:  Medicare part A & B Your primary doctor is:  None  Pertinent information will be shared with your doctor and your insurance company.  Social Worker:  Dossie DerBecky  Jhanae Jaskow[place], SW [phone] or ([phone]) [phone]  Information discussed with and copy given to patient by: Lucy Chrisupree, Tineka Uriegas G, 01/29/2018, 9:59 AM

## 2018-01-29 NOTE — Evaluation (Signed)
Occupational Therapy Assessment and Plan  Patient Details  Name: Wayne Griffin MRN: [id number] Date of Birth: [birth year]  OT Diagnosis: hemiplegia affecting non-dominant side, muscle weakness (generalized) and pain in joint Rehab Potential: Rehab Potential (ACUTE ONLY): Good ELOS:  20-21 days   Today's Date: 01/29/2018 OT Individual Time: 0160-1093 OT Individual Time Calculation (min): 75 min     Problem List:  Patient Active Problem List   Diagnosis Date Noted  . Essential hypertension 01/28/2018  . Hyperlipidemia 01/28/2018  . Left leg pain 01/28/2018  . Obesity 01/28/2018  . 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 Small vessel disease ([place]) 01/28/2018  . Anxiety   . Cerebrovascular accident (CVA) ([place])   . History of tobacco use   . Dyslipidemia   . Hemiparesis affecting left side as late effect of stroke ([place])   . Acute CVA (cerebrovascular accident) [facility]) s/p IV tPA 01/24/2018    Past Medical History: History reviewed. No pertinent past medical history. Past Surgical History:  Past Surgical History:  Procedure Laterality Date  . APPENDECTOMY  1952    Assessment & Plan Clinical Impression: Wayne Griffin is a 72 year old right-handed male with history of hypertension and tobacco abuse on no prescription medications. Per chart review and patient, patient lives with son. Independent prior to admission using a cane. Son works during the day. One level home with 2-3 steps to entry. Presented 01/24/2018 with left facial droop and left-sided weakness with dysarthria as well as a fall. Cranial CT reviewed, unremarkable for acute intracranial process. Per report, there was some question of focal hyperdensity at the right MCA bifurcation. Patient did receive TPA. CT angiogram of head and neck negative perfusion study.No large vessel occlusion or stenosis. MRI showed acute/subacute nonhemorrhagic infarction involving the posterior right lentiform nucleus, posterior limb  right internal capsule and dorsal caudate. Remote hemorrhagic infarct of the right lentiform nucleus just anterior to the area of the acute subacute infarction. Echocardiogram with ejection fraction of 23% grade 1 diastolic dysfunction. Neurology follow-up placed on aspirin and Plavix therapy 3 weeks then aspirin alone.subcutaneous Lovenox for DVT prophylaxis. Tolerating a mechanical soft diet. Blood pressures monitored initially in need of Cleviprex since improved. Therapy evaluations completed with recommendations of physical medicine rehabilitation consult. Patient was admitted for a comprehensive rehabilitation program.  Patient transferred to CIR on 01/28/2018 .    Patient currently requires max with basic self-care skills secondary to muscle weakness, decreased cardiorespiratoy endurance, abnormal tone and decreased sitting balance, decreased standing balance, decreased postural control, hemiplegia and decreased balance strategies.  Prior to hospitalization, patient could complete was I with ADLs.  Patient will benefit from skilled intervention to increase independence with basic self-care skills prior to discharge home with care partner.  Anticipate patient will require minimal physical assistance and follow up home health.  OT - End of Session Activity Tolerance: Tolerates 10 - 20 min activity with multiple rests Endurance Deficit: Yes OT Assessment Rehab Potential (ACUTE ONLY): Good OT Barriers to Discharge: Decreased caregiver support OT Barriers to Discharge Comments: Son works during the day OT Patient demonstrates impairments in the following area(s): Balance;Endurance;Motor;Pain OT Basic ADL's Functional Problem(s): Bathing;Dressing;Toileting;Grooming OT Transfers Functional Problem(s): Toilet;Tub/Shower OT Additional Impairment(s): Fuctional Use of Upper Extremity OT Plan OT Intensity: Minimum of 1-2 x/day, 45 to 90 minutes OT Frequency: 5 out of 7 days OT Duration/Estimated  Length of Stay:  20-21 days OT Treatment/Interventions: Balance/vestibular training;Discharge planning;DME/adaptive equipment instruction;Functional mobility training;Functional electrical stimulation;Neuromuscular re-education;Patient/family education;Self Care/advanced ADL retraining;Therapeutic Exercise;UE/LE Coordination  activities;UE/LE Strength taining/ROM;Therapeutic Activities;Psychosocial support OT Self Feeding Anticipated Outcome(s): independent OT Basic Self-Care Anticipated Outcome(s): min A OT Toileting Anticipated Outcome(s): min A OT Bathroom Transfers Anticipated Outcome(s): S to toilet, min A to shower OT Recommendation Patient destination: Home Follow Up Recommendations: Home health OT Equipment Recommended: Tub/shower seat;3 in 1 bedside comode   Skilled Therapeutic Intervention Pt seen for initial evaluation and ADL training. Explained role of OT, discussed pt's goals and ELOS.  Pt worked on LB self care from bed level with max A.  Sat to EOB max A and then with +2 A transferred bed to w/c via Denna Haggard lift.   UB self care at sink and then pt stated "I am peeing".  +2 A to have pt stand in [place] for brief changed.  Pt obtained a small skin tear on L arm and RN treated.  Pt positioned in w/c with L arm tray for support. Pt in chair with belt alarm on and all needs met.    OT Evaluation Precautions/Restrictions  Precautions Precautions: Fall Precaution Comments: L hemi Restrictions Weight Bearing Restrictions: No  Pain Pain Assessment Pain Score: 7   - L knee pain, RN aware  Home Living/Prior Functioning Home Living Family/patient expects to be discharged to:: Private residence Living Arrangements: Children, Other relatives Available Help at Discharge: Family Type of Home: House Home Access: Stairs to enter Technical brewer of Steps: 2-3 Entrance Stairs-Rails: None Home Layout: One level Bathroom Shower/Tub: Multimedia programmer: Standard   Lives With: Son Prior Function Level of Independence: Independent with basic ADLs, Independent with gait, Independent with transfers, Requires assistive device for independence  Able to Take Stairs?: Yes Driving: Yes Vocation: Retired Comments: overall independent though had recently begun using SPC for ambulation ADL ADL Eating: Set up Where Assessed-Eating: Bed level Grooming: Minimal assistance Where Assessed-Grooming: Sitting at sink Upper Body Bathing: Minimal assistance Where Assessed-Upper Body Bathing: Sitting at sink Lower Body Bathing: Maximal assistance Where Assessed-Lower Body Bathing: Bed level Upper Body Dressing: Maximal assistance Where Assessed-Upper Body Dressing: Wheelchair Lower Body Dressing: Dependent Where Assessed-Lower Body Dressing: Bed level Toileting: Not assessed Toilet Transfer: Not assessed Gaffer Transfer: Not assessed Vision Baseline Vision/History: Wears glasses Wears Glasses: At all times Patient Visual Report: No change from baseline Eye Alignment: Within Functional Limits Ocular Range of Motion: Within Functional Limits Alignment/Gaze Preference: Within Defined Limits Tracking/Visual Pursuits: Able to track stimulus in all quads without difficulty Perception  Perception: Within Functional Limits Praxis Praxis: Intact Cognition Overall Cognitive Status: Within Functional Limits for tasks assessed Arousal/Alertness: Awake/alert Orientation Level: Person;Place;Situation Person: Oriented Place: Oriented Situation: Oriented Year: 2019 Month: December Day of Week: Correct Memory: Appears intact Immediate Memory Recall: Sock;Blue;Bed Memory Recall: Sock;Blue;Bed Memory Recall Sock: Without Cue Memory Recall Blue: Without Cue Memory Recall Bed: Without Cue Awareness: Appears intact Problem Solving: Appears intact Safety/Judgment: Appears intact Sensation Sensation Light Touch: Appears Intact Hot/Cold: Not  tested Proprioception: Not tested Stereognosis: Impaired by gross assessment Coordination Gross Motor Movements are Fluid and Coordinated: No Fine Motor Movements are Fluid and Coordinated: No Coordination and Movement Description: dense L hemiplegic with some increased tone in shoulder and elbow Motor  Motor Motor: Hemiplegia;Abnormal tone Mobility    max A bed mobility,  +2 with stedy lift for transfers Trunk/Postural Assessment  Cervical Assessment Cervical Assessment: Within Functional Limits Thoracic Assessment Thoracic Assessment: Exceptions to WFL(kyphotic posture) Lumbar Assessment Lumbar Assessment: Exceptions to WFL(posterior tilt) Postural Control Postural Control: Deficits on evaluation(L lean when in STEDY  lift, able to hold static sit posture at EOB)  Balance Static Sitting Balance Static Sitting - Level of Assistance: 5: Stand by assistance Dynamic Sitting Balance Dynamic Sitting - Level of Assistance: 3: Mod assist Static Standing Balance Static Standing - Level of Assistance: 1: +1 Total assist Extremity/Trunk Assessment RUE Assessment RUE Assessment: Within Functional Limits LUE Assessment LUE Assessment: Exceptions to [facility] Passive Range of Motion (PROM) Comments: WFL, except for tightness in L shoulder Active Range of Motion (AROM) Comments: no active movement  LUE Body System: Neuro Brunstrum levels for arm and hand: Arm Brunstrum level for arm: Stage I Presynergy     Refer to Care Plan for Long Term Goals  Recommendations for other services: None    Discharge Criteria: Patient will be discharged from OT if patient refuses treatment 3 consecutive times without medical reason, if treatment goals not met, if there is a change in medical status, if patient makes no progress towards goals or if patient is discharged from hospital.  The above assessment, treatment plan, treatment alternatives and goals were discussed and mutually agreed upon: by  patient  [facility] 01/29/2018, 12:32 PM

## 2018-01-29 NOTE — Progress Notes (Signed)
Physical Therapy Assessment and Plan  Patient Details  Name: Vallie Teters MRN: [id number] Date of Birth: May 28, 1945  PT Diagnosis: Abnormal posture, Coordination disorder, Difficulty walking, Hemiplegia non-dominant, Muscle weakness and Pain in L knee Rehab Potential: Good ELOS: 21-24 days   Today's Date: 01/29/2018 PT Individual Time: 9562-1308 PT Individual Time Calculation (min): 45 min  and Today's Date: 01/29/2018 PT Missed Time: 15 Minutes Missed Time Reason: Patient fatigue   Problem List:  Patient Active Problem List   Diagnosis Date Noted  . Essential hypertension 01/28/2018  . Hyperlipidemia 01/28/2018  . Left leg pain 01/28/2018  . Obesity 01/28/2018  . 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 Small vessel disease ([place]) 01/28/2018  . Anxiety   . Cerebrovascular accident (CVA) ([place])   . History of tobacco use   . Dyslipidemia   . Hemiparesis affecting left side as late effect of stroke ([place])   . Acute CVA (cerebrovascular accident) [facility]) s/p IV tPA 01/24/2018    Past Medical History: History reviewed. No pertinent past medical history. Past Surgical History:  Past Surgical History:  Procedure Laterality Date  . APPENDECTOMY  1952    Assessment & Plan Clinical Impression: Patient is a 72 year old right-handed male with history of hypertension and tobacco abuse on no prescription medications. Per chart review and patient, patient lives with son. Independent prior to admission using a cane. Son works during the day. One level home with 2-3 steps to entry. Presented 01/24/2018 with left facial droop and left-sided weakness with dysarthria as well as a fall. Cranial CT reviewed, unremarkable for acute intracranial process.  Per report, there was some question of focal hyperdensity at the right MCA bifurcation. Patient did receive TPA. CT angiogram of head and neck negative perfusion study.No large vessel occlusion or stenosis. MRI showed acute/subacute  nonhemorrhagic infarction involving the posterior right lentiform nucleus, posterior limb right internal capsule and dorsal caudate. Remote hemorrhagic infarct of the right lentiform nucleus just anterior to the area of the acute subacute infarction. Echocardiogram with ejection fraction of 65% grade 1 diastolic dysfunction. Neurology follow-up placed on aspirin and Plavix therapy 3 weeks then aspirin alone.subcutaneous Lovenox for DVT prophylaxis. Tolerating a mechanical soft diet. Blood pressures monitored initially in need of Cleviprex since improved. Therapy evaluations completed with recommendations of physical medicine rehabilitation consult. Patient transferred to CIR on 01/28/2018 .   Patient currently requires total with mobility secondary to muscle weakness, decreased cardiorespiratoy endurance, impaired timing and sequencing, abnormal tone, unbalanced muscle activation, decreased coordination and decreased motor planning and decreased sitting balance, decreased standing balance, decreased postural control, hemiplegia and decreased balance strategies.  Prior to hospitalization, patient was modified independent  with mobility and lived with Son in a House home.  Home access is 2-3Stairs to enter.  Patient will benefit from skilled PT intervention to maximize safe functional mobility, minimize fall risk and decrease caregiver burden for planned discharge home with 24 hour assist.  Anticipate patient will benefit from follow up [facility] at discharge.  PT - End of Session Activity Tolerance: Tolerates < 10 min activity, no significant change in vital signs Endurance Deficit: Yes Endurance Deficit Description: moderate increase in work of breathing w/ all activity, including static upright sitting PT Assessment Rehab Potential (ACUTE/IP ONLY): Good PT Barriers to Discharge: [place] home environment;Decreased caregiver support;Home environment access/layout PT Barriers to Discharge Comments: steps to  enter home and son works during the day PT Patient demonstrates impairments in the following area(s): Balance;Behavior;Edema;Endurance;Motor;Pain;Safety;Sensory PT Transfers Functional Problem(s):  Bed to Chair;Bed Mobility;Car;Furniture;Floor PT Locomotion Functional Problem(s): Ambulation;Wheelchair Mobility;Stairs PT Plan PT Intensity: Minimum of 1-2 x/day ,45 to 90 minutes PT Frequency: 5 out of 7 days PT Duration Estimated Length of Stay: 21-24 days PT Treatment/Interventions: Ambulation/gait training;Discharge planning;Functional mobility training;Psychosocial support;Therapeutic Activities;Visual/perceptual remediation/compensation;Wheelchair propulsion/positioning;Therapeutic Exercise;Skin care/wound management;Neuromuscular re-education;Disease management/prevention;Balance/vestibular training;Cognitive remediation/compensation;DME/adaptive equipment instruction;Pain management;Splinting/orthotics;UE/LE Strength taining/ROM;Community reintegration;Functional electrical stimulation;Patient/family education;Stair training;UE/LE Coordination activities PT Transfers Anticipated Outcome(s): min assist PT Locomotion Anticipated Outcome(s): min assist short distance gait, supervision w/c mobility  PT Recommendation Follow Up Recommendations: Home health PT Patient destination: Home(SNF if family unable to arrange for 24/7 assist) Equipment Recommended: To be determined  Skilled Therapeutic Intervention  Pt in supine and agreeable to therapy, pain as detailed below. Performed functional mobility as detailed below including bed mobility, transfers, and w/c mobility. Attempted to stand +1 assist to stedy, ultimately unable and needed +2 to stand from elevated bed and w/c. Attempted gait at rail, however required max-total assist to maintain standing, deferred at this time 2/2 safety. Instructed on w/c mobility via R hemi technique w/ verbal and visual cues. Pt limited by fatigue and increased work of  breathing w/ all activity this session. Pt just given ativan medication prior to session, increased sleepiness as session progressed. Returned to room and ended session in supine, all needs met. Instructed pt in results of PT evaluation as detailed below, PT POC, rehab potential, rehab goals, and discharge recommendations. Additionally discussed CIR's policies regarding fall safety and use of chair alarm and/or quick release belt. Pt verbalized understanding and in agreement. Pt aware we are recommending 24/7 assist at d/c, will update son as he becomes available.   PT Evaluation Precautions/Restrictions Precautions Precautions: Fall Precaution Comments: L hemi Restrictions Weight Bearing Restrictions: No General PT Amount of Missed Time (min): 15 Minutes PT Missed Treatment Reason: Patient fatigue Vital SignsTherapy Vitals Temp: 98.7 F (37.1 C) Temp Source: Oral Pulse Rate: 72 Resp: 18 BP: (!) 182/87(nurse notified) Patient Position (if appropriate): Lying Oxygen Therapy SpO2: 100 % O2 Device: Room Air Pain Pain Assessment Pain Scale: 0-10 Pain Score: 3  Pain Type: Acute pain Pain Location: Knee Pain Orientation: Left Pain Descriptors / Indicators: Aching Pain Onset: On-going Home Living/Prior Functioning Home Living Available Help at Discharge: Family Type of Home: House Home Access: Stairs to enter Technical brewer of Steps: 2-3 Entrance Stairs-Rails: None Home Layout: One level Bathroom Shower/Tub: Walk-in Radio producer: Standard  Lives With: Son Prior Function Level of Independence: Independent with basic ADLs;Independent with gait;Independent with transfers;Requires assistive device for independence;Independent with homemaking with ambulation(intermittent use of SPC)  Able to Take Stairs?: Yes Driving: Yes(states he could drive, just didn't for the last year or so) Vocation: Retired Comments: overall independent though had recently begun using SPC  for ambulation Vision/Perception  Vision - Assessment Eye Alignment: Within Functional Limits Ocular Range of Motion: Within Functional Limits Alignment/Gaze Preference: Within Defined Limits Tracking/Visual Pursuits: Able to track stimulus in all quads without difficulty Perception Perception: Within Functional Limits Praxis Praxis: Intact  Cognition Overall Cognitive Status: Within Functional Limits for tasks assessed Arousal/Alertness: Awake/alert Orientation Level: Oriented X4 Memory: Appears intact Awareness: Appears intact Problem Solving: Appears intact Safety/Judgment: Appears intact Sensation Sensation Light Touch: Appears Intact Hot/Cold: Not tested Proprioception: Appears Intact Stereognosis: Impaired by gross assessment Coordination Gross Motor Movements are Fluid and Coordinated: No Fine Motor Movements are Fluid and Coordinated: No Coordination and Movement Description: dense L hemi  Heel Shin Test: LLE unable, RLE WNL Motor  Motor  Motor: Hemiplegia;Abnormal tone Motor - Skilled Clinical Observations: Dense L hemi; increased hamstring and quad tone, 2-3 beats clonus   Mobility Bed Mobility Bed Mobility: Rolling Right;Rolling Left;Supine to Sit;Sit to Supine Rolling Right: Maximal Assistance - Patient 25-49% Rolling Left: Minimal Assistance - Patient > 75% Supine to Sit: Maximal Assistance - Patient - Patient 25-49% Sit to Supine: Maximal Assistance - Patient 25-49% Transfers Transfers: Stand to Sit;Sit to [employer] Sit to Stand: 2 Helpers Stand to Sit: 2 Helpers Stand Pivot Transfers: Dependent - mechanical lift Transfer (Assistive device): Other (Comment)(stedy) Locomotion  Gait Ambulation: No Gait Gait: No Stairs / Additional Locomotion Stairs: No Wheelchair Mobility Wheelchair Mobility: Yes Wheelchair Assistance: Chartered loss adjuster: Right lower extremity;Right upper extremity Wheelchair Parts  Management: Supervision/cueing Distance: 50'  Trunk/Postural Assessment  Cervical Assessment Cervical Assessment: Within Functional Limits Thoracic Assessment Thoracic Assessment: Exceptions to WFL(kyphotic ) Lumbar Assessment Lumbar Assessment: Exceptions to WFL(posterior pelvic tilt) Postural Control Postural Control: Deficits on evaluation(delayed/insufficient)  Balance Balance Balance Assessed: Yes Static Sitting Balance Static Sitting - Level of Assistance: 5: Stand by assistance Dynamic Sitting Balance Dynamic Sitting - Level of Assistance: 4: Min assist Static Standing Balance Static Standing - Level of Assistance: 1: +1 Total assist Extremity Assessment  RUE Assessment RUE Assessment: Within Functional Limits LUE Assessment LUE Assessment: Exceptions to [facility] Passive Range of Motion (PROM) Comments: WFL, except for tightness in L shoulder Active Range of Motion (AROM) Comments: no active movement  LUE Body System: Neuro Brunstrum levels for arm and hand: Arm Brunstrum level for arm: Stage I Presynergy RLE Assessment RLE Assessment: Within Functional Limits LLE Assessment LLE Assessment: Exceptions to [facility] Passive Range of Motion (PROM) Comments: WFL, increased time to reach full range 2/2 tone LLE Strength LLE Overall Strength: Deficits Left Hip Flexion: 1/5 Left Hip Extension: 2-/5 Left Hip ABduction: 1/5 Left Hip ADduction: 1/5 Left Knee Flexion: 2-/5 Left Knee Extension: 2-/5 Left Ankle Dorsiflexion: 0/5 Left Ankle Plantar Flexion: 1/5 Left Ankle Inversion: 0/5 Left Ankle Eversion: 0/5    Refer to Care Plan for Long Term Goals  Recommendations for other services: Neuropsych  Discharge Criteria: Patient will be discharged from PT if patient refuses treatment 3 consecutive times without medical reason, if treatment goals not met, if there is a change in medical status, if patient makes no progress towards goals or if patient is discharged from  hospital.  The above assessment, treatment plan, treatment alternatives and goals were discussed and mutually agreed upon: by patient  Tiasha Helvie K Shanai Lartigue 01/29/2018, 2:40 PM

## 2018-01-29 NOTE — Evaluation (Signed)
Speech Language Pathology Assessment and Plan  Patient Details  Name: Wayne Griffin MRN: [id number] Date of Birth: 05/30/1945  SLP Diagnosis: Dysarthria;Dysphagia  Rehab Potential: Excellent ELOS: 21-24 days     Today's Date: 01/29/2018 SLP Individual Time: 7106-2694 SLP Individual Time Calculation (min): 55 min   Problem List:  Patient Active Problem List   Diagnosis Date Noted  . Essential hypertension 01/28/2018  . Hyperlipidemia 01/28/2018  . Left leg pain 01/28/2018  . Obesity 01/28/2018  . 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 Small vessel disease ([place]) 01/28/2018  . Anxiety   . Cerebrovascular accident (CVA) ([place])   . History of tobacco use   . Dyslipidemia   . Hemiparesis affecting left side as late effect of stroke ([place])   . Acute CVA (cerebrovascular accident) [facility]) s/p IV tPA 01/24/2018   Past Medical History: History reviewed. No pertinent past medical history. Past Surgical History:  Past Surgical History:  Procedure Laterality Date  . APPENDECTOMY  1952    Assessment / Plan / Recommendation Clinical Impression Patient is a 72 year old right-handed male with history of hypertension and tobacco abuse on no prescription medications. Per chart review and patient, patient lives with son. Independent prior to admission using a cane. Son works during the day. One level home with 2-3 steps to entry. Presented 01/24/2018 with left facial droop and left-sided weakness with dysarthria as well as a fall. Cranial CT reviewed, unremarkable for acute intracranial process.  Per report, there was some question of focal hyperdensity at the right MCA bifurcation. Patient did receive TPA. CT angiogram of head and neck negative perfusion study.No large vessel occlusion or stenosis. MRI showed acute/subacute nonhemorrhagic infarction involving the posterior right lentiform nucleus, posterior limb right internal capsule and dorsal caudate. Remote hemorrhagic infarct of the  right lentiform nucleus just anterior to the area of the acute subacute infarction. Echocardiogram with ejection fraction of 85% grade 1 diastolic dysfunction. Neurology follow-up placed on aspirin and Plavix therapy 3 weeks then aspirin alone.subcutaneous Lovenox for DVT prophylaxis. Tolerating a mechanical soft diet. Blood pressures monitored initially in need of Cleviprex since improved. Therapy evaluations completed with recommendations of physical medicine rehabilitation consult. Patient was admitted for a comprehensive rehabilitation program 01/28/18.   Patient's overall cognitive-linguistic function appears [facility] for all basic tasks assessed. Patient was not taking any medications prior to hospitalization and required cues for recall of function of medications during evaluation, therefore, recommend treatment focus on higher-level problem solving and recall with novel tasks like medication management. Patient also demonstrated mild dysarthria characterized by imprecise consonants impacting intelligibility at the sentence level. Patient consumed thin liquids via cup without overt s/s of aspiration but consumed solid textures with complete oral clearance but utilized very cautious mastication and extremely small bites. Patient reported this is baseline and that he is "always cautious." Patient requested to stay on soft textures at this time.  Patient would benefit from skilled SLP intervention to maximize his cognitive functioning and speech intelligibility prior to discharge.    Skilled Therapeutic Interventions          Patient administered a cognitive-linguistic evaluation and BSE. Please see above for details.    SLP Assessment  Patient will need skilled Speech Lanaguage Pathology Services during CIR admission    Recommendations  SLP Diet Recommendations: Dysphagia 3 (Mech soft);Thin Liquid Administration via: Cup;Straw Medication Administration: Whole meds with liquid Supervision: Patient able  to self feed Compensations: Slow rate;Small sips/bites;Lingual sweep for clearance of pocketing;Follow solids with liquid  Postural Changes and/or Swallow Maneuvers: Seated upright 90 degrees Oral Care Recommendations: Oral care BID Recommendations for Other Services: Neuropsych consult Patient destination: Home Follow up Recommendations: (TBD) Equipment Recommended: None recommended by SLP    SLP Frequency 3 to 5 out of 7 days   SLP Duration  SLP Intensity  SLP Treatment/Interventions 21-24 days   Minumum of 1-2 x/day, 30 to 90 minutes  Cognitive remediation/compensation;Dysphagia/aspiration precaution training;Internal/external aids;Speech/Language facilitation;Therapeutic Activities;Environmental controls;Cueing hierarchy;Functional tasks;Patient/family education    Pain Pain Assessment Pain Scale: 0-10 Pain Score: 3  Pain Type: Acute pain Pain Location: Knee Pain Orientation: Left Pain Descriptors / Indicators: Aching Pain Onset: On-going  Prior Functioning Type of Home: House  Lives With: Son Available Help at Discharge: Family Vocation: Retired  Industrial/product designer Term Goals: Week 1: SLP Short Term Goal 1 (Week 1): Patient will demonstrate problem solving for complex tasks with Mod I.  SLP Short Term Goal 2 (Week 1): Patient will utilize speech intelligibility strategies at the conversation level with supervision verbal cues to achieve 90% intelligibility.  SLP Short Term Goal 3 (Week 1): Patient will consume current diet without overt s/s of aspiration with Mod I.   Refer to Care Plan for Long Term Goals  Recommendations for other services: None   Discharge Criteria: Patient will be discharged from SLP if patient refuses treatment 3 consecutive times without medical reason, if treatment goals not met, if there is a change in medical status, if patient makes no progress towards goals or if patient is discharged from hospital.  The above assessment, treatment plan, treatment  alternatives and goals were discussed and mutually agreed upon: by patient  Danelle Curiale 01/29/2018, 3:28 PM

## 2018-01-29 NOTE — Plan of Care (Signed)
  Problem: Consults Goal: RH STROKE PATIENT EDUCATION Description See Patient Education module for education specifics  Outcome: Progressing   Problem: RH BOWEL ELIMINATION Goal: RH STG MANAGE BOWEL WITH ASSISTANCE Description STG Manage Bowel with mod Assistance.  Outcome: Progressing Goal: RH STG MANAGE BOWEL W/MEDICATION W/ASSISTANCE Description STG Manage Bowel with Medication with mod Assistance.  Outcome: Progressing   Problem: RH SKIN INTEGRITY Goal: RH STG SKIN FREE OF INFECTION/BREAKDOWN Description Skin to remain free from infection and breakdown while on rehab with min assist.  Outcome: Progressing Goal: RH STG MAINTAIN SKIN INTEGRITY WITH ASSISTANCE Description STG Maintain Skin Integrity With min Assistance.  Outcome: Progressing   Problem: RH SAFETY Goal: RH STG ADHERE TO SAFETY PRECAUTIONS W/ASSISTANCE/DEVICE Description STG Adhere to Safety Precautions With mod Assistance and use of appropriate assistive Device.  Outcome: Progressing   Problem: RH PAIN MANAGEMENT Goal: RH STG PAIN MANAGED AT OR BELOW PT'S PAIN GOAL Description <4 on a 1-10 pain scale  Outcome: Progressing   Problem: RH KNOWLEDGE DEFICIT Goal: RH STG INCREASE KNOWLEDGE OF HYPERTENSION Description Patient and caregiver will demonstrate knowledge of HTN medications, dietary restrictions, and BP parameters as set by the MD with min assist from rehab staff.  Outcome: Progressing Goal: RH STG INCREASE KNOWLEGDE OF HYPERLIPIDEMIA Description Patient and caregiver will demonstrate knowledge of HLD medications, and LDL, HDL, and triglycerides with min assist from rehab staff.  Outcome: Progressing Goal: RH STG INCREASE KNOWLEDGE OF STROKE PROPHYLAXIS Description Patient and caregiver will demonstrate knowledge of ASA and Plavix as used for stroke prophylaxis with min assist from rehab staff.   Outcome: Progressing   

## 2018-01-29 NOTE — Progress Notes (Addendum)
[place] PHYSICAL MEDICINE & REHABILITATION PROGRESS NOTE   Subjective/Complaints:  Pt with chronic knee pain, hx of knee surgery who fell when he had a stroke PTA Xray performed showing moderate tricompartmental OA, with hyperdensity (likely blood) in supra patellar bursa, , pain is only partially relieved by tramadol.    ROS- neg breathing issues, + constipation, no SOB Objective:   No results found. Recent Labs    01/27/18 0531 01/29/18 0634  WBC 6.5 6.9  HGB 11.1* 12.6*  HCT 34.5* 37.8*  PLT 101* 140*   Recent Labs    01/27/18 0531 01/29/18 0634  NA 138 138  K 3.8 4.2  CL 106 103  CO2 24 26  GLUCOSE 98 103*  BUN 16 16  CREATININE 1.30* 1.27*  CALCIUM 8.1* 8.6*    Intake/Output Summary (Last 24 hours) at 01/29/2018 0837 Last data filed at 01/28/2018 1805 Gross per 24 hour  Intake 240 ml  Output 400 ml  Net -160 ml     Physical Exam: Vital Signs Blood pressure (!) 180/84, pulse 76, temperature 98.6 F (37 C), temperature source Oral, resp. rate 19, height 5\' 11"  (1.803 m), weight 108.4 kg, SpO2 100 %.   General: No acute distress Mood and affect are appropriate Heart: Regular rate and rhythm no rubs murmurs or extra sounds Lungs: Clear to auscultation, breathing unlabored, no rales or wheezes Abdomen: Positive bowel sounds, soft nontender to palpation, nondistended Extremities: No clubbing, cyanosis, or edema Skin: No evidence of breakdown, no evidence of rash Neurologic: Cranial nerves II through XII intact, motor strength is 5/5 in R and 0/5 left deltoid, bicep, tricep, grip, hip flexor, knee extensors, ankle dorsiflexor and plantar flexor Sensory exam normal sensation to light touch and proprioception in bilateral upper and lower extremities Cerebellar exam normal finger to nose to finger as well as heel to shin in bilateral upper and lower extremities Musculoskeletal: Full range of motion in all 4 extremities. No joint  swelling   Assessment/Plan: 1. Functional deficits secondary to Left hemiplegia Right subcortical infarct which require 3+ hours per day of interdisciplinary therapy in a comprehensive inpatient rehab setting.  Physiatrist is providing close team supervision and 24 hour management of active medical problems listed below.  Physiatrist and rehab team continue to assess barriers to discharge/monitor patient progress toward functional and medical goals  Care Tool:  Bathing              Bathing assist       Upper Body Dressing/Undressing Upper body dressing        Upper body assist      Lower Body Dressing/Undressing Lower body dressing            Lower body assist       Toileting Toileting    Toileting assist       Transfers Chair/bed transfer  Transfers assist           Locomotion Ambulation   Ambulation assist              Walk 10 feet activity   Assist           Walk 50 feet activity   Assist           Walk 150 feet activity   Assist           Walk 10 feet on uneven surface  activity   Assist           Wheelchair     Assist  Wheelchair 50 feet with 2 turns activity    Assist            Wheelchair 150 feet activity     Assist          Medical Problem List and Plan: 1.  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 Aspirin and Plavix therapy 3 weeks then aspirin alone Team conf in am 2.  DVT Prophylaxis/Anticoagulation: subcutaneous Lovenox. Monitor for any bleeding episodes 3. Pain Management:  Tramadol as needed, add voltaren gel and Knee orthosis 4. Mood: Ativan 0.5 mg every 8 hours as needed for anxiety 5. Neuropsych: This patient is capable of making decisions onhis own behalf. 6. Skin/Wound Care: Routine skin checks 7. Fluids/Electrolytes/Nutrition:  Routine in and out's BMET  with mildly elevated creat but normal BUN 8. Hypertension. Norvasc 10 mg daily. Monitor with increased mobility 9. Hyperlipidemia. Lipitor 10. History of tobacco use. Provide counseling  11.  Borderline normocytic anemia, improved vs prior CBC, monitor on ASA, Plavix and Lovenox  LOS: 1 days A FACE TO FACE EVALUATION WAS PERFORMED  Erick Colace 01/29/2018, 8:37 AM

## 2018-01-29 NOTE — Progress Notes (Signed)
Inpatient Rehabilitation  Patient information reviewed and entered into eRehab system by Anastasia Tompson M. Juanitta Earnhardt, M.A., CCC/SLP, PPS Coordinator.  Information including medical coding, functional ability and quality indicators will be reviewed and updated through discharge.    Per nursing patient was given "Data Collection Information Summary" for Patients in Inpatient Rehabilitation Facilities with attached "Privacy Act Statement-Health Care Records" upon admission.   

## 2018-01-29 NOTE — Progress Notes (Signed)
Social Work  Social Work Assessment and Plan  Patient Details  Name: Wayne Griffin MRN: [id number] Date of Birth: [birth year]  Today's Date: 01/29/2018  Problem List:  Patient Active Problem List   Diagnosis Date Noted  . Essential hypertension 01/28/2018  . Hyperlipidemia 01/28/2018  . Left leg pain 01/28/2018  . Obesity 01/28/2018  . 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 Small vessel disease ([facility]) 01/28/2018  . Anxiety   . Cerebrovascular accident (CVA) ([facility])   . History of tobacco use   . Dyslipidemia   . Hemiparesis affecting left side as late effect of stroke ([facility])   . Acute CVA (cerebrovascular accident) [facility]) s/p IV tPA 01/24/2018   Past Medical History: History reviewed. No pertinent past medical history. Past Surgical History:  Past Surgical History:  Procedure Laterality Date  . APPENDECTOMY  1952   Social History:  reports that he has quit smoking. He has never used smokeless tobacco. He reports that he drank alcohol. He reports that he does not use drugs.  Family / Support Systems Marital Status: Single Patient Roles: Parent Children: Clay [phone] Other Supports: grandson lives upstairs Anticipated Caregiver: Son and grandson Ability/Limitations of Caregiver: Both work first shift Caregiver Availability: Evenings only Family Dynamics: Close knit family with son and grandson who are involved, both work and are not able to take off. Pt would need to get to mod/i level to be safe home alone from here. He does have a few friends who will come by and visit.  Social History Preferred language: English Religion:  Cultural Background: No issues Education: High School Read: Yes Write: Yes Employment Status: Retired Marine scientist Issues: No issues Guardian/Conservator: None-according to MD pt is capable of making his own decisions while here, will keep son informed also per pt's preference   Abuse/Neglect Abuse/Neglect  Assessment Can Be Completed: Yes Physical Abuse: Denies Verbal Abuse: Denies Sexual Abuse: Denies Exploitation of patient/patient's resources: Denies Self-Neglect: Denies  Emotional Status Pt's affect, behavior and adjustment status: Pt wants to regain his independence before he leaves here, he is somewhat out of it due to not sleeping last night. He reports it was lud up on the unit. He will participate in therapies but would like to get back in bed to sleep some before. He has always been indedpendent and taken care of himself. He was using a cane prior to admission due to knee issues. Recent Psychosocial Issues: was not seeing a MD not sure what his other health issues are Psychiatric History: Reports some anxiety doesn't take any medicines for this, may benefit from seeing neuro-psych while here and see if recommends some medicaiton for his anxiety. Will make referral to see neuro-psych. Substance Abuse History: No issues-quit tobacco  Patient / Family Perceptions, Expectations & Goals Pt/Family understanding of illness & functional limitations: Pt and son can explain his stroke and deficits, both hope he does well here and improves. Pt did talk with the MD and feels he has a good understanding of his stroke and issues surrounding this. It is his first day on rehab and he hopes it goes well. Premorbid pt/family roles/activities: Father, grandfather, retiree, friend, etc Anticipated changes in roles/activities/participation: resume Pt/family expectations/goals: Pt states: " I need to do well and get back on my feet, my son and grandson work."  Son states: " We will help him but we have to work."  [employer]: None Premorbid Home Care/DME Agencies: Other (Comment)(used a cane prior to admission  due to decline with his knee) Transportation available at discharge: Family Resource referrals recommended: Neuropsychology, Support group (specify)  Discharge  Planning Living Arrangements: Children, Other relatives Support Systems: Children, Other relatives, Friends/neighbors Type of Residence: Private residence Insurance Resources: Electrical engineerMedicare Financial Resources: Social Security, Family Support Financial Screen Referred: No Living Expenses: Own Money Management: Patient, Family Does the patient have any problems obtaining your medications?: Yes (Describe)(Doesn't see a MD therefor no meds prior to admission) Home Management: Son and pt Patient/Family Preliminary Plans: Hopes to retrun home with son who is there in the evenings and can assist, if he needs assist during the day he has no caregiver. Will await therapy goals and work on the best plan for pt at discharge. Pt is hard headed and will work hard to get back home. Sw Barriers to Discharge: Decreased caregiver support Sw Barriers to Discharge Comments: Does not have 24 hr care at DC family works Social Work Anticipated Follow Up Needs: HH/OP, Support Group  Clinical Impression Pleasant gentleman who has been managing at home but in the last month has declined and has had to use a cane. He did not seek out a MD until now and will need a PCP at discharge to follow him. His son and grandson are involved but both work and can not provide 24 hr care, will see how pt progresses and work on a safe plan for him for discharge. Have made referral for neuro-psych to see for his anxiety.  Lucy Chrisupree, Roseanna Koplin G 01/29/2018, 10:17 AM

## 2018-01-30 ENCOUNTER — Inpatient Hospital Stay (HOSPITAL_COMMUNITY): Payer: Medicare Other | Admitting: Occupational Therapy

## 2018-01-30 ENCOUNTER — Inpatient Hospital Stay (HOSPITAL_COMMUNITY): Payer: Medicare Other | Admitting: Speech Pathology

## 2018-01-30 ENCOUNTER — Inpatient Hospital Stay (HOSPITAL_COMMUNITY): Payer: Medicare Other

## 2018-01-30 ENCOUNTER — Inpatient Hospital Stay (HOSPITAL_COMMUNITY): Payer: Self-pay

## 2018-01-30 MED ORDER — TIZANIDINE HCL 2 MG PO TABS
2.0000 mg | ORAL_TABLET | Freq: Every day | ORAL | Status: DC
  Administered 2018-01-30 – 2018-02-18 (×20): 2 mg via ORAL
  Filled 2018-01-30 (×20): qty 1

## 2018-01-30 MED ORDER — SENNOSIDES-DOCUSATE SODIUM 8.6-50 MG PO TABS
2.0000 | ORAL_TABLET | Freq: Two times a day (BID) | ORAL | Status: DC
  Administered 2018-01-30 – 2018-02-19 (×37): 2 via ORAL
  Filled 2018-01-30 (×39): qty 2

## 2018-01-30 MED ORDER — TRAMADOL HCL 50 MG PO TABS
100.0000 mg | ORAL_TABLET | Freq: Two times a day (BID) | ORAL | Status: DC | PRN
  Administered 2018-01-30 – 2018-02-01 (×4): 100 mg via ORAL
  Filled 2018-01-30 (×5): qty 2

## 2018-01-30 NOTE — Plan of Care (Signed)
  Problem: [facility] Description See Patient Education module for education specifics  01/30/2018 1605 by Kalman Shanotten, Othell Diluzio A, LPN Outcome: Progressing 01/30/2018 1559 by Doran Durandotten, Yichen Gilardi A, LPN Outcome: Progressing   Problem: RH BOWEL ELIMINATION Goal: RH STG MANAGE BOWEL WITH ASSISTANCE Description STG Manage Bowel with mod Assistance.  01/30/2018 1605 by Kalman Shanotten, Madina Galati A, LPN Outcome: Progressing 01/30/2018 1559 by Kalman Shanotten, Genene Kilman A, LPN Outcome: Progressing Goal: RH STG MANAGE BOWEL W/MEDICATION W/ASSISTANCE Description STG Manage Bowel with Medication with mod Assistance.  01/30/2018 1605 by Doran Durandotten, Trishelle Devora A, LPN Outcome: Progressing 01/30/2018 1559 by Kalman Shanotten, [name] A, LPN Outcome: Progressing   Problem: RH SKIN INTEGRITY Goal: RH STG SKIN FREE OF INFECTION/BREAKDOWN Description Skin to remain free from infection and breakdown while on rehab with min assist.  01/30/2018 1605 by Kalman Shanotten, Dianely Krehbiel A, LPN Outcome: Progressing 01/30/2018 1559 by Kalman Shanotten, Demerius Podolak A, LPN Outcome: Progressing Goal: RH STG MAINTAIN SKIN INTEGRITY WITH ASSISTANCE Description STG Maintain Skin Integrity With min Assistance.  01/30/2018 1605 by Kalman Shanotten, Lacye Mccarn A, LPN Outcome: Progressing 01/30/2018 1559 by Kalman Shanotten, Oberia Beaudoin A, LPN Outcome: Progressing   Problem: RH SAFETY Goal: RH STG ADHERE TO SAFETY PRECAUTIONS W/ASSISTANCE/DEVICE Description STG Adhere to Safety Precautions With mod Assistance and use of appropriate assistive Device.  01/30/2018 1605 by Kalman Shanotten, Brittain Hosie A, LPN Outcome: Progressing 01/30/2018 1559 by Kalman Shanotten, Okema Rollinson A, LPN Outcome: Progressing   Problem: RH PAIN MANAGEMENT Goal: RH STG PAIN MANAGED AT OR BELOW PT'S PAIN GOAL Description <4 on a 1-10 pain scale  01/30/2018 1605 by Kalman Shanotten, Jaylean Buenaventura A, LPN Outcome: Progressing 01/30/2018 1559 by Kalman Shanotten, Lorrena Goranson A, LPN Outcome: Progressing   Problem: RH KNOWLEDGE  DEFICIT Goal: RH STG INCREASE KNOWLEDGE OF HYPERTENSION Description Patient and caregiver will demonstrate knowledge of HTN medications, dietary restrictions, and BP parameters as set by the MD with min assist from rehab staff.  01/30/2018 1605 by Kalman Shanotten, Raeley Gilmore A, LPN Outcome: Progressing 01/30/2018 1559 by Kalman Shanotten, Tyrann Donaho A, LPN Outcome: Progressing Goal: RH STG INCREASE KNOWLEGDE OF HYPERLIPIDEMIA Description Patient and caregiver will demonstrate knowledge of HLD medications, and LDL, HDL, and triglycerides with min assist from rehab staff.  01/30/2018 1605 by Kalman Shanotten, Misk Galentine A, LPN Outcome: Progressing 01/30/2018 1559 by Kalman Shanotten, Amaris Delafuente A, LPN Outcome: Progressing Goal: RH STG INCREASE KNOWLEDGE OF STROKE PROPHYLAXIS Description Patient and caregiver will demonstrate knowledge of ASA and Plavix as used for stroke prophylaxis with min assist from rehab staff.   01/30/2018 1605 by Kalman Shanotten, Yarnell Kozloski A, LPN Outcome: Progressing 01/30/2018 1559 by Kalman Shanotten, Randolf Sansoucie A, LPN Outcome: Progressing

## 2018-01-30 NOTE — Progress Notes (Signed)
Physical Therapy Session Note  Patient Details  Name: Wayne HaberGary Griffin MRN: [id number] Date of Birth: 1946/02/15  Today's Date: 01/30/2018 PT Individual Time: 1000-1100 PT Individual Time Calculation (min): 60 min   Short Term Goals: Week 1:  PT Short Term Goal 1 (Week 1): Pt will transfer bed<>chair, max assist +1 PT Short Term Goal 2 (Week 1): Pt will initiate gait training PT Short Term Goal 3 (Week 1): Pt will perform sit<>stand transfer max assist x1 PT Short Term Goal 4 (Week 1): Pt will maintain dynamic sitting balance w/ supervision  Skilled Therapeutic Interventions/Progress Updates:    Pt seated in w/c upon PT arrival, agreeable to therapy tx and denies pain. Pt attempted sit<>stand from w/c seat with stedy, unable to perform with max A +1 from therapist. Pt transported to the gym. Pt performed slideboard transfer from w/c>mat with total assist +1 from therapist, verbal cues for techniques and manual facilitation for weightshifting. While seated edge of mat pt worked on upright sitting posture, reciprocal scooting anterior/posteriorly max assist, lateral scooting with max assist, anterior weightshifting/core strengthening to touch floor and come back up to sitting x 5 min assist, sitting<>sidelying on elbow each side x 5 with min-mod assist, and pushing through LEs to clear buttocks from mat about 2 inches with max assist x 3. Pt performed lateral scoot transfer with slideboard back to w/c with max assist +1, verbal cues for techniques. Pt transported to dayroom. Pt used standing frame this session x 2, while in standing frame pt worked on performing mini squats with slack, upright posture and reaching task with R UE. Pt transported back to room and left seated in w/c with needs in reach, chair alarm set.   Therapy Documentation Precautions:  Precautions Precautions: Fall Precaution Comments: L hemi Restrictions Weight Bearing Restrictions: No    Therapy/Group: Individual  Therapy  Cresenciano GenreEmily van Schagen, PT, DPT 01/30/2018, 7:51 AM

## 2018-01-30 NOTE — Progress Notes (Signed)
Speech Language Pathology Daily Session Note  Patient Details  Name: Wayne Griffin MRN: [id number] Date of Birth: 25-Jul-1945  Today's Date: 01/30/2018   Skilled treatment session #1 SLP Individual Time: 1100-1130 SLP Individual Time Calculation (min): 30 min   Skilled treatment session #2 SLP Individual Time: 1400-1430 SLP Individual Time Calculation (min): 30 min  Short Term Goals: Week 1: SLP Short Term Goal 1 (Week 1): Patient will demonstrate problem solving for complex tasks with Mod I.  SLP Short Term Goal 2 (Week 1): Patient will utilize speech intelligibility strategies at the conversation level with supervision verbal cues to achieve 90% intelligibility.  SLP Short Term Goal 3 (Week 1): Patient will consume current diet without overt s/s of aspiration with Mod I.   Skilled Therapeutic Interventions:     Skilled treatment session #1 focused on cognition goals. SLP facilitated session by providing supervision cues to recall previous activities within sessions in the morning. Was going to attempt medication management tasks but pt with shoulder pain which he was able to effectively advocate for. Pt able to converse with > 90% intelligibility at the simple conversation level.   Skilled treatment session #2 focused on communication goals. SLP received pt in be asleep. He was about to gain arousal and agreeable to therapy. Pt able to express need to use urinal as well as direct care without any episodes of unintelligible speech. Pt currently watching President's cup (golf). He was able to cohesively explain tournament with good organization of thought/detail and fully intelligible speech (with Mod I use of intelligibility strategies). Pt was left supine in bed with bed alarm on and all needs within reach.   Pain Pain Assessment Pain Scale: 0-10 Pain Score: 6  Pain Location: Shoulder  Therapy/Group: Individual Therapy  Ellee Wawrzyniak 01/30/2018, 12:46 PM

## 2018-01-30 NOTE — Plan of Care (Signed)
  Problem: Consults Goal: RH STROKE PATIENT EDUCATION Description See Patient Education module for education specifics  Outcome: Progressing   Problem: RH BOWEL ELIMINATION Goal: RH STG MANAGE BOWEL WITH ASSISTANCE Description STG Manage Bowel with mod Assistance.  Outcome: Progressing Goal: RH STG MANAGE BOWEL W/MEDICATION W/ASSISTANCE Description STG Manage Bowel with Medication with mod Assistance.  Outcome: Progressing   Problem: RH SKIN INTEGRITY Goal: RH STG SKIN FREE OF INFECTION/BREAKDOWN Description Skin to remain free from infection and breakdown while on rehab with min assist.  Outcome: Progressing Goal: RH STG MAINTAIN SKIN INTEGRITY WITH ASSISTANCE Description STG Maintain Skin Integrity With min Assistance.  Outcome: Progressing   Problem: RH SAFETY Goal: RH STG ADHERE TO SAFETY PRECAUTIONS W/ASSISTANCE/DEVICE Description STG Adhere to Safety Precautions With mod Assistance and use of appropriate assistive Device.  Outcome: Progressing   Problem: RH PAIN MANAGEMENT Goal: RH STG PAIN MANAGED AT OR BELOW PT'S PAIN GOAL Description <4 on a 1-10 pain scale  Outcome: Progressing   Problem: RH KNOWLEDGE DEFICIT Goal: RH STG INCREASE KNOWLEDGE OF HYPERTENSION Description Patient and caregiver will demonstrate knowledge of HTN medications, dietary restrictions, and BP parameters as set by the MD with min assist from rehab staff.  Outcome: Progressing Goal: RH STG INCREASE KNOWLEGDE OF HYPERLIPIDEMIA Description Patient and caregiver will demonstrate knowledge of HLD medications, and LDL, HDL, and triglycerides with min assist from rehab staff.  Outcome: Progressing Goal: RH STG INCREASE KNOWLEDGE OF STROKE PROPHYLAXIS Description Patient and caregiver will demonstrate knowledge of ASA and Plavix as used for stroke prophylaxis with min assist from rehab staff.   Outcome: Progressing   

## 2018-01-30 NOTE — Patient Care Conference (Signed)
Inpatient RehabilitationTeam Conference and Plan of Care Update Date: 01/30/2018   Time: 11:35 AM    Patient Name: Wayne Griffin      Medical Record Number: [id number]  Date of Birth: 04-13-1945 Sex: Male         Room/Bed: 4W19C/4W19C-01 Payor Info: Payor: MEDICARE / Plan: MEDICARE PART A AND B / Product Type: *No Product type* /    Admitting Diagnosis: R CVA  Admit Date/Time:  01/28/2018  2:56 PM Admission Comments: No comment available   Primary Diagnosis:  <principal problem not specified> Principal Problem: <principal problem not specified>  Patient Active Problem List   Diagnosis Date Noted  . Essential hypertension 01/28/2018  . Hyperlipidemia 01/28/2018  . Left leg pain 01/28/2018  . Obesity 01/28/2018  . 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 Small vessel disease (HCC) 01/28/2018  . Anxiety   . Cerebrovascular accident (CVA) (HCC)   . History of tobacco use   . Dyslipidemia   . Hemiparesis affecting left side as late effect of stroke (HCC)   . Acute CVA (cerebrovascular accident) [facility]) s/p IV tPA 01/24/2018    Expected Discharge Date: Expected Discharge Date: 02/19/18  Team Members Present: Physician leading conference: Dr. Claudette Laws Social Worker Present: Dossie Der, LCSW Nurse Present: Ronny Bacon, RN PT Present: Woodfin Ganja, PT OT Present: Ardis Rowan, COTA SLP Present: Feliberto Gottron, SLP PPS Coordinator present : Tora Duck, RN, CRRN;Melissa Greta Doom     Current Status/Progress Goal Weekly Team Focus  Medical   Severe left hemiparesis, blood pressure uncontrolled in permissive range  Maintain medical stability, reduce stroke recurrence risk, reduce fall risk  Initiate therapy program   Bowel/Bladder   Mostly continent with periods of incontinence due to urgency.  per Pt LBM few 01/27/2018  Pt will have fewer episode of incontinence and maintain regular bowel program  assess bowel and bladder needs qshift.   Swallow/Nutrition/  Hydration   Dys. 3 textures with thin liquids, Mod I  Mod I   Patient prefers current diet, may focus on trials of regular textures at patient's request    ADL's   max - total overall  min - supervision overall  ADL training, functional mobility, LUE NMR, pt education   Mobility   max-total overall, needs +2 to stand from lower surface, gait unable   min assist   OOB tolerance, endurance, LLE NMR, all functional mobility    Communication   Min A  Mod I   use of speech intelligibility strategies    Safety/Cognition/ Behavioral Observations  Mod I for basic problem solving, Supervision for complex problem solving   Mod I   complex problem solving   Pain   Pt continues to c/o pain to left leg on voltoren gel, tramdol q6 prn, and tyl prn  pain less than 2  assess pain qshift and PRN   Skin   abrasion to right foot, skin tear to left arm  promate proper wound healing. maintain skin integrity  Assess skin qshift and PRN      *See Care Plan and progress notes for long and short-term goals.     Barriers to Discharge  Current Status/Progress Possible Resolutions Date Resolved   Physician    Medical stability     Poor activity tolerance because of knee pain  Adjust medications, patient also not very active prior to CVA      Nursing  Medication compliance;Inaccessible home environment  PT  Inaccessible home environment;Decreased caregiver support;Home environment access/layout  steps to enter home and son works during the day               OT Decreased caregiver support  Son works during the day             SLP                SW Decreased caregiver support Does not have 24 hr care at [place] family works            Discharge Planning/Teaching Needs:  Will need to see if son or grandson can take a FMLA otherwise pt will not have someone there during the day while both work. Will come up with a safe discharge plan      Team Discussion:  Goals supervision-min assist level.  Currently mod/max assist level. Knee pain limits him and BP elevated MD is aware of and working on. Constipated and nursing is working on this,. Feels sick from this and can't eat. MD ordered knee brace for knee stability. Tone in his leg. Need to see if family can provide physical care at [place].  Revisions to Treatment Plan:  DC 12/31    Continued Need for Acute Rehabilitation Level of Care: The patient requires daily medical management by a physician with specialized training in physical medicine and rehabilitation for the following conditions: Daily direction of a multidisciplinary physical rehabilitation program to ensure safe treatment while eliciting the highest outcome that is of practical value to the patient.: Yes Daily medical management of patient stability for increased activity during participation in an intensive rehabilitation regime.: Yes Daily analysis of laboratory values and/or radiology reports with any subsequent need for medication adjustment of medical intervention for : Neurological problems;Blood pressure problems   I attest that I was present, lead the team conference, and concur with the assessment and plan of the team.   Lucy Chrisupree, Maxim Bedel G 01/30/2018, 2:26 PM

## 2018-01-30 NOTE — Progress Notes (Signed)
Pt c/o left flank pain, burning with urination, Scant bright red bloody discharge from penis observed. Jacalyn LefevreEunice Thomas notified. New order for UA C&S. No fever noted.

## 2018-01-30 NOTE — Progress Notes (Signed)
Orthopedic Tech Progress Note Patient Details:  Wayne HaberGary Griffin 18-Feb-1946 [id number]  Ortho Devices Type of Ortho Device: Knee Sleeve Ortho Device/Splint Location: delivered to room. Ortho Device/Splint Interventions: Ordered  when I went to apply the knee sleeve the pt was working with rehab. So I left the knee sleeve in the pts room.     Wayne Griffin, Wayne Griffin 01/30/2018, 10:57 AM

## 2018-01-30 NOTE — Progress Notes (Signed)
Occupational Therapy Session Note  Patient Details  Name: Wayne Griffin MRN: [id number] Date of Birth: [birth year]  Today's Date: 01/30/2018 OT Individual Time: 0800-0900 OT Individual Time Calculation (min): 60 min    Short Term Goals: Week 1:  OT Short Term Goal 1 (Week 1): Pt will bathe LB with mod A. OT Short Term Goal 2 (Week 1): Pt will don shirt with mod A. OT Short Term Goal 3 (Week 1): Pt will don pants with max A. OT Short Term Goal 4 (Week 1): Pt will transfer to toilet with mod-max A of 1. OT Short Term Goal 5 (Week 1): Pt will perform self ROM to LUE with min cues.    Skilled Therapeutic Interventions/Progress Updates:    OT intervention with focus on bed mobility, sitting balance, sit<>stand with Stedy, BADL retraining, activity tolerance, and safety awareness to increase independence with BADLs.  Pt required max A for supine>sit EOB and CGA for sitting balance EOB.  Pt required max A for sit<>stand from EOB with Stedy.  Pt completed bathing/dressing tasks per below with max verbal cues for sequencing and hemi bathing/dressing techniques. Pt required tot A+2 for sit<>stand with Stedy for clothing management.  Pt remained seated in w/c with all needs within reach and belt alarm activated.   Therapy Documentation Precautions:  Precautions Precautions: Fall Precaution Comments: L hemi Restrictions Weight Bearing Restrictions: No  Pain:  Pt c/o 4/10 pain in L upper leg; RN aware and repositioned ADL: ADL Where Assessed-Eating: Bed level Grooming: Minimal assistance Where Assessed-Grooming: Sitting at sink Upper Body Bathing: Minimal assistance Where Assessed-Upper Body Bathing: Sitting at sink Lower Body Bathing: Maximal assistance Where Assessed-Lower Body Bathing: Bed level Upper Body Dressing: Maximal assistance Where Assessed-Upper Body Dressing: Wheelchair Lower Body Dressing: Dependent + 2 Where Assessed-Lower Body Dressing: Bed level   Therapy/Group:  Individual Therapy  Rich BraveLanier, Anik Wesch Chappell 01/30/2018, 10:59 AM

## 2018-01-30 NOTE — Progress Notes (Signed)
Occupational Therapy Session Note  Patient Details  Name: Wayne Griffin MRN: [id number] Date of Birth: [birth year]  Today's Date: 01/30/2018 OT Individual Time: 0900-0930 OT Individual Time Calculation (min): 30 min    Short Term Goals: Week 1:  OT Short Term Goal 1 (Week 1): Pt will bathe LB with mod A. OT Short Term Goal 2 (Week 1): Pt will don shirt with mod A. OT Short Term Goal 3 (Week 1): Pt will don pants with max A. OT Short Term Goal 4 (Week 1): Pt will transfer to toilet with mod-max A of 1. OT Short Term Goal 5 (Week 1): Pt will perform self ROM to LUE with min cues.    Skilled Therapeutic Interventions/Progress Updates:    Pt received in wc and agreeable to therapy.  Pt taken to sink and worked on oral care with set up, washed face with set up.  A for thorough cleansing of hand. Set pt up with L arm on tray table at mid chest height for PROM towel slides, followed by tapping over muscle belly. With cues pt was able to activate some scapular protraction and retraction sliding the towel.  Was able to elicit horizontal adduction with pt using arm to move a drink cup on table.  Repeated that exercises numerous times and education pt on CVA recovery of the arm.  Pt resting in w/c with all needs met.  Therapy Documentation Precautions:  Precautions Precautions: Fall Precaution Comments: L hemi Restrictions Weight Bearing Restrictions: No  Pain: C/o pain in L leg, RN aware  ADL: ADL Eating: Set up Where Assessed-Eating: Bed level Grooming: Minimal assistance Where Assessed-Grooming: Sitting at sink Upper Body Bathing: Minimal assistance Where Assessed-Upper Body Bathing: Sitting at sink Lower Body Bathing: Maximal assistance Where Assessed-Lower Body Bathing: Bed level Upper Body Dressing: Maximal assistance Where Assessed-Upper Body Dressing: Wheelchair Lower Body Dressing: Dependent Where Assessed-Lower Body Dressing: Bed level Toileting: Not assessed Toilet  Transfer: Not assessed Walk-In Shower Transfer: Not assessed   Therapy/Group: Individual Therapy  Raynold Blankenbaker 01/30/2018, 8:29 AM

## 2018-01-30 NOTE — Progress Notes (Signed)
[place] PHYSICAL MEDICINE & REHABILITATION PROGRESS NOTE   Subjective/Complaints:  Left knee and hip pain last noc, had voltaren gel x 2 thus far , has not received L knee sleeve  ROS- neg breathing issues, + constipation, no SOB Objective:   No results found. Recent Labs    01/29/18 0634  WBC 6.9  HGB 12.6*  HCT 37.8*  PLT 140*   Recent Labs    01/29/18 0634  NA 138  K 4.2  CL 103  CO2 26  GLUCOSE 103*  BUN 16  CREATININE 1.27*  CALCIUM 8.6*    Intake/Output Summary (Last 24 hours) at 01/30/2018 0924 Last data filed at 01/30/2018 0918 Gross per 24 hour  Intake 720 ml  Output 600 ml  Net 120 ml     Physical Exam: Vital Signs Blood pressure (!) 161/83, pulse 68, temperature 97.8 F (36.6 C), temperature source Oral, resp. rate 18, height 5\' 11"  (1.803 m), weight 108.2 kg, SpO2 98 %.   General: No acute distress Mood and affect are appropriate Heart: Regular rate and rhythm no rubs murmurs or extra sounds Lungs: Clear to auscultation, breathing unlabored, no rales or wheezes Abdomen: Positive bowel sounds, soft nontender to palpation, nondistended Extremities: No clubbing, cyanosis, or edema Skin: No evidence of breakdown, no evidence of rash Neurologic: Cranial nerves II through XII intact, motor strength is 5/5 in R and 0/5 left deltoid, bicep, tricep, grip, hip flexor, knee extensors, ankle dorsiflexor and plantar flexor Sensory exam normal sensation to light touch and proprioception in bilateral upper and lower extremities Cerebellar exam normal finger to nose to finger as well as heel to shin in bilateral upper and lower extremities Musculoskeletal: Full range of motion in all 4 extremities. No joint swelling Tone increase left hamstrings with flexor withdrawal spasticity  Assessment/Plan: 1. Functional deficits secondary to Left hemiplegia Right subcortical infarct which require 3+ hours per day of interdisciplinary therapy in a comprehensive  inpatient rehab setting.  Physiatrist is providing close team supervision and 24 hour management of active medical problems listed below.  Physiatrist and rehab team continue to assess barriers to discharge/monitor patient progress toward functional and medical goals  Care Tool:  Bathing    Body parts bathed by patient: Chest, Abdomen, Face, Right upper leg, Left arm   Body parts bathed by helper: Right arm, Front perineal area, Buttocks, Left upper leg, Right lower leg, Left lower leg     Bathing assist Assist Level: Maximal Assistance - Patient 24 - 49%     Upper Body Dressing/Undressing Upper body dressing   What is the patient wearing?: Pull over shirt    Upper body assist Assist Level: Maximal Assistance - Patient 25 - 49%    Lower Body Dressing/Undressing Lower body dressing      What is the patient wearing?: Incontinence brief     Lower body assist Assist for lower body dressing: Total Assistance - Patient < 25%     Toileting Toileting    Toileting assist Assist for toileting: Maximal Assistance - Patient 25 - 49%     Transfers Chair/bed transfer  Transfers assist     Chair/bed transfer assist level: 2 Helpers     Locomotion Ambulation   Ambulation assist   Ambulation activity did not occur: Safety/medical concerns          Walk 10 feet activity   Assist  Walk 10 feet activity did not occur: Safety/medical concerns        Walk 50 feet  activity   Assist Walk 50 feet with 2 turns activity did not occur: Safety/medical concerns         Walk 150 feet activity   Assist Walk 150 feet activity did not occur: Safety/medical concerns         Walk 10 feet on uneven surface  activity   Assist Walk 10 feet on uneven surfaces activity did not occur: Safety/medical concerns         Wheelchair     Assist Will patient use wheelchair at discharge?: Yes Type of Wheelchair: Manual    Wheelchair assist level:  Supervision/Verbal cueing Max wheelchair distance: 36'    Wheelchair 50 feet with 2 turns activity    Assist        Assist Level: Supervision/Verbal cueing   Wheelchair 150 feet activity     Assist Wheelchair 150 feet activity did not occur: Safety/medical concerns        Medical Problem List and Plan: 1.  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 Aspirin and Plavix therapy 3 weeks then aspirin alone Team conf in am 2.  DVT Prophylaxis/Anticoagulation: subcutaneous Lovenox. Monitor for any bleeding episodes, thrombocytopenia improving 3. Pain Management:  Tramadol as needed, add voltaren gel and Knee orthosis Change tramadol to 100mg  q12h and add zanaflex at noc 4. Mood: Ativan 0.5 mg every 8 hours as needed for anxiety 5. Neuropsych: This patient is capable of making decisions onhis own behalf. 6. Skin/Wound Care: Routine skin checks 7. Fluids/Electrolytes/Nutrition:  Routine in and out's BMET with mildly elevated creat but normal BUN 8. Hypertension. Norvasc 10 mg daily. Monitor with increased mobility 9. Hyperlipidemia. Lipitor 10. History of tobacco use. Provide counseling  11.  Borderline normocytic anemia, improved vs prior CBC, monitor on ASA, Plavix and Lovenox  LOS: 2 days A FACE TO FACE EVALUATION WAS PERFORMED  Erick Colace 01/30/2018, 9:24 AM

## 2018-01-31 ENCOUNTER — Inpatient Hospital Stay (HOSPITAL_COMMUNITY): Payer: Medicare Other

## 2018-01-31 ENCOUNTER — Inpatient Hospital Stay (HOSPITAL_COMMUNITY): Payer: Medicare Other | Admitting: Physical Therapy

## 2018-01-31 ENCOUNTER — Inpatient Hospital Stay (HOSPITAL_COMMUNITY): Payer: Medicare Other | Admitting: Speech Pathology

## 2018-01-31 LAB — URINALYSIS, ROUTINE W REFLEX MICROSCOPIC
BILIRUBIN URINE: NEGATIVE
Glucose, UA: NEGATIVE mg/dL
Ketones, ur: NEGATIVE mg/dL
Nitrite: NEGATIVE
Protein, ur: 100 mg/dL — AB
RBC / HPF: >50 RBC/hpf — ABNORMAL HIGH (ref 0–5)
Specific Gravity, Urine: 1.02 (ref 1.005–1.030)
pH: 5 (ref 5.0–8.0)

## 2018-01-31 MED ORDER — FLEET ENEMA 7-19 GM/118ML RE ENEM
1.0000 | ENEMA | Freq: Every day | RECTAL | Status: DC | PRN
  Administered 2018-02-09: 1 via RECTAL
  Filled 2018-01-31 (×2): qty 1

## 2018-01-31 MED ORDER — BISACODYL 10 MG RE SUPP
10.0000 mg | Freq: Every day | RECTAL | Status: DC | PRN
  Administered 2018-01-31: 10 mg via RECTAL
  Filled 2018-01-31 (×2): qty 1

## 2018-01-31 NOTE — IPOC Note (Signed)
Overall Plan of Care [facility](IPOC) Patient Details Name: Wayne HaberGary Griffin MRN: [id number] DOB: Dec 13, 1945  Admitting Diagnosis: <principal problem not specified>  Hospital Problems: Active Problems:   Small vessel disease (HCC)     Functional Problem List: Nursing Safety, Motor, Sensory, Endurance, Skin Integrity, Medication Management, Pain  PT Balance, Behavior, Edema, Endurance, Motor, Pain, Safety, Sensory  OT Balance, Endurance, Motor, Pain  SLP    TR         Basic ADL's: OT Bathing, Dressing, Toileting, Grooming     Advanced  ADL's: OT       Transfers: PT Bed to Chair, Bed Mobility, Car, Furniture, Civil Service fast [place]  OT Toilet, Research scientist (life sciences)Tub/Shower     Locomotion: PT Ambulation, Psychologist, prison and probation servicesWheelchair Mobility, Stairs     Additional Impairments: OT Fuctional Use of Upper Extremity  SLP Swallowing, Communication, Social Cognition expression Problem Solving  TR      Anticipated Outcomes Item Anticipated Outcome  Self Feeding independent  Swallowing  Mod I   Basic self-care  min A  Toileting  min A   Bathroom Transfers S to toilet, min A to shower  Bowel/Bladder  supervision assist  Transfers  min assist  Locomotion  min assist short distance gait, supervision w/c mobility   Communication  Mod I  Cognition  Mod I   Pain  <4 on a 1-10 pain scale  Safety/Judgment  mod assist with safety and transfers and appropriate equipment   Therapy Plan: PT Intensity: Minimum of 1-2 x/day ,45 to 90 minutes PT Frequency: 5 out of 7 days PT Duration Estimated Length of Stay: 21-24 days OT Intensity: Minimum of 1-2 x/day, 45 to 90 minutes OT Frequency: 5 out of 7 days OT Duration/Estimated Length of Stay:  20-21 days SLP Intensity: Minumum of 1-2 x/day, 30 to 90 minutes SLP Frequency: 3 to 5 out of 7 days SLP Duration/Estimated Length of Stay: 21-24 days     Team Interventions: Nursing Interventions Patient/Family Education, Disease Management/Prevention, Skin Care/Wound Management, Discharge  Planning, Pain Management, Psychosocial Support, Medication Management  PT interventions Ambulation/gait training, Discharge planning, Functional mobility training, Psychosocial support, Therapeutic Activities, Visual/perceptual remediation/compensation, Wheelchair propulsion/positioning, Therapeutic Exercise, Skin care/wound management, Neuromuscular re-education, Disease management/prevention, Warden/rangerBalance/vestibular training, Cognitive remediation/compensation, DME/adaptive equipment instruction, Pain management, Splinting/orthotics, UE/LE Strength taining/ROM, Community reintegration, Development worker, international aidunctional electrical stimulation, Patient/family education, Museum/gallery curatortair training, UE/LE Coordination activities  OT Interventions Warden/rangerBalance/vestibular training, Discharge planning, DME/adaptive equipment instruction, Functional mobility training, Functional electrical stimulation, Neuromuscular re-education, Patient/family education, Self Care/advanced ADL retraining, Therapeutic Exercise, UE/LE Coordination activities, UE/LE Strength taining/ROM, Therapeutic Activities, Psychosocial support  SLP Interventions Cognitive remediation/compensation, Dysphagia/aspiration precaution training, Internal/external aids, Speech/Language facilitation, Therapeutic Activities, Environmental controls, Cueing hierarchy, Functional tasks, Patient/family education  TR Interventions    SW/CM Interventions Discharge Planning, Psychosocial Support, Patient/Family Education   Barriers to Discharge MD  Medical stability, Home enviroment access/loayout and Lack of/limited family support  Nursing Medication compliance, Inaccessible home environment    PT Inaccessible home environment, Decreased caregiver support, Home environment access/layout steps to enter home and son works during the day   OT Decreased caregiver support Son works during the day  SLP      SW Decreased caregiver support Does not have 24 hr care at [place] family works   Team Discharge  Planning: Destination: PT-Home(SNF if family unable to arrange for 24/7 assist) ,OT- Home , SLP-Home Projected Follow-up: PT-Home health PT, OT-  Home health OT, SLP-(TBD) Projected Equipment Needs: PT-To be determined, OT- Tub/shower seat, 3 in 1 bedside comode, SLP-None recommended by SLP Equipment  Details: PT- , OT-  Patient/family involved in discharge planning: PT- Patient,  OT-Patient, SLP-Patient  MD ELOS: 19-24d Medical Rehab Prognosis:  Good Assessment:  72 year old right-handed male with history of hypertension and tobacco abuse on no prescription medications. Per chart review and patient, patient lives with son. Independent prior to admission using a cane. Son works during the day. One level home with 2-3 steps to entry. Presented 01/24/2018 with left facial droop and left-sided weakness with dysarthria as well as a fall. Cranial CT reviewed, unremarkable for acute intracranial process.  Per report, there was some question of focal hyperdensity at the right MCA bifurcation. Patient did receive TPA. CT angiogram of head and neck negative perfusion study.No large vessel occlusion or stenosis. MRI showed acute/subacute nonhemorrhagic infarction involving the posterior right lentiform nucleus, posterior limb right internal capsule and dorsal caudate. Remote hemorrhagic infarct of the right lentiform nucleus just anterior to the area of the acute subacute infarction. Echocardiogram with ejection fraction of 60% grade 1 diastolic dysfunction. Neurology follow-up placed on aspirin and Plavix therapy 3 weeks then aspirin alone.subcutaneous Lovenox for DVT prophylaxis. Tolerating a mechanical soft diet. Blood pressures monitored initially in need of Cleviprex since improved. Therapy evaluations completed with recommendations of physical medicine rehabilitation consult. Patient was admitted for a comprehensive rehabilitation program.    Now requiring 24/7 Rehab RN,MD, as well as CIR level PT, OT and  SLP.  Treatment team will focus on ADLs and mobility with goals set at [facility] See Team Conference Notes for weekly updates to the plan of care

## 2018-01-31 NOTE — Progress Notes (Signed)
[place] PHYSICAL MEDICINE & REHABILITATION PROGRESS NOTE   Subjective/Complaints:  Refused SLP- pain in Left hip , knee no new trauma, reviewed xrays  ROS- neg breathing issues, + constipation, no SOB Objective:   No results found. Recent Labs    01/29/18 0634  WBC 6.9  HGB 12.6*  HCT 37.8*  PLT 140*   Recent Labs    01/29/18 0634  NA 138  K 4.2  CL 103  CO2 26  GLUCOSE 103*  BUN 16  CREATININE 1.27*  CALCIUM 8.6*    Intake/Output Summary (Last 24 hours) at 01/31/2018 0852 Last data filed at 01/31/2018 0824 Gross per 24 hour  Intake 800 ml  Output 1075 ml  Net -275 ml     Physical Exam: Vital Signs Blood pressure (!) 175/99, pulse 68, temperature 98 F (36.7 C), temperature source Oral, resp. rate 18, height 5\' 11"  (1.803 m), weight 108.2 kg, SpO2 99 %.   General: No acute distress Mood and affect are appropriate Heart: Regular rate and rhythm no rubs murmurs or extra sounds Lungs: Clear to auscultation, breathing unlabored, no rales or wheezes Abdomen: Positive bowel sounds, soft nontender to palpation, nondistended Extremities: No clubbing, cyanosis, or edema Skin: No evidence of breakdown, no evidence of rash Neurologic: Cranial nerves II through XII intact, motor strength is 5/5 in R and 0/5 left deltoid, bicep, tricep, grip, hip flexor, knee extensors, ankle dorsiflexor and plantar flexor Sensory exam normal sensation to light touch and proprioception in bilateral upper and lower extremities Cerebellar exam normal finger to nose to finger as well as heel to shin in bilateral upper and lower extremities Musculoskeletal: Full range of motion in all 4 extremities. No joint swelling Tone increase left hamstrings with flexor withdrawal spasticity  Assessment/Plan: 1. Functional deficits secondary to Left hemiplegia Right subcortical infarct which require 3+ hours per day of interdisciplinary therapy in a comprehensive inpatient rehab  setting.  Physiatrist is providing close team supervision and 24 hour management of active medical problems listed below.  Physiatrist and rehab team continue to assess barriers to discharge/monitor patient progress toward functional and medical goals  Care Tool:  Bathing    Body parts bathed by patient: Chest, Face, Right upper leg, Left upper leg, Right arm, Abdomen   Body parts bathed by helper: Left arm, Front perineal area, Buttocks, Left lower leg, Right lower leg     Bathing assist Assist Level: Maximal Assistance - Patient 24 - 49%     Upper Body Dressing/Undressing Upper body dressing   What is the patient wearing?: Pull over shirt    Upper body assist Assist Level: Maximal Assistance - Patient 25 - 49%    Lower Body Dressing/Undressing Lower body dressing      What is the patient wearing?: Incontinence brief     Lower body assist Assist for lower body dressing: Dependent - Patient 0%     Toileting Toileting    Toileting assist Assist for toileting: Maximal Assistance - Patient 25 - 49%     Transfers Chair/bed transfer  Transfers assist     Chair/bed transfer assist level: 2 Helpers     Locomotion Ambulation   Ambulation assist   Ambulation activity did not occur: Safety/medical concerns          Walk 10 feet activity   Assist  Walk 10 feet activity did not occur: Safety/medical concerns        Walk 50 feet activity   Assist Walk 50 feet with 2 turns  activity did not occur: Safety/medical concerns         Walk 150 feet activity   Assist Walk 150 feet activity did not occur: Safety/medical concerns         Walk 10 feet on uneven surface  activity   Assist Walk 10 feet on uneven surfaces activity did not occur: Safety/medical concerns         Wheelchair     Assist Will patient use wheelchair at discharge?: Yes Type of Wheelchair: Manual    Wheelchair assist level: Supervision/Verbal cueing Max  wheelchair distance: 70'    Wheelchair 50 feet with 2 turns activity    Assist        Assist Level: Supervision/Verbal cueing   Wheelchair 150 feet activity     Assist Wheelchair 150 feet activity did not occur: Safety/medical concerns        Medical Problem List and Plan: 1.  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 Aspirin and Plavix therapy 3 weeks then aspirin alone poor motivation 2.  DVT Prophylaxis/Anticoagulation: subcutaneous Lovenox. Monitor for any bleeding episodes, thrombocytopenia improving 3. Pain Management:  Tramadol as needed, add voltaren gel and Knee orthosis Change tramadol to 100mg  q12h and add zanaflex at noc Complains of pain with normal xray and exam 4. Mood: Ativan 0.5 mg every 8 hours as needed for anxiety 5. Neuropsych: This patient is capable of making decisions onhis own behalf. 6. Skin/Wound Care: Routine skin checks 7. Fluids/Electrolytes/Nutrition:  Routine in and out's BMET with mildly elevated creat but normal BUN 8. Hypertension. Norvasc 10 mg daily. Monitor with increased mobility 9. Hyperlipidemia. Lipitor 10. History of tobacco use. Provide counseling  11.  Borderline normocytic anemia, improved vs prior CBC, monitor on ASA, Plavix and Lovenox 12.  Hematuria , no flank pain or dysuria, on anticoag- may need uro f/u as OP monitor Hgb LOS: 3 days A FACE TO FACE EVALUATION WAS PERFORMED  Erick Colace 01/31/2018, 8:52 AM

## 2018-01-31 NOTE — Progress Notes (Signed)
Physical Therapy Session Note  Patient Details  Name: Zerrick Hanssen MRN: [id number] Date of Birth: [birth year]  Today's Date: 01/31/2018 PT Individual Time: 7588-3254 PT Individual Time Calculation (min): 55 min   Short Term Goals: Week 1:  PT Short Term Goal 1 (Week 1): Pt will transfer bed<>chair, max assist +1 PT Short Term Goal 2 (Week 1): Pt will initiate gait training PT Short Term Goal 3 (Week 1): Pt will perform sit<>stand transfer max assist x1 PT Short Term Goal 4 (Week 1): Pt will maintain dynamic sitting balance w/ supervision  Skilled Therapeutic Interventions/Progress Updates:   Pt in supine and agreeable to therapy, denies pain but feeling overall bad today as he is constipated and nauseous - RN already aware. Session focused on OOB tolerance and functional mobility w/ toileting. Agreeable to transfer to [facility], stedy transfer to [facility], max assist +1 to boost into standing. Close supervision during static sitting on BSC. Performed multiple sit<>stands w/ +2, 2nd helper needed for brief management and pericare. Stedy transfer to w/c from [facility]. Transported to day room and performed Kinetron @ 50 cm/sec w/o manual assist from therapist. After 5 min, pt refusing to participate further 2/2 fatigue. Returned to room and transferred back to EOB and to supine, max assist +1. Ended session in supine, all needs met. Missed 5 min of skilled PT 2/2 fatigue this session.   Therapy Documentation Precautions:  Precautions Precautions: Fall Precaution Comments: L hemi Restrictions Weight Bearing Restrictions: No  Therapy/Group: Individual Therapy  Laikynn Pollio Clent Demark 01/31/2018, 2:58 PM

## 2018-01-31 NOTE — Progress Notes (Signed)
Speech Language Pathology Daily Session Note  Patient Details  Name: Wayne Griffin MRN: [id number] Date of Birth: 02-21-1945  Today's Date: 01/31/2018  Session 1: SLP Individual Time: 0272-53660815-0830 SLP Individual Time Calculation (min): 15 min   Session 2: SLP Individual Time: 4403-47421230-1245 SLP Individual Time Calculation (min): 15 min   Short Term Goals: Week 1: SLP Short Term Goal 1 (Week 1): Patient will demonstrate problem solving for complex tasks with Mod I.  SLP Short Term Goal 2 (Week 1): Patient will utilize speech intelligibility strategies at the conversation level with supervision verbal cues to achieve 90% intelligibility.  SLP Short Term Goal 3 (Week 1): Patient will consume current diet without overt s/s of aspiration with Mod I.   Skilled Therapeutic Interventions:  Session 1: Skilled treatment session focused on cognitive goals. Upon arrival, patient was lethargic while supine in bed and reporting nausea. RN aware. SLP facilitated session by providing Max A verbal cues for recall of his current medications and their functions. Medication management task was not initiated due to lethargy and poor attention to task due to not feeling well. Therefore, session ended early. Patient missed remaining 30 minutes of session. Patient left supine in bed with alarm on. Continue with current plan of care.   Session  2: Skilled treatment session focused on dysphagia goals. SLP faciliated session by providing skilled observation with lunch meal of Dys. 3 textures and thin liquids. Patient was Mod I for use of swallowing strategies and consumed meal without overt s/s of aspiration. Recommend patient continue current diet. Patient placed on bedpan per request and left with call bell within reach. Continue with current plan of care.   Pain No/Denies Pain   Therapy/Group: Individual Therapy  Irma Delancey 01/31/2018, 4:21 PM

## 2018-01-31 NOTE — Progress Notes (Signed)
Occupational Therapy Session Note  Patient Details  Name: Wayne Griffin MRN: [id number] Date of Birth: [birth year]  Today's Date: 01/31/2018 OT Individual Time: 0930-1040 OT Individual Time Calculation (min): 70 min    Short Term Goals: Week 1:  OT Short Term Goal 1 (Week 1): Pt will bathe LB with mod A. OT Short Term Goal 2 (Week 1): Pt will don shirt with mod A. OT Short Term Goal 3 (Week 1): Pt will don pants with max A. OT Short Term Goal 4 (Week 1): Pt will transfer to toilet with mod-max A of 1. OT Short Term Goal 5 (Week 1): Pt will perform self ROM to LUE with min cues.    Skilled Therapeutic Interventions/Progress Updates:    OT intervention with focus on bed mobility and bathing/dressing at bed level.  Pt required min A for UB bathing and mod A for LB bathing at bed level.  Pt required mod A for donning pullover shirt sitting in bed.  Pt required more than a reasonable amount of time to complete tasks and becomes SOB with continued activity.  Pt c/o L shoulder pain and approx 1 finger sublux noted.  Will consider kinesio tape but pt with fragile skin.  Pt also with mild L hand edema.  Pt required mod A for bed mobility.  Pt did not don pants 2/2 RN to administer suppository at end of session.  Pt remained in bed with bed alarm activated and all needs within reach, awaiting RN.   Therapy Documentation Precautions:  Precautions Precautions: Fall Precaution Comments: L hemi Restrictions Weight Bearing Restrictions: No Pain: Pain Assessment Pain Scale: 0-10 Pain Score: 6  Pain Type: Acute pain Pain Location: Shoulder Pain Orientation: Left Pain Descriptors / Indicators: Aching Pain Frequency: Constant Pain Onset: On-going Pain Intervention(s): RN aware and repositioned  Therapy/Group: Individual Therapy  Rich BraveLanier, Wayne Griffin 01/31/2018, 10:50 AM

## 2018-02-01 ENCOUNTER — Inpatient Hospital Stay (HOSPITAL_COMMUNITY): Payer: Medicare Other

## 2018-02-01 ENCOUNTER — Inpatient Hospital Stay (HOSPITAL_COMMUNITY): Payer: Medicare Other | Admitting: Occupational Therapy

## 2018-02-01 LAB — URINE CULTURE: Culture: 10000 — AB

## 2018-02-01 MED ORDER — TRAMADOL HCL 50 MG PO TABS
50.0000 mg | ORAL_TABLET | Freq: Four times a day (QID) | ORAL | Status: DC | PRN
  Administered 2018-02-02 – 2018-02-19 (×26): 50 mg via ORAL
  Filled 2018-02-01 (×27): qty 1

## 2018-02-01 MED ORDER — LORAZEPAM 0.5 MG PO TABS
0.5000 mg | ORAL_TABLET | Freq: Every evening | ORAL | Status: DC | PRN
  Administered 2018-02-01 – 2018-02-03 (×3): 0.5 mg via ORAL
  Filled 2018-02-01 (×3): qty 1

## 2018-02-01 NOTE — Progress Notes (Signed)
Occupational Therapy Session Note  Patient Details  Name: Wayne HaberGary Fahringer MRN: [id number] Date of Birth: 10-01-1945  Today's Date: 02/01/2018 OT Individual Time: [id number] OT Individual Time Calculation (min): 55 min    Short Term Goals: Week 1:  OT Short Term Goal 1 (Week 1): Pt will bathe LB with mod A. OT Short Term Goal 2 (Week 1): Pt will don shirt with mod A. OT Short Term Goal 3 (Week 1): Pt will don pants with max A. OT Short Term Goal 4 (Week 1): Pt will transfer to toilet with mod-max A of 1. OT Short Term Goal 5 (Week 1): Pt will perform self ROM to LUE with min cues.    Skilled Therapeutic Interventions/Progress Updates:    Pt resting in bed upon arrival.  OT intervention with focus on bed mobility, sitting balance, sit<>stand with Stedy, LUE NMR (see below), activity tolerance, and safety awareness to increase independence with BADLs.  Pt requires max A for supine>sit EOB with max multimodal cues for sequencing and technique.  Pt requires occasional CGA for sitting balance and exhibited LOB x 2 with ongoing posterior lean requiring min A to correct.  Pt engaged in UB bathing/dressing activities with mod A to complete tasks.  Although pt did not c/o of pain, he did state that his L shoulder is painful occasionally.  1 finger sublux noted.  Kinesio tape applied for positioning/support and pain management.  Pt LUE flacid. Pt performed sit<>stand with Stedy X 2 with min A and max multimodal cues for weight shifts to R.  Pt with L lean when standing.  Pt returned to bed and remained in bed with all needs within reach and bed alarm activated.   Therapy Documentation Precautions:  Precautions Precautions: Fall Precaution Comments: L hemi Restrictions Weight Bearing Restrictions: No Pain:  Pt denies pain Other Treatments: Treatments Neuromuscular Facilitation: Upper Extremity;Activity to increase timing and sequencing;Activity to increase coordination;Activity to increase motor  control;Activity to increase lateral weight shifting;Activity to increase anterior-posterior weight shifting Weight Bearing Technique Weight Bearing Technique: Yes LUE Weight Bearing Technique: Forearm seated;Extended arm seated   Therapy/Group: Individual Therapy  Rich BraveLanier, Avrum Kimball Chappell 02/01/2018, 2:39 PM

## 2018-02-01 NOTE — Progress Notes (Signed)
Physical Therapy Session Note  Patient Details  Name: Wayne HaberGary Griffin MRN: [id number] Date of Birth: November 16, 1945  Today's Date: 02/01/2018 PT Individual Time: 1000-1055 PT Individual Time Calculation (min): 55 min   Short Term Goals: Week 1:  PT Short Term Goal 1 (Week 1): Pt will transfer bed<>chair, max assist +1 PT Short Term Goal 2 (Week 1): Pt will initiate gait training PT Short Term Goal 3 (Week 1): Pt will perform sit<>stand transfer max assist x1 PT Short Term Goal 4 (Week 1): Pt will maintain dynamic sitting balance w/ supervision  Skilled Therapeutic Interventions/Progress Updates:   Pt in supine and agreeable to therapy, pain as detailed below and reporting his brief was soiled. Min assist R/L rolling in supine for pericare and brief management. Total assist to don thigh high TED hose and LE garments. Transferred to EOB w/ max assist and stedy transfer to w/c, max assist +1. Pt able to maintain static stance in stedy w/ min guard while therapist pulled pants over hips. Worked on sit<>stands at rail in hallway, max assist +1 to boost into standing w/ verbal and manual cues for forward weight shifting and boosting into stance. Max assist to maintain static stance at rail w/ manual cues for L knee and hip extension. 3 stands in total w/ pre-gait tasks including lateral weight shifting and R forward and backward stepping to facilitate increased L quad activation in weight acceptance. Pt self-propelled w/c to day room w/ mod assist via R hemi technique, assist needed to steer. Kinetron 1 min on, 1 min off x5 reps to work on LLE muscle activation and endurance training. Moderate increase in work of breathing w/ all activities and functional movement this session. Returned to room and ended session in w/c, all needs in reach. Awaiting next therapy session.   Therapy Documentation Precautions:  Precautions Precautions: Fall Precaution Comments: L hemi Restrictions Weight Bearing  Restrictions: No Pain: Pain Assessment Pain Scale: 0-10 Pain Score: 5  Pain Type: Acute pain Pain Location: Shoulder Pain Orientation: Left Pain Radiating Towards: left leg Pain Descriptors / Indicators: Aching;Discomfort Pain Frequency: Intermittent Pain Onset: On-going Pain Intervention(s): Medication (See eMAR)  Therapy/Group: Individual Therapy  [name] [name] 02/01/2018, 12:04 PM

## 2018-02-01 NOTE — Progress Notes (Signed)
+/-   sleep. Called at 0330, feeling anxious and SOB. 02 sat 98% RA. Repositioned patient and provided emotional support. No further c/o. Alfredo MartinezMurray, Demetri Kerman A

## 2018-02-01 NOTE — Progress Notes (Signed)
At 2050, reports needing to be changed and left leg,knee to thigh hurts. Scheduled voltaren gel applied to knee. Not time for PRN ultram, paged Dr.Swartz order changed to every 6 hours, med given. At 2210, C/O SOB,reports feeling anxious. "please don;t leave me alone." O2 sats=98% RA. Paged Dr. Riley KillSwartz, order for ativan qs PRN, med given. Currently patient resting quietly.Wayne Griffin, Mikesha Migliaccio A

## 2018-02-01 NOTE — Progress Notes (Signed)
Social Work Patient ID: Wayne Griffin, male   DOB: 03/29/1945, 72 y.o.   MRN: [id number] Met with pt to discuss team conference goals and need for 24 hr care at discharge. He reports she still does not feel well but has had a bowel movement. He will talk with his son this weekend and get back with this worker. He is not sure if son will take a leave from work or if this is even an option. Will work on a discharge plan.

## 2018-02-01 NOTE — Progress Notes (Signed)
Occupational Therapy Session Note  Patient Details  Name: Wayne Griffin MRN: [id number] Date of Birth: 12-06-1945  Today's Date: 02/01/2018 OT Individual Time: 1430-1510 OT Individual Time Calculation (min): 40 min    Short Term Goals: Week 1:  OT Short Term Goal 1 (Week 1): Pt will bathe LB with mod A. OT Short Term Goal 2 (Week 1): Pt will don shirt with mod A. OT Short Term Goal 3 (Week 1): Pt will don pants with max A. OT Short Term Goal 4 (Week 1): Pt will transfer to toilet with mod-max A of 1. OT Short Term Goal 5 (Week 1): Pt will perform self ROM to LUE with min cues.    Skilled Therapeutic Interventions/Progress Updates:    Session focused on increasing self efficacy, therapeutic participation in preferred leisure tasks, and family edu re CVA recovery. Pt received at bed level, refusing OOB mobility. After discussion with pt, pt revealed feeling very down and feelings of hopelessness. 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 A keyboard was obtained and brought into pt's room. Pt spirit's were immediately lifted and he played several songs from memory. Pt was edu re importance of continuing to participate in preferred activities on mood and self-efficacy. Pt's son arrived mid session and had several questions re CVA recovery and use of e-stim. All questions answered to pt's and son's satisfaction. Pt was left supine with all needs met, son present.   Therapy Documentation Precautions:  Precautions Precautions: Fall Precaution Comments: L hemi Restrictions Weight Bearing Restrictions: No    Vital Signs: Therapy Vitals Temp: 98.6 F (37 C) Pulse Rate: 70 Resp: 20 BP: 140/80 Patient Position (if appropriate): Lying Oxygen Therapy SpO2: 97 % Pain: Pain Assessment Pain Scale: 0-10 Pain Score: 0-No pain   Therapy/Group: Individual Therapy  Curtis Sites 02/01/2018, 4:08 PM

## 2018-02-01 NOTE — Progress Notes (Signed)
[place] PHYSICAL MEDICINE & REHABILITATION PROGRESS NOTE   Subjective/Complaints:  No issues overnite  ROS- neg breathing issues, + constipation, no SOB Objective:   No results found. No results for input(s): WBC, HGB, HCT, PLT in the last 72 hours. No results for input(s): NA, K, CL, CO2, GLUCOSE, BUN, CREATININE, CALCIUM in the last 72 hours.  Intake/Output Summary (Last 24 hours) at 02/01/2018 0857 Last data filed at 02/01/2018 0236 Gross per 24 hour  Intake 680 ml  Output 475 ml  Net 205 ml     Physical Exam: Vital Signs Blood pressure (!) 158/77, pulse 73, temperature 98.3 F (36.8 C), resp. rate 16, height 5\' 11"  (1.803 m), weight 108.2 kg, SpO2 98 %.   General: No acute distress Mood and affect are appropriate Heart: Regular rate and rhythm no rubs murmurs or extra sounds Lungs: Clear to auscultation, breathing unlabored, no rales or wheezes Abdomen: Positive bowel sounds, soft nontender to palpation, nondistended Extremities: No clubbing, cyanosis, or edema Skin: No evidence of breakdown, no evidence of rash Neurologic: Cranial nerves II through XII intact, motor strength is 5/5 in R and 0/5 left deltoid, bicep, tricep, grip, hip flexor, knee extensors, ankle dorsiflexor and plantar flexor Sensory exam normal sensation to light touch and proprioception in bilateral upper and lower extremities Cerebellar exam normal finger to nose to finger as well as heel to shin in bilateral upper and lower extremities Musculoskeletal: Full range of motion in all 4 extremities. No joint swelling Tone increase left hamstrings with flexor withdrawal spasticity  Assessment/Plan: 1. Functional deficits secondary to Left hemiplegia Right subcortical infarct which require 3+ hours per day of interdisciplinary therapy in a comprehensive inpatient rehab setting.  Physiatrist is providing close team supervision and 24 hour management of active medical problems listed  below.  Physiatrist and rehab team continue to assess barriers to discharge/monitor patient progress toward functional and medical goals  Care Tool:  Bathing    Body parts bathed by patient: Left arm, Chest, Abdomen, Front perineal area, Right upper leg, Left upper leg   Body parts bathed by helper: Right arm, Buttocks, Right lower leg     Bathing assist Assist Level: Moderate Assistance - Patient 50 - 74%     Upper Body Dressing/Undressing Upper body dressing   What is the patient wearing?: Pull over shirt    Upper body assist Assist Level: Moderate Assistance - Patient 50 - 74%    Lower Body Dressing/Undressing Lower body dressing      What is the patient wearing?: Incontinence brief     Lower body assist Assist for lower body dressing: Dependent - Patient 0%     Toileting Toileting    Toileting assist Assist for toileting: 2 Helpers     Transfers Chair/bed transfer  Transfers assist     Chair/bed transfer assist level: 2 Helpers(stedy)     Locomotion Ambulation   Ambulation assist   Ambulation activity did not occur: Safety/medical concerns          Walk 10 feet activity   Assist  Walk 10 feet activity did not occur: Safety/medical concerns        Walk 50 feet activity   Assist Walk 50 feet with 2 turns activity did not occur: Safety/medical concerns         Walk 150 feet activity   Assist Walk 150 feet activity did not occur: Safety/medical concerns         Walk 10 feet on uneven surface  activity  Assist Walk 10 feet on uneven surfaces activity did not occur: Safety/medical concerns         Wheelchair     Assist Will patient use wheelchair at discharge?: Yes Type of Wheelchair: Manual    Wheelchair assist level: Supervision/Verbal cueing Max wheelchair distance: 82'    Wheelchair 50 feet with 2 turns activity    Assist        Assist Level: Supervision/Verbal cueing   Wheelchair 150 feet  activity     Assist Wheelchair 150 feet activity did not occur: Safety/medical concerns        Medical Problem List and Plan: 1.  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 Aspirin and Plavix therapy 3 weeks then aspirin alone poor motivation 2.  DVT Prophylaxis/Anticoagulation: subcutaneous Lovenox. Monitor for any bleeding episodes, thrombocytopenia improving 3. Pain Management:  Tramadol as needed, add voltaren gel and Knee orthosis- improved pain last noc, mainly medial joint line Change tramadol to 100mg  q12h and add zanaflex at noc Complains of pain with normal xray and exam 4. Mood: Ativan 0.5 mg every 8 hours as needed for anxiety 5. Neuropsych: This patient is capable of making decisions onhis own behalf. 6. Skin/Wound Care: Routine skin checks 7. Fluids/Electrolytes/Nutrition:  Routine in and out's BMET with mildly elevated creat but normal BUN 8. Hypertension. Norvasc 10 mg daily. Monitor with increased mobility 9. Hyperlipidemia. Lipitor 10. History of tobacco use. Provide counseling  11.  Borderline normocytic anemia, improved vs prior CBC, monitor on ASA, Plavix and Lovenox 12.  Hematuria , no flank pain or dysuria, on anticoag- may need uro f/u as OP monitor Hgb- recheck on 12/16 LOS: 4 days A FACE TO FACE EVALUATION WAS PERFORMED  Erick Colace 02/01/2018, 8:57 AM

## 2018-02-01 NOTE — Progress Notes (Signed)
Speech Language Pathology Daily Session Note  Patient Details  Name: Wayne Griffin MRN: [id number] Date of Birth: [birth year]  Today's Date: 02/01/2018 SLP Individual Time: 1105-1205 SLP Individual Time Calculation (min): 60 min  Short Term Goals: Week 1: SLP Short Term Goal 1 (Week 1): Patient will demonstrate problem solving for complex tasks with Mod I.  SLP Short Term Goal 2 (Week 1): Patient will utilize speech intelligibility strategies at the conversation level with supervision verbal cues to achieve 90% intelligibility.  SLP Short Term Goal 3 (Week 1): Patient will consume current diet without overt s/s of aspiration with Mod I.   Skilled Therapeutic Interventions:   Skilled ST interventions included problem solving for complex tasks and education re: current diet and safe swallowing strategies. Outcomes: SLP assisted with developed of medication list for current list of medications. Given set-up assistance d/t decreased movement/ROM with upper LE and given min-mod verbal/visual cues to reference medication list, pt demonstrated ability to properly organize 6/7 medications in pill organizer. Pt stated that he mis-managed 1/7 medications d/t "reading the wrong line" on his medication list. Education provided re: importance of medication management at home in order to decrease risk of hospitalizations from either under-dosing or over-dosing medications. Pt verbalized understanding. Medication list placed in 2nd drawer of bedside table per patient request. Meal arrived towards end of session, however pt stated that he was not hungry. Pt able to verbalize correct diet and independently verbalized the following safe swallowing strategies performed during previous meals (I.e. chewing on right side, alternating l/s and solids with puree consistencies, slow rate). Education provided re: importance of implementing strategies to decrease risk of potential coughing/choking episodes and/or  aspiration/aspiration PNA. Pt verbalized understanding and stated goal of upgrading to a regular diet before d/c from hospital. Pt left upright in chair with call bell and necessary items within reach. Continue POC.  Pain Pain Assessment Pain Scale: 0-10 Pain Score: 5  Pain Type: Acute pain Pain Location: Shoulder Pain Orientation: Left Pain Radiating Towards: left leg Pain Descriptors / Indicators: Discomfort Pain Frequency: Intermittent Pain Onset: On-going Patients Stated Pain Goal: 0 Pain Intervention(s): Medication (See eMAR) - (Intervention via nursing staff. SLP present as nurse provided pain management.)  Therapy/Group: Individual Therapy  Everlean PattersonNancy Theador Jezewski, M.S. CCC-SLP Speech-Language Pathologist  Everlean Pattersonancy Sehar Sedano 02/01/2018, 12:32 PM

## 2018-02-02 DIAGNOSIS — I639 Cerebral infarction, unspecified: Secondary | ICD-10-CM

## 2018-02-02 NOTE — Plan of Care (Signed)
  Problem: [facility] Description See Patient Education module for education specifics  02/02/2018 1701 by Kalman Shanotten, Shigeru Lampert A, LPN Outcome: Progressing 02/02/2018 1659 by Doran Durandotten, Jaylan Hinojosa A, LPN Outcome: Progressing   Problem: RH BOWEL ELIMINATION Goal: RH STG MANAGE BOWEL WITH ASSISTANCE Description STG Manage Bowel with mod Assistance.  02/02/2018 1701 by Kalman Shanotten, Shawnelle Spoerl A, LPN Outcome: Progressing 02/02/2018 1659 by Kalman Shanotten, Ghislaine Harcum A, LPN Outcome: Progressing Goal: RH STG MANAGE BOWEL W/MEDICATION W/ASSISTANCE Description STG Manage Bowel with Medication with mod Assistance.  02/02/2018 1701 by Kalman Shanotten, Amy Belloso A, LPN Outcome: Progressing 02/02/2018 1659 by Kalman Shanotten, Shaheed Schmuck A, LPN Outcome: Progressing   Problem: RH SKIN INTEGRITY Goal: RH STG SKIN FREE OF INFECTION/BREAKDOWN Description Skin to remain free from infection and breakdown while on rehab with min assist.  02/02/2018 1701 by Kalman Shanotten, Autum Benfer A, LPN Outcome: Progressing 02/02/2018 1659 by Kalman Shanotten, Monetta Lick A, LPN Outcome: Progressing Goal: RH STG MAINTAIN SKIN INTEGRITY WITH ASSISTANCE Description STG Maintain Skin Integrity With min Assistance.  02/02/2018 1701 by Kalman Shanotten, Annlouise Gerety A, LPN Outcome: Progressing 02/02/2018 1659 by Kalman Shanotten, Becket Wecker A, LPN Outcome: Progressing   Problem: RH SAFETY Goal: RH STG ADHERE TO SAFETY PRECAUTIONS W/ASSISTANCE/DEVICE Description STG Adhere to Safety Precautions With mod Assistance and use of appropriate assistive Device.  02/02/2018 1701 by Kalman Shanotten, Lliam Hoh A, LPN Outcome: Progressing 02/02/2018 1659 by Doran Durandotten, Ty Oshima A, LPN Outcome: Progressing   Problem: RH PAIN MANAGEMENT Goal: RH STG PAIN MANAGED AT OR BELOW PT'S PAIN GOAL Description <4 on a 1-10 pain scale  02/02/2018 1701 by Kalman Shanotten, Chayse Gracey A, LPN Outcome: Progressing 02/02/2018 1659 by Kalman Shanotten, Yena Tisby A, LPN Outcome: Progressing   Problem: RH KNOWLEDGE  DEFICIT Goal: RH STG INCREASE KNOWLEDGE OF HYPERTENSION Description Patient and caregiver will demonstrate knowledge of HTN medications, dietary restrictions, and BP parameters as set by the MD with min assist from rehab staff.  02/02/2018 1701 by Kalman Shanotten, Phoenyx Melka A, LPN Outcome: Progressing 02/02/2018 1659 by Kalman Shanotten, Trevion Hoben A, LPN Outcome: Progressing Goal: RH STG INCREASE KNOWLEGDE OF HYPERLIPIDEMIA Description Patient and caregiver will demonstrate knowledge of HLD medications, and LDL, HDL, and triglycerides with min assist from rehab staff.  02/02/2018 1701 by Kalman Shanotten, Demontray Franta A, LPN Outcome: Progressing 02/02/2018 1659 by Kalman Shanotten, Davion Meara A, LPN Outcome: Progressing Goal: RH STG INCREASE KNOWLEDGE OF STROKE PROPHYLAXIS Description Patient and caregiver will demonstrate knowledge of ASA and Plavix as used for stroke prophylaxis with min assist from rehab staff.   02/02/2018 1701 by Kalman Shanotten, Elmar Antigua A, LPN Outcome: Progressing 02/02/2018 1659 by Kalman Shanotten, Calib Wadhwa A, LPN Outcome: Progressing

## 2018-02-02 NOTE — Progress Notes (Signed)
Rested without complaint of rest of night after taking ativan. Alfredo MartinezMurray, Maggi Hershkowitz A

## 2018-02-02 NOTE — Progress Notes (Signed)
[place] PHYSICAL MEDICINE & REHABILITATION PROGRESS NOTE   Subjective/Complaints:  No complaints had a good night. Denies pain  ROS: Patient denies fever, rash, sore throat, blurred vision, nausea, vomiting, diarrhea, cough, shortness of breath or chest pain, joint or back pain, headache, or mood change.    Objective:   No results found. No results for input(s): WBC, HGB, HCT, PLT in the last 72 hours. No results for input(s): NA, K, CL, CO2, GLUCOSE, BUN, CREATININE, CALCIUM in the last 72 hours.  Intake/Output Summary (Last 24 hours) at 02/02/2018 1216 Last data filed at 02/02/2018 0813 Gross per 24 hour  Intake 1200 ml  Output 375 ml  Net 825 ml     Physical Exam: Vital Signs Blood pressure (!) 155/82, pulse 64, temperature 97.6 F (36.4 C), temperature source Oral, resp. rate 18, height 5\' 11"  (1.803 m), weight 108.2 kg, SpO2 96 %.   Constitutional: No distress . Vital signs reviewed. HEENT: EOMI, oral membranes moist Neck: supple Cardiovascular: RRR without murmur. No JVD    Respiratory: CTA Bilaterally without wheezes or rales. Normal effort    GI: BS +, non-tender, non-distended  Skin: No evidence of breakdown, no evidence of rash Neurologic: Cranial nerves II through XII intact, motor strength is 5/5 in R and 0/5 left deltoid, bicep, tricep, grip, hip flexor, knee extensors, ankle dorsiflexor and plantar flexor Sensory exam normal sensation to light touch and proprioception in bilateral upper and lower extremities   Musculoskeletal: Full range of motion in all 4 extremities. No joint swelling Tone increase left hamstrings with flexor withdrawal spasticity continues  Assessment/Plan: 1. Functional deficits secondary to Left hemiplegia Right subcortical infarct which require 3+ hours per day of interdisciplinary therapy in a comprehensive inpatient rehab setting.  Physiatrist is providing close team supervision and 24 hour management of active medical problems  listed below.  Physiatrist and rehab team continue to assess barriers to discharge/monitor patient progress toward functional and medical goals  Care Tool:  Bathing    Body parts bathed by patient: Left arm, Chest, Abdomen, Front perineal area, Right upper leg, Left upper leg   Body parts bathed by helper: Right arm, Buttocks, Right lower leg     Bathing assist Assist Level: Moderate Assistance - Patient 50 - 74%     Upper Body Dressing/Undressing Upper body dressing   What is the patient wearing?: Pull over shirt    Upper body assist Assist Level: Moderate Assistance - Patient 50 - 74%    Lower Body Dressing/Undressing Lower body dressing      What is the patient wearing?: Incontinence brief     Lower body assist Assist for lower body dressing: Dependent - Patient 0%     Toileting Toileting    Toileting assist Assist for toileting: 2 Helpers     Transfers Chair/bed transfer  Transfers assist     Chair/bed transfer assist level: Dependent - Patient 0%(stedy)     Locomotion Ambulation   Ambulation assist   Ambulation activity did not occur: Safety/medical concerns          Walk 10 feet activity   Assist  Walk 10 feet activity did not occur: Safety/medical concerns        Walk 50 feet activity   Assist Walk 50 feet with 2 turns activity did not occur: Safety/medical concerns         Walk 150 feet activity   Assist Walk 150 feet activity did not occur: Safety/medical concerns  Walk 10 feet on uneven surface  activity   Assist Walk 10 feet on uneven surfaces activity did not occur: Safety/medical concerns         Wheelchair     Assist Will patient use wheelchair at discharge?: Yes Type of Wheelchair: Manual    Wheelchair assist level: Moderate Assistance - Patient 50 - 74% Max wheelchair distance: 150'    Wheelchair 50 feet with 2 turns activity    Assist        Assist Level: Moderate Assistance  - Patient 50 - 74%   Wheelchair 150 feet activity     Assist Wheelchair 150 feet activity did not occur: Safety/medical concerns   Assist Level: Moderate Assistance - Patient 50 - 74%    Medical Problem List and Plan: 1.  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 Aspirin and Plavix therapy 3 weeks then aspirin alone poor motivation  --Continue CIR therapies including PT, OT, and SLP  2.  DVT Prophylaxis/Anticoagulation: subcutaneous Lovenox. Monitor for any bleeding episodes, thrombocytopenia improving 3. Pain Management:  Tramadol as needed, added voltaren gel and Knee orthosis- improved pain,  mainly medial joint line  Changed tramadol to 100mg  q12h and add zanaflex at noc Complains of pain with normal xray and exam 4. Mood: Ativan 0.5 mg every 8 hours as needed for anxiety 5. Neuropsych: This patient is capable of making decisions onhis own behalf. 6. Skin/Wound Care: Routine skin checks 7. Fluids/Electrolytes/Nutrition:  Routine in and out's BMET with mildly elevated creat but normal BUN 8. Hypertension. Norvasc 10 mg daily. Monitor with increased mobility 9. Hyperlipidemia. Lipitor 10. History of tobacco use. Provide counseling  11.  Borderline normocytic anemia, improved vs prior CBC, monitor on ASA, Plavix and Lovenox 12.  Hematuria , no flank pain or dysuria, on anticoag- may need uro f/u as OP monitor Hgb- recheck on 12/16   LOS: 5 days A FACE TO FACE EVALUATION WAS PERFORMED  Ranelle Oyster 02/02/2018, 12:16 PM

## 2018-02-02 NOTE — Plan of Care (Signed)
  Problem: Consults Goal: RH STROKE PATIENT EDUCATION Description See Patient Education module for education specifics  Outcome: Progressing   Problem: RH BOWEL ELIMINATION Goal: RH STG MANAGE BOWEL WITH ASSISTANCE Description STG Manage Bowel with mod Assistance.  Outcome: Progressing Goal: RH STG MANAGE BOWEL W/MEDICATION W/ASSISTANCE Description STG Manage Bowel with Medication with mod Assistance.  Outcome: Progressing   Problem: RH SKIN INTEGRITY Goal: RH STG SKIN FREE OF INFECTION/BREAKDOWN Description Skin to remain free from infection and breakdown while on rehab with min assist.  Outcome: Progressing Goal: RH STG MAINTAIN SKIN INTEGRITY WITH ASSISTANCE Description STG Maintain Skin Integrity With min Assistance.  Outcome: Progressing   Problem: RH SAFETY Goal: RH STG ADHERE TO SAFETY PRECAUTIONS W/ASSISTANCE/DEVICE Description STG Adhere to Safety Precautions With mod Assistance and use of appropriate assistive Device.  Outcome: Progressing   Problem: RH PAIN MANAGEMENT Goal: RH STG PAIN MANAGED AT OR BELOW PT'S PAIN GOAL Description <4 on a 1-10 pain scale  Outcome: Progressing   Problem: RH KNOWLEDGE DEFICIT Goal: RH STG INCREASE KNOWLEDGE OF HYPERTENSION Description Patient and caregiver will demonstrate knowledge of HTN medications, dietary restrictions, and BP parameters as set by the MD with min assist from rehab staff.  Outcome: Progressing Goal: RH STG INCREASE KNOWLEGDE OF HYPERLIPIDEMIA Description Patient and caregiver will demonstrate knowledge of HLD medications, and LDL, HDL, and triglycerides with min assist from rehab staff.  Outcome: Progressing Goal: RH STG INCREASE KNOWLEDGE OF STROKE PROPHYLAXIS Description Patient and caregiver will demonstrate knowledge of ASA and Plavix as used for stroke prophylaxis with min assist from rehab staff.   Outcome: Progressing   

## 2018-02-03 ENCOUNTER — Inpatient Hospital Stay (HOSPITAL_COMMUNITY): Payer: Medicare Other

## 2018-02-03 NOTE — Progress Notes (Signed)
At 2000, complaining of feeling anxious and SOB. PRN ativan given at 2001. PRN tylenol given at 2251 for C/O left leg pain. Ultram given at 2325. Rested good from 2400-0530. Continent of urine, needs assistance with urinal. Alfredo MartinezMurray, Hira Trent A

## 2018-02-03 NOTE — Progress Notes (Signed)
Physical Therapy Session Note  Patient Details  Name: Wayne HaberGary Griffin MRN: [id number] Date of Birth: [birth year]  Today's Date: 02/03/2018 PT Individual Time: 1000-1100 and 1300-1358 PT Individual Time Calculation (min): 60 min and 58 min  Short Term Goals: Week 1:  PT Short Term Goal 1 (Week 1): Pt will transfer bed<>chair, max assist +1 PT Short Term Goal 2 (Week 1): Pt will initiate gait training PT Short Term Goal 3 (Week 1): Pt will perform sit<>stand transfer max assist x1 PT Short Term Goal 4 (Week 1): Pt will maintain dynamic sitting balance w/ supervision  Skilled Therapeutic Interventions/Progress Updates:   Session 1:  Pt supine in bed upon PT arrival, agreeable to therapy tx and denies pain. Pt performed rolling/briding in order to don pants. Pt transferred from supine>sidelying>sitting with mod assist. Pt performed sit<>stand within stedy from elevate bed mod assist, transferred to w/c with stedy dependently. Pt transported to the gym. Pt performed slideboard transfer from w/c>mat with mod assist, verbal cues for techniques. Pt performed sit<>stand from mat within stedy x 1 this session max assist. Within stedy pt worked on standing tolerance and balance while tossing horseshoes, pt fatigues quickly. Pt performed x 4 sit<>stands throughout session from elevated stedy seat with mod assist. Pt transferred from mat>w/c with slideboard and mod assist, transported back to room. Pt requesting to get back in bed. Slideboard transfer to bed with mod assist, transferred to supine with mod assist and left with needs in reach, bed alarm set.   Session 2: Pt supine in bed upon PT arrival, agreeable to therapy tx and denies pain at rest. Pt transferred to sitting EOB with mod assist and performed slideboard transfer to w/c with mod assist, verbal cues for techniques. Pt propelled w/c to the dayroom with CGA-min assist for steering, R hemi technique x 100 ft. Pt used kinetron this session for LE  strengthening and neuro re-ed 4 x 1 min bouts on 50 cm/sec. Pt propelled w/c to the gym x 150 ft with min assist. Pt performed x 4 sit<>stands at the rail this session with max assist. In standing pt worked on pre-gait stepping in place with each LE, total assist to block L LE during stance while stepping with R LE. Pt worked on standing balance without UE support to toss horseshoes, x 2 trials. Pt worked on lateral weightshifting from side to side while standing with manual facilitation. Pt transported back to room, used urinal with set up assist. Pt requesting to get back in bed, slideboard to bed with mod assist and verbal cues for techniques. Pt transferred to supine with mod assist, left with needs in reach and bed alarm set.    Therapy Documentation Precautions:  Precautions Precautions: Fall Precaution Comments: L hemi Restrictions Weight Bearing Restrictions: No    Therapy/Group: Individual Therapy  Cresenciano GenreEmily van Schagen, PT, DPT 02/03/2018, 7:53 AM

## 2018-02-03 NOTE — Plan of Care (Signed)
  Problem: Consults Goal: RH STROKE PATIENT EDUCATION Description See Patient Education module for education specifics  Outcome: Progressing   Problem: RH BOWEL ELIMINATION Goal: RH STG MANAGE BOWEL WITH ASSISTANCE Description STG Manage Bowel with mod Assistance.  Outcome: Progressing Goal: RH STG MANAGE BOWEL W/MEDICATION W/ASSISTANCE Description STG Manage Bowel with Medication with mod Assistance.  Outcome: Progressing   Problem: RH SKIN INTEGRITY Goal: RH STG SKIN FREE OF INFECTION/BREAKDOWN Description Skin to remain free from infection and breakdown while on rehab with min assist.  Outcome: Progressing Goal: RH STG MAINTAIN SKIN INTEGRITY WITH ASSISTANCE Description STG Maintain Skin Integrity With min Assistance.  Outcome: Progressing   Problem: RH SAFETY Goal: RH STG ADHERE TO SAFETY PRECAUTIONS W/ASSISTANCE/DEVICE Description STG Adhere to Safety Precautions With mod Assistance and use of appropriate assistive Device.  Outcome: Progressing   Problem: RH PAIN MANAGEMENT Goal: RH STG PAIN MANAGED AT OR BELOW PT'S PAIN GOAL Description <4 on a 1-10 pain scale  Outcome: Progressing   Problem: RH KNOWLEDGE DEFICIT Goal: RH STG INCREASE KNOWLEDGE OF HYPERTENSION Description Patient and caregiver will demonstrate knowledge of HTN medications, dietary restrictions, and BP parameters as set by the MD with min assist from rehab staff.  Outcome: Progressing Goal: RH STG INCREASE KNOWLEGDE OF HYPERLIPIDEMIA Description Patient and caregiver will demonstrate knowledge of HLD medications, and LDL, HDL, and triglycerides with min assist from rehab staff.  Outcome: Progressing Goal: RH STG INCREASE KNOWLEDGE OF STROKE PROPHYLAXIS Description Patient and caregiver will demonstrate knowledge of ASA and Plavix as used for stroke prophylaxis with min assist from rehab staff.   Outcome: Progressing   

## 2018-02-03 NOTE — Progress Notes (Signed)
[place] PHYSICAL MEDICINE & REHABILITATION PROGRESS NOTE   Subjective/Complaints:  Rested well last night after ativan and tramadol. Pt admitted to me that he felt anxiety was the biggest problem  ROS: Patient denies fever, rash, sore throat, blurred vision, nausea, vomiting, diarrhea, cough, shortness of breath or chest pain, joint or back pain, headache, or mood change.   Objective:   No results found. No results for input(s): WBC, HGB, HCT, PLT in the last 72 hours. No results for input(s): NA, K, CL, CO2, GLUCOSE, BUN, CREATININE, CALCIUM in the last 72 hours.  Intake/Output Summary (Last 24 hours) at 02/03/2018 1050 Last data filed at 02/03/2018 0830 Gross per 24 hour  Intake 1320 ml  Output 775 ml  Net 545 ml     Physical Exam: Vital Signs Blood pressure (!) 175/83, pulse 67, temperature 97.9 F (36.6 C), temperature source Oral, resp. rate 18, height 5\' 11"  (1.803 m), weight 108.2 kg, SpO2 95 %.   Constitutional: No distress . Vital signs reviewed. HEENT: EOMI, oral membranes moist Neck: supple Cardiovascular: RRR without murmur. No JVD    Respiratory: CTA Bilaterally without wheezes or rales. Normal effort    GI: BS +, non-tender, non-distended  Skin: No evidence of breakdown, no evidence of rash Neurologic: Cranial nerves II through XII intact, speech dysarthric. motor strength is 5/5 in R and 0/5 left deltoid, bicep, tricep, grip, hip flexor, knee extensors, ankle dorsiflexor and plantar flexor Sensory exam normal sensation to light touch and proprioception in bilateral upper and lower extremities   Musculoskeletal: Full range of motion in all 4 extremities. No joint swelling Tone increase left hamstrings with flexor withdrawal spasticity continues without substantial change  Assessment/Plan: 1. Functional deficits secondary to Left hemiplegia Right subcortical infarct which require 3+ hours per day of interdisciplinary therapy in a comprehensive inpatient  rehab setting.  Physiatrist is providing close team supervision and 24 hour management of active medical problems listed below.  Physiatrist and rehab team continue to assess barriers to discharge/monitor patient progress toward functional and medical goals  Care Tool:  Bathing    Body parts bathed by patient: Left arm, Chest, Abdomen, Front perineal area, Right upper leg, Left upper leg   Body parts bathed by helper: Right arm, Buttocks, Right lower leg     Bathing assist Assist Level: Moderate Assistance - Patient 50 - 74%     Upper Body Dressing/Undressing Upper body dressing   What is the patient wearing?: Pull over shirt    Upper body assist Assist Level: Moderate Assistance - Patient 50 - 74%    Lower Body Dressing/Undressing Lower body dressing      What is the patient wearing?: Incontinence brief     Lower body assist Assist for lower body dressing: Dependent - Patient 0%     Toileting Toileting    Toileting assist Assist for toileting: 2 Helpers     Transfers Chair/bed transfer  Transfers assist     Chair/bed transfer assist level: Dependent - Patient 0%(stedy)     Locomotion Ambulation   Ambulation assist   Ambulation activity did not occur: Safety/medical concerns          Walk 10 feet activity   Assist  Walk 10 feet activity did not occur: Safety/medical concerns        Walk 50 feet activity   Assist Walk 50 feet with 2 turns activity did not occur: Safety/medical concerns         Walk 150 feet activity  Assist Walk 150 feet activity did not occur: Safety/medical concerns         Walk 10 feet on uneven surface  activity   Assist Walk 10 feet on uneven surfaces activity did not occur: Safety/medical concerns         Wheelchair     Assist Will patient use wheelchair at discharge?: Yes Type of Wheelchair: Manual    Wheelchair assist level: Moderate Assistance - Patient 50 - 74% Max wheelchair  distance: 150'    Wheelchair 50 feet with 2 turns activity    Assist        Assist Level: Moderate Assistance - Patient 50 - 74%   Wheelchair 150 feet activity     Assist Wheelchair 150 feet activity did not occur: Safety/medical concerns   Assist Level: Moderate Assistance - Patient 50 - 74%    Medical Problem List and Plan: 1.  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 Aspirin and Plavix therapy 3 weeks then aspirin alone poor motivation  --Continue CIR therapies including PT, OT, and SLP  2.  DVT Prophylaxis/Anticoagulation: subcutaneous Lovenox. Monitor for any bleeding episodes, thrombocytopenia improving 3. Pain Management:  Tramadol as needed, added voltaren gel and Knee orthosis- improved pain,  mainly medial joint line  Changed tramadol to 50mg  q6 prn with zanaflex at noc   4. Mood: Ativan 0.5 mg scheduled at night seems to be helping 5. Neuropsych: This patient is capable of making decisions onhis own behalf. 6. Skin/Wound Care: Routine skin checks 7. Fluids/Electrolytes/Nutrition:  Routine in and out's BMET with mildly elevated creat but normal BUN 8. Hypertension. Norvasc 10 mg daily. Monitor with increased mobility 9. Hyperlipidemia. Lipitor 10. History of tobacco use. Provide counseling  11.  Borderline normocytic anemia, improved vs prior CBC, monitor on ASA, Plavix and Lovenox 12.  Hematuria , no flank pain or dysuria, on anticoag- may need uro f/u as OP monitor Hgb- recheck on 12/16   LOS: 6 days A FACE TO FACE EVALUATION WAS PERFORMED  Ranelle Oyster 02/03/2018, 10:50 AM

## 2018-02-03 NOTE — Progress Notes (Signed)
Occupational Therapy Session Note  Patient Details  Name: Wayne Griffin MRN: [id number] Date of Birth: [birth year]  Today's Date: 02/03/2018 OT Individual Time: 0350-0938 OT Individual Time Calculation (min): 73 min    Short Term Goals: Week 1:  OT Short Term Goal 1 (Week 1): Pt will bathe LB with mod A. OT Short Term Goal 2 (Week 1): Pt will don shirt with mod A. OT Short Term Goal 3 (Week 1): Pt will don pants with max A. OT Short Term Goal 4 (Week 1): Pt will transfer to toilet with mod-max A of 1. OT Short Term Goal 5 (Week 1): Pt will perform self ROM to LUE with min cues.    Skilled Therapeutic Interventions/Progress Updates:    1;1. Pt received in bed with no complaints of pain. Pt declines bathing this session. Pt requires total A overall for donning teds, non slip socks and pants. Pt able to partially bridge hips 3x to advance pants past hips. Pt completes stedy transfer with MOD A to sit to stand in stedy and transfer into w/c from EOB. Pt completes UB dressing with S to doff old shirt and MOD A for threading LUE/pulling shirt down back. Pt able to recall hemi techniques with reminder to move L sleeve past elbow prior to threading head. Pt completes 5x1 min intervals on kenetron seated in w/c for reciprocalmovement training, LLE strengthening and endurance. Pt requested kinetron training, and preferred classic country music playing in background. Pt propels w/c to tx gym with hemi technique and intermittant min A fo rsteering. Pt completes squat pivot transfers w/c<>EOM with MOD A for lifting and VC for head heips relationship. Pt completes 2x10 modified sit ups for trunk control/core strengthening with CGA. Pt returned to room/bed with similar A as obove with stedy. Exited session call light in reah, all needs met, exit alarm on  Therapy Documentation Precautions:  Precautions Precautions: Fall Precaution Comments: L hemi Restrictions Weight Bearing Restrictions: No General:    Vital Signs:  Pain: Pain Assessment Pain Scale: 0-10 Pain Score: 0-No pain Faces Pain Scale: No hurt ADL: ADL Eating: Set up Where Assessed-Eating: Bed level Grooming: Minimal assistance Where Assessed-Grooming: Sitting at sink Upper Body Bathing: Minimal assistance Where Assessed-Upper Body Bathing: Sitting at sink Lower Body Bathing: Maximal assistance Where Assessed-Lower Body Bathing: Bed level Upper Body Dressing: Maximal assistance Where Assessed-Upper Body Dressing: Wheelchair Lower Body Dressing: Dependent Where Assessed-Lower Body Dressing: Bed level Toileting: Not assessed Toilet Transfer: Not assessed Gaffer Transfer: Not assessed Vision   Perception    Praxis   Exercises:   Other Treatments:     Therapy/Group: Individual Therapy  Tonny Branch 02/03/2018, 8:34 AM

## 2018-02-04 ENCOUNTER — Inpatient Hospital Stay (HOSPITAL_COMMUNITY): Payer: Medicare Other | Admitting: Speech Pathology

## 2018-02-04 ENCOUNTER — Inpatient Hospital Stay (HOSPITAL_COMMUNITY): Payer: Medicare Other

## 2018-02-04 ENCOUNTER — Inpatient Hospital Stay (HOSPITAL_COMMUNITY): Payer: Medicare Other | Admitting: *Deleted

## 2018-02-04 DIAGNOSIS — N179 Acute kidney failure, unspecified: Secondary | ICD-10-CM

## 2018-02-04 DIAGNOSIS — F419 Anxiety disorder, unspecified: Secondary | ICD-10-CM

## 2018-02-04 DIAGNOSIS — I10 Essential (primary) hypertension: Secondary | ICD-10-CM

## 2018-02-04 DIAGNOSIS — D62 Acute posthemorrhagic anemia: Secondary | ICD-10-CM

## 2018-02-04 DIAGNOSIS — R0989 Other specified symptoms and signs involving the circulatory and respiratory systems: Secondary | ICD-10-CM

## 2018-02-04 LAB — CBC
HCT: 38.9 % — ABNORMAL LOW (ref 39.0–52.0)
HEMOGLOBIN: 12.9 g/dL — AB (ref 13.0–17.0)
MCH: 27.7 pg (ref 26.0–34.0)
MCHC: 33.2 g/dL (ref 30.0–36.0)
MCV: 83.5 fL (ref 80.0–100.0)
Platelets: 138 10*3/uL — ABNORMAL LOW (ref 150–400)
RBC: 4.66 MIL/uL (ref 4.22–5.81)
RDW: 14.4 % (ref 11.5–15.5)
WBC: 6.3 10*3/uL (ref 4.0–10.5)
nRBC: 0 % (ref 0.0–0.2)

## 2018-02-04 LAB — CREATININE, SERUM
Creatinine, Ser: 1.61 mg/dL — ABNORMAL HIGH (ref 0.61–1.24)
GFR calc Af Amer: 49 mL/min — ABNORMAL LOW (ref >60)
GFR calc non Af Amer: 42 mL/min — ABNORMAL LOW (ref >60)

## 2018-02-04 MED ORDER — CLONAZEPAM 0.5 MG PO TABS: 0.2500 mg | ORAL_TABLET | Freq: Three times a day (TID) | ORAL | Status: DC

## 2018-02-04 MED ORDER — CLONAZEPAM 0.25 MG PO TBDP
0.2500 mg | ORAL_TABLET | Freq: Three times a day (TID) | ORAL | Status: DC
  Administered 2018-02-04 – 2018-02-19 (×45): 0.25 mg via ORAL
  Filled 2018-02-04 (×47): qty 1

## 2018-02-04 NOTE — Progress Notes (Signed)
Speech Language Pathology Discharge Summary  Patient Details  Name: Wayne Griffin MRN: [id number] Date of Birth: 20-Oct-1945  Today's Date: 02/04/2018 SLP Individual Time: 0720-0800 SLP Individual Time Calculation (min): 40 min   Skilled Therapeutic Interventions:   Skilled treatment session focused on cognitive goals. Upon arrival, patient was supine in bed and reported he did not feel well and was lethargic from not sleeping at night. MD aware. SLP facilitated session by providing a complex money management task in which patient was Mod I to complete. Verbal assistance needed at times due to visual deficits (wearing brother's glasses). Patient also stated that he would like to stay on mechanical soft textures and did not wish to be upgraded at this time. SLP provided encouragement to attempt regular textures, however, patient continues to decline. Patient missed remaining 20 mins of session due to fatigue. Patient left upright in bed with alarm on and all needs within reach. Continue with current plan of care.   Patient has met 3 of 3 long term goals.  Patient to discharge at overall Modified Independent level.   Reasons goals not met: N/A   Clinical Impression/Discharge Summary: Patient has made functional gains and has met 3 of 3 LTGs this admission. Currently, patient is overall Mod I to complete functional and mildly complex tasks safely and is overall Mod I for recall and use of swallowing compensatory strategies with Dys. 3 textures and thin liquids. Patient is also overall Mod I for recall use of speech intelligibility strategies at the conversation level. Patient is at his baseline, therefore, he will be discharged from skilled SLP intervention with no f/u warranted at this time.   Care Partner:  Caregiver Able to Provide Assistance: Yes     Recommendation:  None      Equipment: N/A   Reasons for discharge: Treatment goals met   Patient/Family Agrees with Progress Made and Goals  Achieved: Yes    [place], [place] 02/04/2018, 3:07 PM

## 2018-02-04 NOTE — Progress Notes (Signed)
[place] PHYSICAL MEDICINE & REHABILITATION PROGRESS NOTE   Subjective/Complaints: Patient seen sitting up in bed this morning working with SLP.  He states he slept fairly overnight.  He is noted to be anxious.  ROS: + Anxiety. Denies CP, SOB, N/V/D  Objective:   No results found. Recent Labs    02/04/18 0536  WBC 6.3  HGB 12.9*  HCT 38.9*  PLT 138*   Recent Labs    02/04/18 0536  CREATININE 1.61*    Intake/Output Summary (Last 24 hours) at 02/04/2018 1218 Last data filed at 02/04/2018 1050 Gross per 24 hour  Intake 660 ml  Output 525 ml  Net 135 ml     Physical Exam: Vital Signs Blood pressure (!) 151/83, pulse 81, temperature 98.6 F (37 C), resp. rate 18, height 5\' 11"  (1.803 m), weight 108.2 kg, SpO2 92 %. Constitutional: No distress . Vital signs reviewed. HENT: Normocephalic.  Atraumatic. Eyes: EOMI. No discharge. Cardiovascular: RRR. No JVD. Respiratory: CTA Bilaterally. Normal effort.  Nasal cannula present, but set to 0L GI: BS +. Non-distended. Musc: No edema or tenderness in extremities. Neurologic: Alert. Motor: 5/5 in RUE/RLE 0/5 left upper extremity proximal distal Left lower extremity: Hip flexion, knee extension 3-/5, ankle dorsiflexion 1/5 Skin: No evidence of breakdown, no evidence of rash Psych: Affect anxious  Assessment/Plan: 1. Functional deficits secondary to Left hemiplegia Right subcortical infarct which require 3+ hours per day of interdisciplinary therapy in a comprehensive inpatient rehab setting.  Physiatrist is providing close team supervision and 24 hour management of active medical problems listed below.  Physiatrist and rehab team continue to assess barriers to discharge/monitor patient progress toward functional and medical goals  Care Tool:  Bathing    Body parts bathed by patient: Left arm, Chest, Abdomen, Front perineal area, Right upper leg, Left upper leg   Body parts bathed by helper: Right arm, Buttocks, Right  lower leg     Bathing assist Assist Level: Moderate Assistance - Patient 50 - 74%     Upper Body Dressing/Undressing Upper body dressing   What is the patient wearing?: Pull over shirt    Upper body assist Assist Level: Moderate Assistance - Patient 50 - 74%    Lower Body Dressing/Undressing Lower body dressing      What is the patient wearing?: Incontinence brief     Lower body assist Assist for lower body dressing: Dependent - Patient 0%     Toileting Toileting    Toileting assist Assist for toileting: 2 Helpers     Transfers Chair/bed transfer  Transfers assist     Chair/bed transfer assist level: Moderate Assistance - Patient 50 - 74%     Locomotion Ambulation   Ambulation assist   Ambulation activity did not occur: Safety/medical concerns          Walk 10 feet activity   Assist  Walk 10 feet activity did not occur: Safety/medical concerns        Walk 50 feet activity   Assist Walk 50 feet with 2 turns activity did not occur: Safety/medical concerns         Walk 150 feet activity   Assist Walk 150 feet activity did not occur: Safety/medical concerns         Walk 10 feet on uneven surface  activity   Assist Walk 10 feet on uneven surfaces activity did not occur: Safety/medical concerns         Wheelchair     Assist Will patient use wheelchair  at discharge?: Yes Type of Wheelchair: Manual    Wheelchair assist level: Moderate Assistance - Patient 50 - 74% Max wheelchair distance: 150'    Wheelchair 50 feet with 2 turns activity    Assist        Assist Level: Moderate Assistance - Patient 50 - 74%   Wheelchair 150 feet activity     Assist Wheelchair 150 feet activity did not occur: Safety/medical concerns   Assist Level: Moderate Assistance - Patient 50 - 74%    Medical Problem List and Plan: 1.  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 Aspirin and Plavix therapy 3 weeks then aspirin alone poor motivation  Continue CIR 2.  DVT Prophylaxis/Anticoagulation: subcutaneous Lovenox. Monitor for any bleeding episodes, thrombocytopenia improving 3. Pain Management:  Tramadol as needed, added voltaren gel and Knee orthosis- improved pain,  mainly medial joint line  Changed tramadol to 50mg  q6 prn with zanaflex at noc 4. Mood: Ativan DC'd, Klonopin 0.25 3 times daily 5. Neuropsych: This patient is?  Fully capable of making decisions onhis own behalf. 6. Skin/Wound Care: Routine skin checks 7. Fluids/Electrolytes/Nutrition:  Routine in and out's  8. Hypertension. Norvasc 10 mg daily. Monitor with increased mobility  Labile and elevated.  Will consider medication adjustments tomorrow if persistently elevated 9. Hyperlipidemia. Lipitor 10. History of tobacco use. Provide counseling  11.  Borderline normocytic anemia:   Hemoglobin 12.9 on 12/16  Continue to monitor 12.  Hematuria:?  Resolved 13.  AKI  Creatinine 1.61 on 12/16  Continue to monitor   LOS: 7 days A FACE TO FACE EVALUATION WAS PERFORMED  Ollie Delano Karis Juba 02/04/2018, 12:18 PM

## 2018-02-04 NOTE — Progress Notes (Signed)
Occupational Therapy Session Note  Patient Details  Name: Wayne HaberGary Griffin MRN: [id number] Date of Birth: 1945-03-17  Today's Date: 02/04/2018 OT Individual Time: 0930-1000 OT Individual Time Calculation (min): 30 min  and Today's Date: 02/04/2018 OT Missed Time: 45 Minutes Missed Time Reason: Pain   Short Term Goals: Week 2:  OT Short Term Goal 1 (Week 2): Pt will transfer to toilet with mod-max A of 1. OT Short Term Goal 2 (Week 2): Pt will perform self ROM to LUE with min cues.   OT Short Term Goal 3 (Week 2): Pt will complete bathing tasks with min A OT Short Term Goal 4 (Week 2): Pt will don pullover shirt with min A  Skilled Therapeutic Interventions/Progress Updates:    OT intervention with focus on bed mobility and sitting balance EOB.  Pt c/o increased L shoulder pain and L knee pain.  Pain meds admin as therapist entered room.  Pt requires max A for bed mobility and min A for sitting balance EOB with min verbal cues for weight shifts.  Pt unable to continue 2/2 pain.  Pt missed 45 mins skilled OT services.   Therapy Documentation Precautions:  Precautions Precautions: Fall Precaution Comments: L hemi Restrictions Weight Bearing Restrictions: No General: General OT Amount of Missed Time: 45 Minutes  Pain: Pain Assessment Pain Scale: 0-10 Pain Score: 6  Pain Type: Acute pain Pain Location: Shoulder Pain Orientation: Left Pain Descriptors / Indicators: Aching Pain Frequency: Intermittent Pain Onset: Gradual Patients Stated Pain Goal: 0 Pain Intervention(s): Medication (See eMAR)  Therapy/Group: Individual Therapy  Rich BraveLanier, Jonta Gastineau Chappell 02/04/2018, 10:40 AM

## 2018-02-04 NOTE — Progress Notes (Signed)
Physical Therapy Session Note  Patient Details  Name: Wayne Griffin MRN: [id number] Date of Birth: 1945/08/17  Today's Date: 02/05/2018 PT Individual Time: [id number] PT Individual Time Calculation (min): 58 min   Short Term Goals: Week 1:  PT Short Term Goal 1 (Week 1): Pt will transfer bed<>chair, max assist +1 PT Short Term Goal 2 (Week 1): Pt will initiate gait training PT Short Term Goal 3 (Week 1): Pt will perform sit<>stand transfer max assist x1 PT Short Term Goal 4 (Week 1): Pt will maintain dynamic sitting balance w/ supervision  Skilled Therapeutic Interventions/Progress Updates:    Pt supine in bed upon PT arrival, agreeable to therapy tx and denies pain at rest. Pt reports that he feels like he cannot breath, RN already aware of situation, SpO2 99%. Therapist explains importance of OOB activity tolerance for breathing. Pt donned pants in supine with mod assist for rolling and to pull over hips. Pt transferred to sitting EOB with mod assist and performed sit<>stand within stedy mod assist. Dependent transfer to w/c using stedy lift. Pt transported to the gym. Pt performed squat pivot to mat with mod assist, verbal cues for techniques. While sitting edge of mat therapist emphasized upright posture and breathing techniques with verbal cues for these. Pt worked on dynamic sitting balance and core strength to hit ball back and forth, verbal cues for upright posture. Pt worked on L LE NMR to kick ball x 5, pt requiring max encouragement for activities throughout session. Pt performed squat pivot back to w/c with max assist. Pt performed x 3 sit<>stands at the R rail this session with mod-max assist. In standing pt worked on static standing balance without UE support, lateral weightshifting and pre-gait stepping in place with UE support on rail, max assist and therapist blocking L knee total assist. Pt transported to dayroom and used kinetron 4 x 1 min bouts for LE strengthening and endurance. Pt  transported back to room and left seated in w/c with needs in reach and chair alarm set. Encouragement to stay OOB for upright tolerance.   Therapy Documentation Precautions:  Precautions Precautions: Fall Precaution Comments: L hemi Restrictions Weight Bearing Restrictions: No    Therapy/Group: Individual Therapy  Cresenciano GenreEmily van Schagen, PT, DPT  02/04/2018, 7:47 AM

## 2018-02-04 NOTE — Progress Notes (Signed)
Occupational Therapy Weekly Progress Note  Patient Details  Name: Wayne Griffin MRN: [id number] Date of Birth: 02/14/1946  Beginning of progress report period: January 29, 2018 End of progress report period: February 04, 2018  Patient has met 3 of 5 short term goals.  Pt is making slow but consistent progress with BADLs.  Pt continues to c/o LUE shoulder pain.  Kinesio tape applied for pain management and shoulder positioning.  Pt required mod A for bathing and UB dressing tasks seated EOB.  Pt requires max A for LB dressing tasks.  Pt requires max A for squat pivot transfers or min A for sit<>stand with Stedy.  Toilet transfsers with [employer]. Pt requires mod/max verbal cues for sequencing and technique for LUE SROM.     Patient continues to demonstrate the following deficits: muscle weakness, decreased oxygen support, impaired coordination, tone, and decreased sitting balance, decreased standing balance, hemiplegia and decreased balance strategies and therefore will continue to benefit from skilled OT intervention to enhance overall performance with BADL.  Patient progressing toward long term goals..  Continue plan of care.  OT Short Term Goals Week 1:  OT Short Term Goal 1 (Week 1): Pt will bathe LB with mod A. OT Short Term Goal 1 - Progress (Week 1): Met OT Short Term Goal 2 (Week 1): Pt will don shirt with mod A. OT Short Term Goal 2 - Progress (Week 1): Met OT Short Term Goal 3 (Week 1): Pt will don pants with max A. OT Short Term Goal 3 - Progress (Week 1): Met OT Short Term Goal 4 (Week 1): Pt will transfer to toilet with mod-max A of 1. OT Short Term Goal 4 - Progress (Week 1): Progressing toward goal OT Short Term Goal 5 (Week 1): Pt will perform self ROM to LUE with min cues.   OT Short Term Goal 5 - Progress (Week 1): Progressing toward goal Week 2:  OT Short Term Goal 1 (Week 2): Pt will transfer to toilet with mod-max A of 1. OT Short Term Goal 2 (Week 2): Pt will perform self  ROM to LUE with min cues.   OT Short Term Goal 3 (Week 2): Pt will complete bathing tasks with min A OT Short Term Goal 4 (Week 2): Pt will don pullover shirt with min A      Therapy Documentation  Leroy Libman 02/04/2018, 10:39 AM

## 2018-02-05 ENCOUNTER — Inpatient Hospital Stay (HOSPITAL_COMMUNITY): Payer: Self-pay

## 2018-02-05 ENCOUNTER — Inpatient Hospital Stay (HOSPITAL_COMMUNITY): Payer: Self-pay | Admitting: Physical Therapy

## 2018-02-05 ENCOUNTER — Inpatient Hospital Stay (HOSPITAL_COMMUNITY): Payer: Medicare Other | Admitting: Physical Therapy

## 2018-02-05 NOTE — Progress Notes (Signed)
Occupational Therapy Session Note  Patient Details  Name: Wayne HaberGary Griffin MRN: [id number] Date of Birth: [birth year]  Today's Date: 02/05/2018 OT Individual Time: 1300-1426 OT Individual Time Calculation (min): 86 min    Short Term Goals: Week 2:  OT Short Term Goal 1 (Week 2): Pt will transfer to toilet with mod-max A of 1. OT Short Term Goal 2 (Week 2): Pt will perform self ROM to LUE with min cues.   OT Short Term Goal 3 (Week 2): Pt will complete bathing tasks with min A OT Short Term Goal 4 (Week 2): Pt will don pullover shirt with min A  Skilled Therapeutic Interventions/Progress Updates:    Pt resting in bed upon arrival.  Pt stated his L shoulder was painful and he had set up in his w/c approx 2 ours in the morning which exacerbated the discomfort.  Kinesio tape had been applied 4 days prior and still intact.  PA requested to order a sling for comfort. Pt declined OOB activities but sat EOB to engaged in UB bathing/dressing tasks.  Pt requires mod A for supine>sit EOB and min A for sitting balance.  Pt completed UB bathing tasks with min A and UB dressing tasks with mod A using hemi dressing techniques.  Pt engaged in LUE NMR (see below).  No active movement/activation noted.  Retrograde massage performed for edema management. Pt returned to supine and remained in bed with all needs within reach and bed alarm activated.   Therapy Documentation Precautions:  Precautions Precautions: Fall Precaution Comments: L hemi Restrictions Weight Bearing Restrictions: No   Pain: Pt c/o L shoulder pain (4/10); repositioned and requested PA to order sling for comfort   Other Treatments: Treatments Neuromuscular Facilitation: Left;Upper Extremity;Activity to increase coordination;Activity to increase motor control;Activity to increase sustained activation Manual Therapy Manual Therapy: Edema management Edema Management: retrograde massage L hand Weight Bearing Technique LUE Weight Bearing  Technique: Forearm seated;Extended arm seated   Therapy/Group: Individual Therapy  Rich BraveLanier, Opie Maclaughlin Chappell 02/05/2018, 2:29 PM

## 2018-02-05 NOTE — Plan of Care (Signed)
  Problem: Consults Goal: RH STROKE PATIENT EDUCATION Description See Patient Education module for education specifics  Outcome: Progressing   Problem: RH BOWEL ELIMINATION Goal: RH STG MANAGE BOWEL WITH ASSISTANCE Description STG Manage Bowel with mod Assistance.  Outcome: Progressing Goal: RH STG MANAGE BOWEL W/MEDICATION W/ASSISTANCE Description STG Manage Bowel with Medication with mod Assistance.  Outcome: Progressing   Problem: RH SKIN INTEGRITY Goal: RH STG SKIN FREE OF INFECTION/BREAKDOWN Description Skin to remain free from infection and breakdown while on rehab with min assist.  Outcome: Progressing Goal: RH STG MAINTAIN SKIN INTEGRITY WITH ASSISTANCE Description STG Maintain Skin Integrity With min Assistance.  Outcome: Progressing   Problem: RH SAFETY Goal: RH STG ADHERE TO SAFETY PRECAUTIONS W/ASSISTANCE/DEVICE Description STG Adhere to Safety Precautions With mod Assistance and use of appropriate assistive Device.  Outcome: Progressing   Problem: RH PAIN MANAGEMENT Goal: RH STG PAIN MANAGED AT OR BELOW PT'S PAIN GOAL Description <4 on a 1-10 pain scale  Outcome: Progressing   Problem: RH KNOWLEDGE DEFICIT Goal: RH STG INCREASE KNOWLEDGE OF HYPERTENSION Description Patient and caregiver will demonstrate knowledge of HTN medications, dietary restrictions, and BP parameters as set by the MD with min assist from rehab staff.  Outcome: Progressing Goal: RH STG INCREASE KNOWLEGDE OF HYPERLIPIDEMIA Description Patient and caregiver will demonstrate knowledge of HLD medications, and LDL, HDL, and triglycerides with min assist from rehab staff.  Outcome: Progressing Goal: RH STG INCREASE KNOWLEDGE OF STROKE PROPHYLAXIS Description Patient and caregiver will demonstrate knowledge of ASA and Plavix as used for stroke prophylaxis with min assist from rehab staff.   Outcome: Progressing

## 2018-02-05 NOTE — Progress Notes (Signed)
Physical Therapy Session Note  Patient Details  Name: Wayne HaberGary Zuelke MRN: [id number] Date of Birth: [birth year]  Today's Date: 02/05/2018 PT Individual Time: [id number] PT Individual Time Calculation (min): 23 min   Short Term Goals: Week 1:  PT Short Term Goal 1 (Week 1): Pt will transfer bed<>chair, max assist +1 PT Short Term Goal 2 (Week 1): Pt will initiate gait training PT Short Term Goal 3 (Week 1): Pt will perform sit<>stand transfer max assist x1 PT Short Term Goal 4 (Week 1): Pt will maintain dynamic sitting balance w/ supervision  Skilled Therapeutic Interventions/Progress Updates:   Pt in supine and needed max encouragement to participate in therapy today, states "I was sleeping when you came in and it's just not worth doing anything". No c/o pain. Worked on OOB tolerance, educated pt on importance of remaining out of bed as much as possible for recovery. Pt declined sitting in recliner, agreeable to sit in w/c until 10:30am. Transferred to EOB and donned pants/socks w/ max assist, max sit<>stand in stedy w/ pt using RUE to bring pants over hips. Stedy transfer to w/c. Assisted pt w/ repositioning in w/c for increased tolerance. Ended session in w/c, all needs in reach.   Therapy Documentation Precautions:  Precautions Precautions: Fall Precaution Comments: L hemi Restrictions Weight Bearing Restrictions: No Pain: Pain Assessment Pain Scale: 0-10 Pain Score: 0-No pain Faces Pain Scale: No hurt  Therapy/Group: Individual Therapy  Markee Remlinger Melton KrebsK Kentley Blyden 02/05/2018, 9:54 AM

## 2018-02-05 NOTE — Progress Notes (Signed)
Orthopedic Tech Progress Note Patient Details:  Wayne HaberGary Griffin 1945/11/25 [id number]  Ortho Devices Type of Ortho Device: Sling immobilizer Ortho Device/Splint Location: lue Ortho Device/Splint Interventions: Ordered, Application, Adjustment   Post Interventions Patient Tolerated: Well Instructions Provided: Care of device, Adjustment of device   [name], [name] 02/05/2018, 2:22 PM

## 2018-02-05 NOTE — Progress Notes (Signed)
Physical Therapy Weekly Progress Note  Patient Details  Name: Wayne Griffin MRN: [id number] Date of Birth: [birth year]  Beginning of progress report period: January 29, 2018 End of progress report period: February 05, 2018  Today's Date: 02/05/2018 PT Individual Time: 9249-3241 PT Individual Time Calculation (min): 70 min   Patient has met 4 of 4 short term goals. Pt is progressing with functional mobility however continues to self limit and require max encouragement for participation. Pt is also limited by L shoulder pain, therapy has implemented kinesiotaping and use of a sling to help with this.   Patient continues to demonstrate the following deficits muscle weakness, decreased coordination and decreased sitting balance, decreased standing balance, decreased postural control, hemiplegia and decreased balance strategies and therefore will continue to benefit from skilled PT intervention to increase functional independence with mobility.  Patient progressing toward long term goals..  Continue plan of care.  PT Short Term Goals Week 1:  PT Short Term Goal 1 (Week 1): Pt will transfer bed<>chair, max assist +1 PT Short Term Goal 1 - Progress (Week 1): Met PT Short Term Goal 2 (Week 1): Pt will initiate gait training PT Short Term Goal 2 - Progress (Week 1): Met PT Short Term Goal 3 (Week 1): Pt will perform sit<>stand transfer max assist x1 PT Short Term Goal 3 - Progress (Week 1): Met PT Short Term Goal 4 (Week 1): Pt will maintain dynamic sitting balance w/ supervision PT Short Term Goal 4 - Progress (Week 1): Met Week 2:  PT Short Term Goal 1 (Week 2): Pt will perform bed<>chair transfer with mod assist PT Short Term Goal 2 (Week 2): Pt will ambulate x 10 ft max assist +1 PT Short Term Goal 3 (Week 2): Pt will perform sit<>stand consistently with mod assist.   Skilled Therapeutic Interventions/Progress Updates:    Pt supine in bed upon PT arrival, agreeable to therapy tx and denies  pain. Pt donned pants, assist to loop LEs through and pt performed rolling in each direction with mod assist to pull pants over hips. Pt transferred to sitting with mod assist. Pt performed sit<>stand within stedy max assist and dependent transfer using stedy lift to w/c. Pt propelled w/c to the gym x 150 ft with supervision. Pt performed sit<>stands x 4 at the rail this session with mod-max assist. Pt ambulated 2 x 5 ft with max assist and +2 for w/c follow, pt able to advance L LE during swing but requiring assist for placement, max assist to block L knee during stance. Pt transferred from w/c<>mat this session with mod assist. Pt performed sit<>stand within stedy from mat max assist. Pt worked on standing activity tolerance and balance within stedy while tossing horseshoes x 2 trials, verbal cues for upright posture. Pt performed x 5 sit<>stands from elevated stedy seat for LE strengthening and NMR, manual facilitation for L lateral weightshift for increased weightbearing over L LE. Pt transferred to w/c with stedy. Pt propelled x 50 ft with min assist. Pt performed squat pivot max assist to bed and left supine with needs in reach, bed alarm set.   Therapy Documentation Precautions:  Precautions Precautions: Fall Precaution Comments: L hemi Restrictions Weight Bearing Restrictions: No   Therapy/Group: Individual Therapy  Netta Corrigan, PT, DPT 02/05/2018, 1:15 PM

## 2018-02-05 NOTE — Progress Notes (Signed)
[place] PHYSICAL MEDICINE & REHABILITATION PROGRESS NOTE   Subjective/Complaints: Patient seen laying in bed this morning.  He states he did not sleep well overnight due to frequent urination, although he states he feels better this morning.  ROS: + Frequent urination.  Denies CP, SOB, N/V/D  Objective:   No results found. Recent Labs    02/04/18 0536  WBC 6.3  HGB 12.9*  HCT 38.9*  PLT 138*   Recent Labs    02/04/18 0536  CREATININE 1.61*    Intake/Output Summary (Last 24 hours) at 02/05/2018 0951 Last data filed at 02/04/2018 1700 Gross per 24 hour  Intake 440 ml  Output 225 ml  Net 215 ml     Physical Exam: Vital Signs Blood pressure (!) 157/90, pulse 72, temperature (!) 97.3 F (36.3 C), temperature source Oral, resp. rate 18, height 5\' 11"  (1.803 m), weight 108.2 kg, SpO2 99 %. Constitutional: No distress . Vital signs reviewed. HENT: Normocephalic.  Atraumatic. Eyes: EOMI. No discharge. Cardiovascular: RRR.  No JVD. Respiratory: CTA bilaterally.  Normal effort.  + Nasal cannula, but set to 0L GI: BS +. Non-distended. Musc: No edema or tenderness in extremities. Neurologic: Alert. Motor: 5/5 in RUE/RLE, unchanged 0/5 left upper extremity proximal distal, unchanged Left lower extremity: Hip flexion, knee extension 3-/5, ankle dorsiflexion 1/5 Skin: No evidence of breakdown, no evidence of rash Psych: Affect anxious  Assessment/Plan: 1. Functional deficits secondary to Left hemiplegia Right subcortical infarct which require 3+ hours per day of interdisciplinary therapy in a comprehensive inpatient rehab setting.  Physiatrist is providing close team supervision and 24 hour management of active medical problems listed below.  Physiatrist and rehab team continue to assess barriers to discharge/monitor patient progress toward functional and medical goals  Care Tool:  Bathing    Body parts bathed by patient: Left arm, Chest, Abdomen, Front perineal  area, Right upper leg, Left upper leg   Body parts bathed by helper: Right arm, Buttocks, Right lower leg     Bathing assist Assist Level: Moderate Assistance - Patient 50 - 74%     Upper Body Dressing/Undressing Upper body dressing   What is the patient wearing?: Pull over shirt    Upper body assist Assist Level: Moderate Assistance - Patient 50 - 74%    Lower Body Dressing/Undressing Lower body dressing      What is the patient wearing?: Incontinence brief     Lower body assist Assist for lower body dressing: Dependent - Patient 0%     Toileting Toileting    Toileting assist Assist for toileting: 2 Helpers     Transfers Chair/bed transfer  Transfers assist     Chair/bed transfer assist level: Moderate Assistance - Patient 50 - 74%     Locomotion Ambulation   Ambulation assist   Ambulation activity did not occur: Safety/medical concerns          Walk 10 feet activity   Assist  Walk 10 feet activity did not occur: Safety/medical concerns        Walk 50 feet activity   Assist Walk 50 feet with 2 turns activity did not occur: Safety/medical concerns         Walk 150 feet activity   Assist Walk 150 feet activity did not occur: Safety/medical concerns         Walk 10 feet on uneven surface  activity   Assist Walk 10 feet on uneven surfaces activity did not occur: Safety/medical concerns  Wheelchair     Assist Will patient use wheelchair at discharge?: Yes Type of Wheelchair: Manual    Wheelchair assist level: Moderate Assistance - Patient 50 - 74% Max wheelchair distance: 150'    Wheelchair 50 feet with 2 turns activity    Assist        Assist Level: Moderate Assistance - Patient 50 - 74%   Wheelchair 150 feet activity     Assist Wheelchair 150 feet activity did not occur: Safety/medical concerns   Assist Level: Moderate Assistance - Patient 50 - 74%    Medical Problem List and Plan: 1.   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 Aspirin and Plavix therapy 3 weeks then aspirin alone poor motivation  Continue CIR 2.  DVT Prophylaxis/Anticoagulation: subcutaneous Lovenox. Monitor for any bleeding episodes, thrombocytopenia improving 3. Pain Management:  Tramadol as needed, added voltaren gel and Knee orthosis- improved pain,  mainly medial joint line  Changed tramadol to 50mg  q6 prn with zanaflex at noc 4. Mood: Ativan DC'd, Klonopin 0.25 3 times daily 5. Neuropsych: This patient is?  Fully capable of making decisions onhis own behalf. 6. Skin/Wound Care: Routine skin checks 7. Fluids/Electrolytes/Nutrition:  Routine in and out's  8. Hypertension. Norvasc 10 mg daily. Monitor with increased mobility  Labile, but appears to be gradually improving, will continue to monitor and consider medication adjustments tomorrow if necessary 9. Hyperlipidemia. Lipitor 10. History of tobacco use. Provide counseling  11.  Borderline normocytic anemia:   Hemoglobin 12.9 on 12/16  Continue to monitor 12.  Hematuria:?  Resolved 13.  AKI  Creatinine 1.61 on 12/16  Continue to monitor 14.  Urinary frequency  PVRs ordered  LOS: 8 days A FACE TO FACE EVALUATION WAS PERFORMED  Ankit Karis Jubanil Patel 02/05/2018, 9:51 AM

## 2018-02-06 ENCOUNTER — Inpatient Hospital Stay (HOSPITAL_COMMUNITY): Payer: Medicare Other | Admitting: Physical Therapy

## 2018-02-06 ENCOUNTER — Inpatient Hospital Stay (HOSPITAL_COMMUNITY): Payer: Medicare Other

## 2018-02-06 MED ORDER — HYDROCHLOROTHIAZIDE 12.5 MG PO CAPS
12.5000 mg | ORAL_CAPSULE | Freq: Every day | ORAL | Status: DC
  Administered 2018-02-06 – 2018-02-11 (×6): 12.5 mg via ORAL
  Filled 2018-02-06 (×6): qty 1

## 2018-02-06 MED ORDER — CITALOPRAM HYDROBROMIDE 10 MG PO TABS
10.0000 mg | ORAL_TABLET | Freq: Every day | ORAL | Status: DC
  Administered 2018-02-06 – 2018-02-19 (×14): 10 mg via ORAL
  Filled 2018-02-06 (×14): qty 1

## 2018-02-06 NOTE — Progress Notes (Signed)
Occupational Therapy Session Note  Patient Details  Name: Wayne Griffin MRN: [id number] Date of Birth: 03-12-1945  Today's Date: 02/06/2018 OT Individual Time: 0700-0830 OT Individual Time Calculation (min): 90 min    Short Term Goals: Week 1:  OT Short Term Goal 1 (Week 1): Pt will bathe LB with mod A. OT Short Term Goal 1 - Progress (Week 1): Met OT Short Term Goal 2 (Week 1): Pt will don shirt with mod A. OT Short Term Goal 2 - Progress (Week 1): Met OT Short Term Goal 3 (Week 1): Pt will don pants with max A. OT Short Term Goal 3 - Progress (Week 1): Met OT Short Term Goal 4 (Week 1): Pt will transfer to toilet with mod-max A of 1. OT Short Term Goal 4 - Progress (Week 1): Progressing toward goal OT Short Term Goal 5 (Week 1): Pt will perform self ROM to LUE with min cues.   OT Short Term Goal 5 - Progress (Week 1): Progressing toward goal  Skilled Therapeutic Interventions/Progress Updates:    1:1. Pt with pain in shoulder by end of session, and RN alerted to deliver medication. Rest provided PRN/proper positioning for pain relief. Pt completes supine>sitting with A for trunk elevation to come to EOB for eating/UB dressing. Intermittent CGA-MIN A provided for sitting balance d/t fatigue with 1 supine rest break. Pt completes UB dressing with min A to elevate LUE pushing sleeve from wrist to armpit with mod VC for hemi technqiue. OT dons teds/non skid socks and pants. Pt in frustrated mood this morning requiring increased time to problem solve functional mobility. Pt transfer eob<>w/c with min-mod A R/L via squat pivot. OT applies K tape to extensors of LUE for edema management. Pt completes 1x20 arm skate for NMR with min-mod shoulder activation  Flex/ext, horizontal ab/adduct, int/ext rotation and elbow flex/ext with scapular tactile input and min-mod A for reaching full ROM gliding across table. Exited session with pt seated in bed, call light in reach and all needs met  Therapy  Documentation Precautions:  Precautions Precautions: Fall Precaution Comments: L hemi Restrictions Weight Bearing Restrictions: No General:   Vital Signs: Therapy Vitals Temp: 97.9 F (36.6 C) Pulse Rate: 62 Resp: 20 BP: (!) 159/73 Patient Position (if appropriate): Lying Oxygen Therapy SpO2: 97 % O2 Device: Room Air Pain:     Therapy/Group: Individual Therapy  Tonny Branch 02/06/2018, 8:26 AM

## 2018-02-06 NOTE — Progress Notes (Signed)
[place] PHYSICAL MEDICINE & REHABILITATION PROGRESS NOTE   Subjective/Complaints: .Left shouldr and knee pain improved today  ROS: slept well .  Denies CP, SOB, N/V/D  Objective:   No results found. Recent Labs    02/04/18 0536  WBC 6.3  HGB 12.9*  HCT 38.9*  PLT 138*   Recent Labs    02/04/18 0536  CREATININE 1.61*    Intake/Output Summary (Last 24 hours) at 02/06/2018 0915 Last data filed at 02/06/2018 0556 Gross per 24 hour  Intake 480 ml  Output 375 ml  Net 105 ml     Physical Exam: Vital Signs Blood pressure (!) 159/73, pulse 62, temperature 97.9 F (36.6 C), resp. rate 20, height _0  (1.803 m), weight 108.2 kg, SpO2 97 %. Constitutional: No distress . Vital signs reviewed. HENT: Normocephalic.  Atraumatic. Eyes: EOMI. No discharge. Cardiovascular: RRR.  No JVD. Respiratory: CTA bilaterally.  Normal effort.  + Nasal cannula, but set to 0L GI: BS +. Non-distended. Musc: No edema or tenderness in extremities. Neurologic: Alert. Motor: 5/5 in RUE/RLE, unchanged 0/5 left upper extremity proximal distal, unchanged TOne MAS 3 L pectoralis Left lower extremity: Hip flexion, knee extension 3-/5, ankle dorsiflexion 1/5 Skin: No evidence of breakdown, no evidence of rash Psych: Affect anxious  Assessment/Plan: 1. Functional deficits secondary to Left hemiplegia Right subcortical infarct which require 3+ hours per day of interdisciplinary therapy in a comprehensive inpatient rehab setting.  Physiatrist is providing close team supervision and 24 hour management of active medical problems listed below.  Physiatrist and rehab team continue to assess barriers to discharge/monitor patient progress toward functional and medical goals  Care Tool:  Bathing    Body parts bathed by patient: Left arm, Chest, Abdomen, Front perineal area, Right upper leg, Left upper leg   Body parts bathed by helper: Right arm, Buttocks, Right lower leg     Bathing assist  Assist Level: Moderate Assistance - Patient 50 - 74%     Upper Body Dressing/Undressing Upper body dressing   What is the patient wearing?: Pull over shirt    Upper body assist Assist Level: Moderate Assistance - Patient 50 - 74%    Lower Body Dressing/Undressing Lower body dressing      What is the patient wearing?: Incontinence brief     Lower body assist Assist for lower body dressing: Dependent - Patient 0%     Toileting Toileting    Toileting assist Assist for toileting: 2 Helpers     Transfers Chair/bed transfer  Transfers assist     Chair/bed transfer assist level: Moderate Assistance - Patient 50 - 74%     Locomotion Ambulation   Ambulation assist   Ambulation activity did not occur: Safety/medical concerns  Assist level: 2 helpers Assistive device: Other (comment)(rail ) Max distance: 5 ft   Walk 10 feet activity   Assist  Walk 10 feet activity did not occur: Safety/medical concerns        Walk 50 feet activity   Assist Walk 50 feet with 2 turns activity did not occur: Safety/medical concerns         Walk 150 feet activity   Assist Walk 150 feet activity did not occur: Safety/medical concerns         Walk 10 feet on uneven surface  activity   Assist Walk 10 feet on uneven surfaces activity did not occur: Safety/medical concerns         Wheelchair     Assist Will patient use wheelchair  at discharge?: Yes Type of Wheelchair: Manual    Wheelchair assist level: Moderate Assistance - Patient 50 - 74% Max wheelchair distance: 150'    Wheelchair 50 feet with 2 turns activity    Assist        Assist Level: Moderate Assistance - Patient 50 - 74%   Wheelchair 150 feet activity     Assist Wheelchair 150 feet activity did not occur: Safety/medical concerns   Assist Level: Moderate Assistance - Patient 50 - 74%    Medical Problem List and Plan: 1.  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 Aspirin and Plavix therapy 3 weeks then aspirin alone poor motivation  Team conference today please see physician documentation under team conference tab, met with team face-to-face to discuss problems,progress, and goals. Formulized individual treatment plan based on medical history, underlying problem and comorbidities. 2.  DVT Prophylaxis/Anticoagulation: subcutaneous Lovenox. Monitor for any bleeding episodes, thrombocytopenia improving 3. Pain Management:  Tramadol as needed, added voltaren gel and Knee orthosis- improved pain,  mainly medial joint line  Changed tramadol to 5m q6 prn with zanaflex at noc 4. Mood: Ativan DC'd, Klonopin 0.25 3 times daily 5. Neuropsych: This patient is?  Fully capable of making decisions onhis own behalf. 6. Skin/Wound Care: Routine skin checks 7. Fluids/Electrolytes/Nutrition:  Routine in and out's  8. Hypertension. Norvasc 10 mg daily. Monitor with increased mobility   Vitals:   02/05/18 2051 02/06/18 0547  BP: (!) 154/106 (!) 159/73  Pulse: 66 62  Resp: 17 20  Temp: 97.9 F (36.6 C) 97.9 F (36.6 C)  SpO2: 992%995% diastolic fluctuating, pt is about 2 wks post CVA will tighten control add HCTZ 9. Hyperlipidemia. Lipitor 10. History of tobacco use. Provide counseling  11.  Borderline normocytic anemia:   Hemoglobin 12.9 on 12/16  Continue to monitor 12.  Hematuria:?  Resolved 13.  AKI  Creatinine 1.61 on 12/16  Continue to monitor 14.  Urinary frequency  PVRs ordered  LOS: 9 days A FACE TO FACE EVALUATION WAS PERFORMED  ACharlett Blake12/18/2019, 9:15 AM

## 2018-02-06 NOTE — Patient Care Conference (Signed)
Inpatient RehabilitationTeam Conference and Plan of Care Update Date: 02/06/2018   Time: 11:15 AM    Patient Name: Wayne Griffin      Medical Record Number: [id number]  Date of Birth: Jun 28, 1945 Sex: Male         Room/Bed: 4W19C/4W19C-01 Payor Info: Payor: MEDICARE / Plan: MEDICARE PART A AND B / Product Type: *No Product type* /    Admitting Diagnosis: R CVA  Admit Date/Time:  01/28/2018  2:56 PM Admission Comments: No comment available   Primary Diagnosis:  <principal problem not specified> Principal Problem: <principal problem not specified>  Patient Active Problem List   Diagnosis Date Noted  . AKI (acute kidney injury) (HCC)   . Acute blood loss anemia   . Labile blood pressure   . Essential hypertension 01/28/2018  . Hyperlipidemia 01/28/2018  . Left leg pain 01/28/2018  . Obesity 01/28/2018  . 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 Small vessel disease (HCC) 01/28/2018  . Anxiety   . Cerebrovascular accident (CVA) (HCC)   . History of tobacco use   . Dyslipidemia   . Hemiparesis affecting left side as late effect of stroke (HCC)   . Acute CVA (cerebrovascular accident) [facility]) s/p IV tPA 01/24/2018    Expected Discharge Date: Expected Discharge Date: 02/19/18  Team Members Present: Physician leading conference: Dr. Claudette Laws Social Worker Present: Dossie Der, LCSW Nurse Present: Doran Durand, LPN PT Present: Woodfin Ganja, PT OT Present: Roney Mans, OT;Ardis Rowan, COTA SLP Present: Feliberto Gottron, SLP PPS Coordinator present : Fae Pippin     Current Status/Progress Goal Weekly Team Focus  Medical   left hemiparesis unchanged , labile BPs  reduce fall risk, stabilize BP  improved motivation   Bowel/Bladder   mostyly continent with periods of incontinence. LBM 12/16  Pt will have fewer episode of incontinence and maintain regular bowel program  assess bowle and bladder needs qshift and PRN   Swallow/Nutrition/ Hydration         ADL's   max/mod A overall; decreased activity tolerance,  min - supervision overall  BADL retraining, functional trasnfers, LUE NMR, safety awareness, activity tolerance   Mobility   mod assist bed mobility, mod-max assist transfers, max +2 for gait 5 ft at rail  min assist   OOB tolerance, pain management, endurance, L NMR, gait, standing , balance   Communication             Safety/Cognition/ Behavioral Observations            Pain   Pain under control with volteren gel, and PRN tramadol  pain less than 2  assess pain qshift and PRN   Skin   abrasion to right foot. skin tear to left arm  promote proper wound healing. maintain skin integrity  assess skin qshift and PRN      *See Care Plan and progress notes for long and short-term goals.     Barriers to Discharge  Current Status/Progress Possible Resolutions Date Resolved   Physician    Medical stability     knee pain improved , some shoulder pain   may need additional spasticity management for Left shoulder pain      Nursing                  PT  Inaccessible home environment;Decreased caregiver support;Home environment access/layout;Behavior                 OT  SLP                SW                Discharge Planning/Teaching Needs:  Not sure if pt has 24 hr care at discharge-son can be there some but not 24 hr. May need to pursue alternative options      Team Discussion:  Progressing slowly in his therapies, fatigue and knee pain is limiting him in his therapies. Knee not buckling as bad in therapies. Speech discharged due to at goal level. Anxiety an issue needs to see neuro-psych while here. Dys 3 thin liquid diet. May need to go to a NH upon discharge due to does not have 24 hr care at discharge  Revisions to Treatment Plan:  DC 12/31    Continued Need for Acute Rehabilitation Level of Care: The patient requires daily medical management by a physician with specialized training in physical  medicine and rehabilitation for the following conditions: Daily direction of a multidisciplinary physical rehabilitation program to ensure safe treatment while eliciting the highest outcome that is of practical value to the patient.: Yes Daily medical management of patient stability for increased activity during participation in an intensive rehabilitation regime.: Yes Daily analysis of laboratory values and/or radiology reports with any subsequent need for medication adjustment of medical intervention for : Neurological problems;Blood pressure problems   I attest that I was present, lead the team conference, and concur with the assessment and plan of the team.   Wayne Griffin, Wayne Griffin 02/06/2018, 1:11 PM

## 2018-02-06 NOTE — Progress Notes (Signed)
Occupational Therapy Session Note  Patient Details  Name: Wayne HaberGary Monrreal MRN: [id number] Date of Birth: 1945-04-22  Today's Date: 02/06/2018 OT Individual Time: 1300-1400 OT Individual Time Calculation (min): 60 min    Short Term Goals: Week 2:  OT Short Term Goal 1 (Week 2): Pt will transfer to toilet with mod-max A of 1. OT Short Term Goal 2 (Week 2): Pt will perform self ROM to LUE with min cues.   OT Short Term Goal 3 (Week 2): Pt will complete bathing tasks with min A OT Short Term Goal 4 (Week 2): Pt will don pullover shirt with min A  Skilled Therapeutic Interventions/Progress Updates:    OT intervention with focus on BADL retraining, sitting balance, bed mobility, activity tolerance, and safety awareness to increase independence with BADLs.  Pt required mod A for supine>sit and min A for sit<>stand with Stedy.  Pt engaged in bathing at shower level and dressing with sit<>stand from EOB with Stedy.  Pt transferred to bathroom with Stedy.  Pt requires max multimodal cues and HOH assist for hemi dressing/bathing techniques.  Pt does not initiate use of LUE during functional tasks.  Pt returned to bed with max A for sit>supine.  Pt assisted with repositioning in bed.  Pt remained in bed with all needs within reach and NT present.   Therapy Documentation Precautions:  Precautions Precautions: Fall Precaution Comments: L hemi Restrictions Weight Bearing Restrictions: No Pain: Pain Assessment Pain Scale: 0-10 Pain Score: 0-No pain ADL: ADL Eating: Set up Where Assessed-Eating: Bed level Grooming: Minimal assistance Where Assessed-Grooming: Sitting at sink Upper Body Bathing: Minimal assistance Where Assessed-Upper Body Bathing: Shower Lower Body Bathing: Moderate assistance Where Assessed-Lower Body Bathing: Shower Upper Body Dressing: Moderate assistance Where Assessed-Upper Body Dressing: Edge of bed Lower Body Dressing: Maximal assistance Where Assessed-Lower Body  Dressing: Edge of bed, Other (Comment)(standing with Antony SalmonStedy) Walk-In Shower Transfer: Dependent Film/video editorWalk-In Shower Transfer Method: Other (comment)(Stedy) AstronomerWalk-In Shower Equipment: Shower seat with back   Therapy/Group: Individual Therapy  Rich BraveLanier, [name] 02/06/2018, 2:53 PM

## 2018-02-06 NOTE — Progress Notes (Signed)
Physical Therapy Session Note  Patient Details  Name: Wayne Griffin MRN: [id number] Date of Birth: 01-20-1946  Today's Date: 02/06/2018 PT Individual Time: 1130-1155 PT Individual Time Calculation (min): 25 min   Short Term Goals: Week 2:  PT Short Term Goal 1 (Week 2): Pt will perform bed<>chair transfer with mod assist PT Short Term Goal 2 (Week 2): Pt will ambulate x 10 ft max assist +1 PT Short Term Goal 3 (Week 2): Pt will perform sit<>stand consistently with mod assist.   Skilled Therapeutic Interventions/Progress Updates:    Patient received in bed, again attempting to decline therapy stating OT will be here soon but was reoriented to schedule (OT not coming until the afternoon) and then able to be convinced to participate in therapy. He declined OOB activities today and so session focused on L LE exercise/NMR with tactile stimulation/facilitation provided as appropriate but able to work through ankle pumps, quad sets and SAQs, and SLRs with L LE. MaxA/heavy facilitation for ankle pumps, all other exercises Min facilitation/MinA. Also performed tone reducing techniques to L LE due to L hamstring tone noted during functional exercises with moderate release of tone noted. He was repositioned in bed with assist of nurse tech and left in bed with HOB elevated and all needs otherwise met this morning, bed alarm active.   Therapy Documentation Precautions:  Precautions Precautions: Fall Precaution Comments: L hemi Restrictions Weight Bearing Restrictions: No General:   Vital Signs:  Pain: Pain Assessment Pain Scale: 0-10 Pain Score: 0-No pain     Therapy/Group: Individual Therapy  Deniece Ree PT, DPT, CBIS  Supplemental Physical Therapist [facility]    Pager [phone] Acute Rehab Office [phone]   02/06/2018, 12:28 PM

## 2018-02-06 NOTE — Progress Notes (Signed)
Physical Therapy Session Note  Patient Details  Name: Wayne Griffin MRN: [id number] Date of Birth: 10-22-1945  Today's Date: 02/06/2018 PT Individual Time: 1000-1045 PT Individual Time Calculation (min): 45 min   Short Term Goals: Week 1:  PT Short Term Goal 1 (Week 1): Pt will transfer bed<>chair, max assist +1 PT Short Term Goal 1 - Progress (Week 1): Met PT Short Term Goal 2 (Week 1): Pt will initiate gait training PT Short Term Goal 2 - Progress (Week 1): Met PT Short Term Goal 3 (Week 1): Pt will perform sit<>stand transfer max assist x1 PT Short Term Goal 3 - Progress (Week 1): Met PT Short Term Goal 4 (Week 1): Pt will maintain dynamic sitting balance w/ supervision PT Short Term Goal 4 - Progress (Week 1): Met  Skilled Therapeutic Interventions/Progress Updates:     Patient received in bed, initially trying to decline PT but able to be convinced to participate with Max encouragement. He required MaxA for supine to sit today, close min guard to maintain sitting balance, and was able to perform sit to stand with MaxA in stedy; transferred to [facility] with [place] in stedy and able to perform sit to stand in [employer] seat with MaxA. Transported to PT gym totalA in [facility], and from there worked on repeated sit to stands initially with MaxA but fading to [place] as well as standing weight shift to L LE with R cross-midline reach with MaxA for appropriate weight shift in [place]. Easily fatigued and required mutliple rest breaks. Able to self-propel WC using hemi technique approximately 71f before fatiguing, then [facility] for transfers in stedy with MaxA. ModA to return to supine and was able to scoot himself up in bed using bed rails and pulling/pushing with R LE/UE. He was left in bed with all needs met and bed alarm active.   Therapy Documentation Precautions:  Precautions Precautions: Fall Precaution Comments: L hemi Restrictions Weight Bearing  Restrictions: No G  Pain: Pain Assessment Pain Scale: 0-10 Pain Score: 0-No pain Faces Pain Scale: Hurts even more Pain Type: Acute pain Pain Location: Shoulder Pain Orientation: Left Pain Descriptors / Indicators: Aching Pain Frequency: Constant Pain Onset: Progressive Patients Stated Pain Goal: 0 Pain Intervention(s): Medication (See eMAR);Distraction    Therapy/Group: Individual Therapy  KDeniece ReePT, DPT, CBIS  Supplemental Physical Therapist [facility]   Pager [phone] Rehab Office [phone]  02/06/2018, 11:07 AM

## 2018-02-07 ENCOUNTER — Inpatient Hospital Stay (HOSPITAL_COMMUNITY): Payer: Medicare Other | Admitting: Physical Therapy

## 2018-02-07 ENCOUNTER — Inpatient Hospital Stay (HOSPITAL_COMMUNITY): Payer: Medicare Other | Admitting: *Deleted

## 2018-02-07 NOTE — Progress Notes (Signed)
Occupational Therapy Session Note  Patient Details  Name: Wayne Griffin MRN: [id number] Date of Birth: [birth year]  Today's Date: 02/07/2018 OT Individual Time: 1325-1420 OT Individual Time Calculation (min): 55 min    Short Term Goals: Week 2:  OT Short Term Goal 1 (Week 2): Pt will transfer to toilet with mod-max A of 1. OT Short Term Goal 2 (Week 2): Pt will perform self ROM to LUE with min cues.   OT Short Term Goal 3 (Week 2): Pt will complete bathing tasks with min A OT Short Term Goal 4 (Week 2): Pt will don pullover shirt with min A Week 5:     Skilled Therapeutic Interventions/Progress Updates: Patient sleepy but participated as follows before falling asleep sitting uprigt on edge of bed Supine to edge of bed transfer=moderate assistance and cues for technique Edge of bed he partiicpated trunk rotations and scapular mobilization  Patient had good static balance; however he began to fall aspleep edge of the bed  Patient left lying in bed with call bell within reach     Therapy Documentation Precautions:  Precautions Precautions: Fall Precaution Comments: L hemi Restrictions Weight Bearing Restrictions: No General: General OT Amount of Missed Time: 5 Minutes(5) Pain:described area on the frontal shoulder.   Did not rate the pain and stated he would wait for scheduled medication   Therapy/Group: Individual Therapy  [name], Carlo Guevarra [facility] CenterYeary 02/07/2018, 10:31 PM

## 2018-02-07 NOTE — Progress Notes (Signed)
Physical Therapy Session Note  Patient Details  Name: Ernest HaberGary Grosz MRN: [id number] Date of Birth: [birth year]  Today's Date: 02/07/2018 PT Individual Time: 1505-1520 PT Individual Time Calculation (min): 15 min   Short Term Goals: Week 2:  PT Short Term Goal 1 (Week 2): Pt will perform bed<>chair transfer with mod assist PT Short Term Goal 2 (Week 2): Pt will ambulate x 10 ft max assist +1 PT Short Term Goal 3 (Week 2): Pt will perform sit<>stand consistently with mod assist.   Skilled Therapeutic Interventions/Progress Updates:   Made 2 attempts for today's session. Pt refused 1st attempt and agreeable to try again in afternoon. Returned in afternoon, pt continued to refuse. Provided multiple options for activity, but continued to refuse. Pt appeared very depressed about his recovery, provided encouragement and therapeutic listening. Pt declined further activity and agreed to participate tomorrow. Ended session in supine, all needs in reach.   Therapy Documentation Precautions:  Precautions Precautions: Fall Precaution Comments: L hemi Restrictions Weight Bearing Restrictions: No Pain: Pain Assessment Pain Scale: 0-10 Pain Score: 6  Pain Type: Acute pain Pain Location: Shoulder Pain Orientation: Left Pain Descriptors / Indicators: Aching Pain Frequency: Constant Pain Onset: On-going Pain Intervention(s): Medication (See eMAR)  Therapy/Group: Individual Therapy  Ivanka Kirshner Melton KrebsK Samreen Seltzer 02/07/2018, 3:33 PM

## 2018-02-07 NOTE — Progress Notes (Signed)
[place] PHYSICAL MEDICINE & REHABILITATION PROGRESS NOTE   Subjective/Complaints: Pt states he had an episode yesterday pm when he had difficulty with speech.  THis lasted about 1 hour.  States he could not explain this to his nurse, he states he was very anxious last noc  ROS: slept well .  Denies CP, SOB, N/V/D  Objective:   No results found. No results for input(s): WBC, HGB, HCT, PLT in the last 72 hours. No results for input(s): NA, K, CL, CO2, GLUCOSE, BUN, CREATININE, CALCIUM in the last 72 hours.  Intake/Output Summary (Last 24 hours) at 02/07/2018 0747 Last data filed at 02/07/2018 0706 Gross per 24 hour  Intake 720 ml  Output 1617 ml  Net -897 ml     Physical Exam: Vital Signs Blood pressure (!) 149/79, pulse 63, temperature 98.1 F (36.7 C), temperature source Oral, resp. rate 18, height 5\' 11"  (1.803 m), weight 108.2 kg, SpO2 97 %. Constitutional: No distress . Vital signs reviewed. HENT: Normocephalic.  Atraumatic. Eyes: EOMI. No discharge. Cardiovascular: RRR.  No JVD. Respiratory: CTA bilaterally.  Normal effort.  + Nasal cannula, but set to 0L GI: BS +. Non-distended. Musc: No edema or tenderness in extremities. Neurologic: Alert. Motor: 5/5 in RUE/RLE, unchanged 0/5 left upper extremity proximal distal, unchanged TOne MAS 3 L pectoralis Left lower extremity: Hip flexion, knee extension 3-/5, ankle dorsiflexion 1/5 Skin: No evidence of breakdown, no evidence of rash Psych: Affect anxious  Assessment/Plan: 1. Functional deficits secondary to Left hemiplegia Right subcortical infarct which require 3+ hours per day of interdisciplinary therapy in a comprehensive inpatient rehab setting.  Physiatrist is providing close team supervision and 24 hour management of active medical problems listed below.  Physiatrist and rehab team continue to assess barriers to discharge/monitor patient progress toward functional and medical goals  Care Tool:  Bathing     Body parts bathed by patient: Left arm, Chest, Abdomen, Front perineal area, Right upper leg, Left upper leg, Face   Body parts bathed by helper: Right arm, Buttocks, Right lower leg, Left lower leg     Bathing assist Assist Level: Moderate Assistance - Patient 50 - 74%     Upper Body Dressing/Undressing Upper body dressing   What is the patient wearing?: Pull over shirt    Upper body assist Assist Level: Moderate Assistance - Patient 50 - 74%    Lower Body Dressing/Undressing Lower body dressing      What is the patient wearing?: Incontinence brief, Pants     Lower body assist Assist for lower body dressing: Maximal Assistance - Patient 25 - 49%     Toileting Toileting    Toileting assist Assist for toileting: 2 Helpers     Transfers Chair/bed transfer  Transfers assist     Chair/bed transfer assist level: Dependent - mechanical lift(stedy )     Locomotion Ambulation   Ambulation assist   Ambulation activity did not occur: Safety/medical concerns  Assist level: 2 helpers Assistive device: Other (comment)(rail ) Max distance: 5 ft   Walk 10 feet activity   Assist  Walk 10 feet activity did not occur: Safety/medical concerns        Walk 50 feet activity   Assist Walk 50 feet with 2 turns activity did not occur: Safety/medical concerns         Walk 150 feet activity   Assist Walk 150 feet activity did not occur: Safety/medical concerns         Walk 10 feet on  uneven surface  activity   Assist Walk 10 feet on uneven surfaces activity did not occur: Safety/medical concerns         Wheelchair     Assist Will patient use wheelchair at discharge?: Yes Type of Wheelchair: Manual    Wheelchair assist level: Supervision/Verbal cueing Max wheelchair distance: 50    Wheelchair 50 feet with 2 turns activity    Assist        Assist Level: Supervision/Verbal cueing   Wheelchair 150 feet activity     Assist  Wheelchair 150 feet activity did not occur: Safety/medical concerns   Assist Level: Moderate Assistance - Patient 50 - 74%    Medical Problem List and Plan: 1.  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 Aspirin and Plavix therapy 3 weeks then aspirin alone, ? If speech difficulties yesterday were a TIA  CIR PT, OT ELOS 12/31 2.  DVT Prophylaxis/Anticoagulation: subcutaneous Lovenox. Monitor for any bleeding episodes, thrombocytopenia improving 3. Pain Management:  Tramadol as needed, added voltaren gel and Knee orthosis- improved pain,  mainly medial joint line  Changed tramadol to 50mg  q6 prn with zanaflex at noc 4. Mood: Ativan DC'd, Klonopin 0.25 3 times daily 5. Neuropsych: This patient is?  Fully capable of making decisions onhis own behalf. 6. Skin/Wound Care: Routine skin checks 7. Fluids/Electrolytes/Nutrition:  Routine in and out's  8. Hypertension. Norvasc 10 mg daily. Monitor with increased mobility   Vitals:   02/06/18 2009 02/07/18 0644  BP: (!) 157/88 (!) 149/79  Pulse: 80 63  Resp: 18 18  Temp: 97.9 F (36.6 C) 98.1 F (36.7 C)  SpO2: 96% 97%  diastolic fluctuating, pt is about 2 wks post CVA, started HCTZ 12/18.  9. Hyperlipidemia. Lipitor 10. History of tobacco use. Provide counseling  11.  Borderline normocytic anemia:   Hemoglobin 12.9 on 12/16  Continue to monitor 12.  Hematuria:?  Resolved 13.  AKI  Creatinine 1.61 on 12/16  Continue to monitor 14.  Urinary frequency  PVRs ordered  LOS: 10 days A FACE TO FACE EVALUATION WAS PERFORMED  Erick Colacendrew E Kirsteins 02/07/2018, 7:47 AM

## 2018-02-07 NOTE — Progress Notes (Signed)
Occupational Therapy Session Note  Patient Details  Name: Wayne Griffin MRN: [id number] Date of Birth: 03/22/1945  Today's Date: 02/07/2018 OT Individual Time: 0900-1000 OT Individual Time Calculation (min): 60 min    Short Term Goals: Week 2:  OT Short Term Goal 1 (Week 2): Pt will transfer to toilet with mod-max A of 1. OT Short Term Goal 2 (Week 2): Pt will perform self ROM to LUE with min cues.   OT Short Term Goal 3 (Week 2): Pt will complete bathing tasks with min A OT Short Term Goal 4 (Week 2): Pt will don pullover shirt with min A  Skilled Therapeutic Interventions/Progress Updates:    Pt resting in bed upon arrival.  Pt somewhat lethargic this morning stating he didn't sleep previous night.  Pt agreeable to participating in bathing/dressing at EOB with sit<>stand with Stedy.  Pt fatigues quickly and required max A for sitting balance as therapy session continued.  Pt required mod A for sit<>stand from EOB with Stedy to complete LB bathing/dressing tasks.  Pt initiated hemi dressing techniques but required assistance to complete threading onto LUE.  Pt required mod A for supine>sit EOB and max A for sit>supine.  Pt requires more than a reasonable amount of time to complete tasks with multiple rest breaks.  Pt remained in bed with all needs within reach and bed alarm activated.   Therapy Documentation Precautions:  Precautions Precautions: Fall Precaution Comments: L hemi Restrictions Weight Bearing Restrictions: No Pain: Pain Assessment Pain Scale: 0-10 Pain Score: 2  Pain Type: Acute pain Pain Location: Shoulder Pain Orientation: Left Pain Descriptors / Indicators: Aching Pain Frequency: Constant Pain Onset: On-going Pain Intervention(s): Medication (See eMAR)   Therapy/Group: Individual Therapy  Rich BraveLanier, [name] 02/07/2018, 2:49 PM

## 2018-02-07 NOTE — Progress Notes (Signed)
Social Work Patient ID: Wayne Griffin, male   DOB: Jun 21, 1945, 73 y.o.   MRN: [id number] Met with pt to discuss team conference goals min assist level and the  need for physical assist at discharge. Talked about going to a SNF in [place] then going home once able to stay alone. Have called son and will await return call to discuss discharge plans.

## 2018-02-08 ENCOUNTER — Inpatient Hospital Stay (HOSPITAL_COMMUNITY): Payer: Medicare Other

## 2018-02-08 ENCOUNTER — Inpatient Hospital Stay (HOSPITAL_COMMUNITY): Payer: Medicare Other | Admitting: Physical Therapy

## 2018-02-08 LAB — GLUCOSE, CAPILLARY: Glucose-Capillary: 140 mg/dL — ABNORMAL HIGH (ref 70–99)

## 2018-02-08 NOTE — Progress Notes (Signed)
[place] PHYSICAL MEDICINE & REHABILITATION PROGRESS NOTE   Subjective/Complaints: No new issues overnight.  Some periods of anxiety.  No knee pain complaints  ROS:  Denies CP, SOB, N/V/D  Objective:   No results found. No results for input(s): WBC, HGB, HCT, PLT in the last 72 hours. No results for input(s): NA, K, CL, CO2, GLUCOSE, BUN, CREATININE, CALCIUM in the last 72 hours.  Intake/Output Summary (Last 24 hours) at 02/08/2018 1241 Last data filed at 02/08/2018 1200 Gross per 24 hour  Intake 420 ml  Output 675 ml  Net -255 ml     Physical Exam: Vital Signs Blood pressure (!) 132/106, pulse 67, temperature 97.7 F (36.5 C), temperature source Oral, resp. rate 12, height 5\' 11"  (1.803 m), weight 108.2 kg, SpO2 98 %. Constitutional: No distress . Vital signs reviewed. HENT: Normocephalic.  Atraumatic. Eyes: EOMI. No discharge. Cardiovascular: RRR.  No JVD. Respiratory: CTA bilaterally.  Normal effort.  +  GI: BS +. Non-distended. Musc: No edema or tenderness in extremities. Neurologic: Alert. Motor: 5/5 in RUE/RLE, unchanged 0/5 left upper extremity proximal distal, unchanged TOne MAS 3 L pectoralis Left lower extremity: Hip flexion, knee extension 3-/5, ankle dorsiflexion 1/5 Skin: No evidence of breakdown, no evidence of rash Psych: Affect anxious  Assessment/Plan: 1. Functional deficits secondary to Left hemiplegia Right subcortical infarct which require 3+ hours per day of interdisciplinary therapy in a comprehensive inpatient rehab setting.  Physiatrist is providing close team supervision and 24 hour management of active medical problems listed below.  Physiatrist and rehab team continue to assess barriers to discharge/monitor patient progress toward functional and medical goals  Care Tool:  Bathing    Body parts bathed by patient: Left arm, Chest, Abdomen, Front perineal area, Right upper leg, Left upper leg, Face   Body parts bathed by helper: Right  arm, Buttocks, Right lower leg, Left lower leg     Bathing assist Assist Level: Moderate Assistance - Patient 50 - 74%     Upper Body Dressing/Undressing Upper body dressing   What is the patient wearing?: Pull over shirt    Upper body assist Assist Level: Moderate Assistance - Patient 50 - 74%    Lower Body Dressing/Undressing Lower body dressing      What is the patient wearing?: Incontinence brief, Pants     Lower body assist Assist for lower body dressing: Maximal Assistance - Patient 25 - 49%     Toileting Toileting    Toileting assist Assist for toileting: 2 Helpers     Transfers Chair/bed transfer  Transfers assist     Chair/bed transfer assist level: Moderate Assistance - Patient 50 - 74%     Locomotion Ambulation   Ambulation assist   Ambulation activity did not occur: Safety/medical concerns  Assist level: 2 helpers Assistive device: Other (comment)(rail ) Max distance: 5 ft   Walk 10 feet activity   Assist  Walk 10 feet activity did not occur: Safety/medical concerns        Walk 50 feet activity   Assist Walk 50 feet with 2 turns activity did not occur: Safety/medical concerns         Walk 150 feet activity   Assist Walk 150 feet activity did not occur: Safety/medical concerns         Walk 10 feet on uneven surface  activity   Assist Walk 10 feet on uneven surfaces activity did not occur: Safety/medical concerns         Wheelchair  Assist Will patient use wheelchair at discharge?: Yes Type of Wheelchair: Manual    Wheelchair assist level: Supervision/Verbal cueing Max wheelchair distance: 50    Wheelchair 50 feet with 2 turns activity    Assist        Assist Level: Supervision/Verbal cueing   Wheelchair 150 feet activity     Assist Wheelchair 150 feet activity did not occur: Safety/medical concerns   Assist Level: Moderate Assistance - Patient 50 - 74%    Medical Problem List and  Plan: 1.  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 Aspirin and Plavix therapy 3 weeks then aspirin alone, ? If speech difficulties yesterday were a TIA  CIR PT, OT ELOS 12/31 2.  DVT Prophylaxis/Anticoagulation: subcutaneous Lovenox. Monitor for any bleeding episodes, thrombocytopenia improving 3. Pain Management:  Tramadol as needed, added voltaren gel and Knee orthosis- improved pain,  mainly medial joint line  Changed tramadol to 50mg  q6 prn with zanaflex at noc 4. Mood: Ativan DC'd, Klonopin 0.25 3 times daily, remains very anxious nursing gets good results with nasal cannula without oxygen 5. Neuropsych: This patient is?  Fully capable of making decisions onhis own behalf. 6. Skin/Wound Care: Routine skin checks 7. Fluids/Electrolytes/Nutrition:  Routine in and out's  8. Hypertension. Norvasc 10 mg daily. Monitor with increased mobility   Vitals:   02/07/18 2133 02/08/18 0527  BP: 135/74 (!) 132/106  Pulse: 70 67  Resp: 14 12  Temp: 97.6 F (36.4 C) 97.7 F (36.5 C)  SpO2: 98% 98%  diastolic fluctuating, pt is about 2 wks post CVA, started HCTZ 12/18.  We will continue to monitor prior to any further dosage changes 9. Hyperlipidemia. Lipitor 10. History of tobacco use. Provide counseling  11.  Borderline normocytic anemia:   Hemoglobin 12.9 on 12/16  Continue to monitor, no obvious source of blood loss 12.  Hematuria:?  Resolved 13.  AKI  Creatinine 1.61 on 12/16  Continue to monitor 14.  Urinary frequency  PVRs ordered  LOS: 11 days A FACE TO FACE EVALUATION WAS PERFORMED  Erick Colacendrew E  02/08/2018, 12:41 PM

## 2018-02-08 NOTE — Progress Notes (Signed)
Occupational Therapy Session Note  Patient Details  Name: Wayne HaberGary Dowdle MRN: [id number] Date of Birth: 03/01/1945  Today's Date: 02/08/2018 OT Individual Time: 1300-1346 OT Individual Time Calculation (min): 46 min   Missed 14 min skilled OT d/t pt fatigue  Short Term Goals: Week 2:  OT Short Term Goal 1 (Week 2): Pt will transfer to toilet with mod-max A of 1. OT Short Term Goal 2 (Week 2): Pt will perform self ROM to LUE with min cues.   OT Short Term Goal 3 (Week 2): Pt will complete bathing tasks with min A OT Short Term Goal 4 (Week 2): Pt will don pullover shirt with min A  Skilled Therapeutic Interventions/Progress Updates:    1:1. Pt initially refusing tx, however with max encouragement pt willing to get OOB and make a cup of coffee. Total A transport to ADL apartment where pt completes 3 sit to stand with mod A for lifting and up to MAX A for standig balance with L knee block and manual placement of LUE in WB position on counter. Pt requires VC for weight shifting to L towards +2 (present for safety) while reaching, upright posture and terminal hip extension. Exited session after pt propel w/c back to room using hemi technique and min A. Pt seated in bed, exit alarm on and call light in reach  Therapy Documentation Precautions:  Precautions Precautions: Fall Precaution Comments: L hemi Restrictions Weight Bearing Restrictions: No General: General OT Amount of Missed Time: 14 Minutes PT Missed Treatment Reason: Patient unwilling to participate Vital Signs:  Pain:   ADL: ADL Eating: Set up Where Assessed-Eating: Bed level Grooming: Minimal assistance Where Assessed-Grooming: Sitting at sink Upper Body Bathing: Minimal assistance Where Assessed-Upper Body Bathing: Shower Lower Body Bathing: Moderate assistance Where Assessed-Lower Body Bathing: Shower Upper Body Dressing: Moderate assistance Where Assessed-Upper Body Dressing: Edge of bed Lower Body Dressing:  Maximal assistance Where Assessed-Lower Body Dressing: Edge of bed, Other (Comment)(standing with Antony SalmonStedy) Toileting: Not assessed Toilet Transfer: Not assessed Film/video editorWalk-In Shower Transfer: Dependent Film/video editorWalk-In Shower Transfer Method: Other (comment)(Stedy) AstronomerWalk-In Shower Equipment: Information systems managerhower seat with back Vision   Perception    Praxis   Exercises:   Other Treatments:     Therapy/Group: Individual Therapy  Shon HaleStephanie M Jacquis Paxton 02/08/2018, 1:53 PM

## 2018-02-08 NOTE — Plan of Care (Signed)
LTGs downgraded 2/2 lack of progress. Lack of progress attributed to decreased tolerance to OOB activity, pt's lack of motivation, and ongoing L shoulder pain. Per discussion w/ treatment team, pt now on QD schedule as he cannot tolerate 3 hrs/day of therapy or 15/7 scheduling.

## 2018-02-08 NOTE — Progress Notes (Signed)
Physical Therapy Session Note  Patient Details  Name: Wayne Griffin MRN: [id number] Date of Birth: [birth year]  Today's Date: 02/08/2018 PT Individual Time: 1000-1045 (make up) and 1500-1600 PT Individual Time Calculation (min): 45 min (make up) and 60 min  Missed time: 15 minutes (refusal)    Short Term Goals: Week 2:  PT Short Term Goal 1 (Week 2): Pt will perform bed<>chair transfer with mod assist PT Short Term Goal 2 (Week 2): Pt will ambulate x 10 ft max assist +1 PT Short Term Goal 3 (Week 2): Pt will perform sit<>stand consistently with mod assist.   Skilled Therapeutic Interventions/Progress Updates:    Session 1: Pt seen for 45 minutes of make-up time this session. Pt supine in bed upon PT arrival, agreeable to therapy tx and reports pain 3/10 in L shoulder. Pt donned pants while supine, therapist looped LEs through and pt performed bridge to pull pants over hips. Pt performed L LE exercises while supine for neuro re-ed x 10 each: hip flexion, SAQ and bridges. Pt transferred to sitting EOB with mod assist. Pt performed squat pivot to w/c with mod assist and propelled w/c to dayroom. Pt propelled w/c around furniture to park at a table, therapist provided pt with coffee. Pt and therapist discussing benefits of therapy as pt has been unmotivated and refusing therapies lately. Pt reports he is staring to give up, therapist will refer to neuropsychologist. Pt transported back to room and left seated in w/c with needs in reach.    Session 2: Pt supine in bed upon PT arrival, agreeable to therapy tx and reports pain in L shoulder 4/10.  Pt transferred to sitting EOB with mod assist. Pt performed squat pivot to w/c with max assist and propelled w/c to dayroom x100 ft with supervision using R hemi technique. Pt used kinetron for reciprocal LE movement, strengthening and L LE NMR 2 x 3 min bouts on 30 cm/sec. Pt propelled w/c to gym x 150 ft with supervision, intermittent min assist for steering.  Pt performed x 3 sit<>stands at the rail this session with mod-max assist. Max encouragement to work on standing activities. In standing pt worked on midline, weightshifting and static standing balance without UE support, verbal cues for upright posture and therapist blocking L knee. In standing pt worked on pre-gait stepping forwards/back with each LE, therapist blcoking L LE during stance, therapist assisting with foot placement during L LE swing. Pt propelled to gym and performed slideboard transfer this session from w/c<>mat with mod assist, verbal cues for techniques/weightshifting. Pt performed sit<>stand withing stedy and then performed 2 x 5 sit<>stands from elevated stedy seat for strengthening and neuro re-ed. Pt requesting to go back to bed. Pt transported back to room and performed slideboard transfer to bed mod assist. Sit>supine mod assist, left with needs in reach. Pt missed 15 minutes of therapy this session secondary to unwilling to participate.    Therapy Documentation Precautions:  Precautions Precautions: Fall Precaution Comments: L hemi Restrictions Weight Bearing Restrictions: No   Therapy/Group: Individual Therapy  Cresenciano GenreEmily van Schagen, PT, DPT 02/08/2018, 7:18 AM

## 2018-02-08 NOTE — Progress Notes (Signed)
Physical Therapy Session Note  Patient Details  Name: Wayne Griffin MRN: [id number] Date of Birth: 1945-03-06  Today's Date: 02/08/2018 PT Individual Time: 1100-1110 PT Individual Time Calculation (min): 10 min  and Today's Date: 02/08/2018 PT Missed Time: 50 Minutes Missed Time Reason: Patient unwilling to participate  Short Term Goals: Week 2:  PT Short Term Goal 1 (Week 2): Pt will perform bed<>chair transfer with mod assist PT Short Term Goal 2 (Week 2): Pt will ambulate x 10 ft max assist +1 PT Short Term Goal 3 (Week 2): Pt will perform sit<>stand consistently with mod assist.   Skilled Therapeutic Interventions/Progress Updates:   Pt in w/c and declined participation in therapy. Offered multiple options, including laying supine on mat in gym to work on LE strength, pt continued to decline. Pt stated "I can't focus w/ my shoulder hurting and just get me back in bed". L shoulder pain 8/10, thumb's width subluxation felt. Stedy transfer to EOB w/ max assist x1 to stand in stedy. Assisted w/ voiding in urinal, set-up assist, ended session in supine and all needs in reach. Missed 50 min of skilled PT 2/2 refusal.   Therapy Documentation Precautions:  Precautions Precautions: Fall Precaution Comments: L hemi Restrictions Weight Bearing Restrictions: No  Therapy/Group: Individual Therapy  Briceyda Abdullah Melton KrebsK Damisha Wolff 02/08/2018, 11:27 AM

## 2018-02-09 NOTE — Plan of Care (Signed)
  Problem: RH BOWEL ELIMINATION Goal: RH STG MANAGE BOWEL W/MEDICATION W/ASSISTANCE Description STG Manage Bowel with Medication with mod Assistance.  Outcome: Progressing   Problem: RH BOWEL ELIMINATION Goal: RH STG MANAGE BOWEL WITH ASSISTANCE Description STG Manage Bowel with mod Assistance.  Outcome: Not Progressing; constipation enema given;

## 2018-02-09 NOTE — Progress Notes (Signed)
Ernest HaberGary Crook is a 72 y.o. male who was admitted for CIR following basal ganglia infarct with left-sided weakness and dysarthria    Subjective: No new complaints. No new problems. Slept well.   Objective: Vital signs in last 24 hours: Temp:  [97.5 F (36.4 C)-98 F (36.7 C)] 98 F (36.7 C) (12/21 0519) Pulse Rate:  [66-68] 68 (12/21 0519) Resp:  [16-18] 18 (12/21 0519) BP: (140-152)/(77-91) 152/91 (12/21 0519) SpO2:  [96 %-99 %] 96 % (12/21 0519) Weight change:  Last BM Date: 02/07/18  Intake/Output from previous day: 12/20 0701 - 12/21 0700 In: 240 [P.O.:240] Out: 400 [Urine:400] Last cbgs: CBG (last 3)  Recent Labs    02/08/18 1141  GLUCAP 140*   Lab Results  Component Value Date   HGBA1C 5.3 01/25/2018    Patient Vitals for the past 24 hrs:  BP Temp Temp src Pulse Resp SpO2  02/09/18 0519 (!) 152/91 98 F (36.7 C) Oral 68 18 96 %  02/08/18 2021 140/77 (!) 97.5 F (36.4 C) Oral 66 16 99 %    Physical Exam General: No apparent distress   HEENT: not dry Lungs: Normal effort. Lungs clear to auscultation, no crackles or wheezes. Cardiovascular: Regular rate and rhythm, no edema Abdomen: S/NT/ND; BS(+) Musculoskeletal:  unchanged Neurological: No new neurological deficits with dense left hemiparesis Wounds: N/A    Skin: clear  Mental state: Alert, oriented, cooperative    Lab Results: BMET    Component Value Date/Time   NA 138 01/29/2018 0634   K 4.2 01/29/2018 0634   CL 103 01/29/2018 0634   CO2 26 01/29/2018 0634   GLUCOSE 103 (H) 01/29/2018 0634   BUN 16 01/29/2018 0634   CREATININE 1.61 (H) 02/04/2018 0536   CALCIUM 8.6 (L) 01/29/2018 0634   GFRNONAA 42 (L) 02/04/2018 0536   GFRAA 49 (L) 02/04/2018 0536   CBC    Component Value Date/Time   WBC 6.3 02/04/2018 0536   RBC 4.66 02/04/2018 0536   HGB 12.9 (L) 02/04/2018 0536   HCT 38.9 (L) 02/04/2018 0536   PLT 138 (L) 02/04/2018 0536   MCV 83.5 02/04/2018 0536   MCH 27.7 02/04/2018 0536   MCHC 33.2 02/04/2018 0536   RDW 14.4 02/04/2018 0536   LYMPHSABS 1.6 01/29/2018 0634   MONOABS 0.6 01/29/2018 0634   EOSABS 0.5 01/29/2018 0634   BASOSABS 0.1 01/29/2018 0634    Medications: I have reviewed the patient's current medications.  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  Continue CIR with PT and OT DVT prophylaxis continue Lovenox Essential hypertension.  Continue amlodipine.  Monitor on present therapy.  HCTZ started 3 days ago Dyslipidemia continue atorvastatin    Length of stay, days: 12  Gordy SaversPeter F Kwiatkowski , MD 02/09/2018, 12:27 PM

## 2018-02-10 ENCOUNTER — Inpatient Hospital Stay (HOSPITAL_COMMUNITY): Payer: Medicare Other

## 2018-02-10 NOTE — Progress Notes (Signed)
Occupational Therapy Session Note  Patient Details  Name: Wayne Griffin MRN: [id number] Date of Birth: March 19, 1945  Today's Date: 02/10/2018 OT Individual Time: 1300-1330 OT Individual Time Calculation (min): 30 min    Short Term Goals: Week 2:  OT Short Term Goal 1 (Week 2): Pt will transfer to toilet with mod-max A of 1. OT Short Term Goal 2 (Week 2): Pt will perform self ROM to LUE with min cues.   OT Short Term Goal 3 (Week 2): Pt will complete bathing tasks with min A OT Short Term Goal 4 (Week 2): Pt will don pullover shirt with min A Week 3:     Skilled Therapeutic Interventions/Progress Updates:    OT intervention with focus on bed mobility, sitting balance, and activity tolerance to increase independence with BADLs.  Pt continues required max encouragement to participate.  Pt requires min A for sitting balance with max verbal cues to maintain sitting balance.  Pt required mod A for supine<>sit EOB. Pt declined changing clothing at this time.  Pt returned to supine with HOB elevated and remained in bed with all needs within reach and bed alarm activated.   Therapy Documentation Precautions:  Precautions Precautions: Fall Precaution Comments: L hemi Restrictions Weight Bearing Restrictions: No Pain:     Therapy/Group: Individual Therapy  Rich BraveLanier, Delma Villalva Chappell 02/10/2018, 2:30 PM

## 2018-02-10 NOTE — Progress Notes (Signed)
Wayne Griffin is a 72 y.o. male who is admitted for CIR with functional deficits following a basal ganglia infarct with left-sided weakness and dysarthria.    Subjective: No new complaints. No new problems.  Seems a bit more depressed this a.m.   Objective: Vital signs in last 24 hours: Temp:  [97.7 F (36.5 C)-98.2 F (36.8 C)] 98.1 F (36.7 C) (12/22 0352) Pulse Rate:  [62-67] 62 (12/22 0352) Resp:  [14-20] 14 (12/22 0352) BP: (120-139)/(71-80) 120/71 (12/22 0352) SpO2:  [96 %-98 %] 96 % (12/22 0352) Weight change:  Last BM Date: 02/09/18  Intake/Output from previous day: 12/21 0701 - 12/22 0700 In: 560 [P.O.:560] Out: 243 [Urine:243] Last cbgs: CBG (last 3)  Recent Labs    02/08/18 1141  GLUCAP 140*   Patient Vitals for the past 24 hrs:  BP Temp Temp src Pulse Resp SpO2  02/10/18 0352 120/71 98.1 F (36.7 C) Oral 62 14 96 %  02/09/18 2014 139/80 98.2 F (36.8 C) Oral 63 18 98 %  02/09/18 1249 133/71 97.7 F (36.5 C) - 67 20 97 %     Physical Exam General: No apparent distress;  seems mildly depressed HEENT: not dry Lungs: Normal effort. Lungs clear to auscultation, no crackles or wheezes. Cardiovascular: Regular rate and rhythm, no edema Abdomen: S/NT/ND; BS(+) Musculoskeletal:  unchanged Neurological: No new neurological deficits with unchanged dense left hemiparesis Extremities no edema Skin: clear no rash Mental state: Alert, oriented, cooperative, depressed mood    Lab Results: BMET    Component Value Date/Time   NA 138 01/29/2018 0634   K 4.2 01/29/2018 0634   CL 103 01/29/2018 0634   CO2 26 01/29/2018 0634   GLUCOSE 103 (H) 01/29/2018 0634   BUN 16 01/29/2018 0634   CREATININE 1.61 (H) 02/04/2018 0536   CALCIUM 8.6 (L) 01/29/2018 0634   GFRNONAA 42 (L) 02/04/2018 0536   GFRAA 49 (L) 02/04/2018 0536   CBC    Component Value Date/Time   WBC 6.3 02/04/2018 0536   RBC 4.66 02/04/2018 0536   HGB 12.9 (L) 02/04/2018 0536   HCT 38.9 (L)  02/04/2018 0536   PLT 138 (L) 02/04/2018 0536   MCV 83.5 02/04/2018 0536   MCH 27.7 02/04/2018 0536   MCHC 33.2 02/04/2018 0536   RDW 14.4 02/04/2018 0536   LYMPHSABS 1.6 01/29/2018 0634   MONOABS 0.6 01/29/2018 0634   EOSABS 0.5 01/29/2018 0634   BASOSABS 0.1 01/29/2018 0634     Medications: I have reviewed the patient's current medications.  Assessment/Plan:  Functional deficits secondary to left basal ganglia infarction with left hemiparesis.  Continue CIR with PT and OT Essential hypertension.  Blood pressure better controlled since starting HCTZ Dyslipidemia continue statin therapy DVT prophylaxis continue Lovenox    Length of stay, days: 13  Gordy SaversPeter F Kaeden Depaz , MD 02/10/2018, 8:54 AM

## 2018-02-11 ENCOUNTER — Inpatient Hospital Stay (HOSPITAL_COMMUNITY): Payer: Medicare Other

## 2018-02-11 ENCOUNTER — Encounter (HOSPITAL_COMMUNITY): Payer: Medicare Other | Admitting: Psychology

## 2018-02-11 LAB — CREATININE, SERUM
Creatinine, Ser: 1.66 mg/dL — ABNORMAL HIGH (ref 0.61–1.24)
GFR calc Af Amer: 47 mL/min — ABNORMAL LOW
GFR calc non Af Amer: 41 mL/min — ABNORMAL LOW

## 2018-02-11 MED ORDER — HYDROCHLOROTHIAZIDE 25 MG PO TABS
25.0000 mg | ORAL_TABLET | Freq: Every day | ORAL | Status: DC
  Administered 2018-02-12: 25 mg via ORAL
  Filled 2018-02-11: qty 1

## 2018-02-11 NOTE — Progress Notes (Signed)
Occupational Therapy Weekly Progress Note  Patient Details  Name: Wayne Griffin MRN: [id number] Date of Birth: 05/04/1945  Beginning of progress report period: February 04, 2018 End of progress report period: February 11, 2018  Patient has met 0 of 4 short term goals.  Pt progress has been minimal this past week.  Pt continues to c/o L shoulder pain and L knee pain.  Pt requires min/mod A for sitting balance EOB to engaged in bathing/dressing tasks.  Pt fatigues quickly and frequently has posterior LOB when sitting EOB.  Pt requires max A for UB dressing and tot A for LB dressing.  Pt requires use of Stedy for sit<>stand and standing for LB dressing.  Pt requires max encouragement to participate.  Pt d/c plan changed to SNF and LTG downgraded secondary to lack of progress and lack of participation.   Patient continues to demonstrate the following deficits: muscle weakness, decreased cardiorespiratoy endurance, abnormal tone and decreased coordination and decreased sitting balance, decreased standing balance, decreased postural control, hemiplegia and decreased balance strategies and therefore will continue to benefit from skilled OT intervention to enhance overall performance with BADL and Reduce care partner burden.  Patient not progressing toward long term goals.  See goal revision..  Plan of care revisions: LTGs downgraded to mod-max A overall..   LTG: Pt will maintain dynamic sitting balance during ADLs with Contact Guard/Touching assist  [downgraded 12/23] Filed 02/11/2018 1253  LTG: Pt will maintain dynamic standing balance during ADLs with Moderate Assistance - Patient 50 - 74%  [downgraded 12/23] Filed 02/11/2018 1253  Sit to Stand Goals    Most Recent Value  LTG: PT will perform sit to stand in prep for activites of daily living with assistance level Moderate Assistance - Patient 50 - 74%  [downgraded 12/23] Filed 02/11/2018 1253  Functional Goals    Most Recent Value  LTG: Use of  upper extremity in functional activities LUE as a stabilizer Filed 01/29/2018 1254  LTG: Pt will use upper extremity in functional activity with assistance level of Maximal Assistance - Patient 25 - 49%  [downgraded 12/23] Filed 02/11/2018 1253  LTG: Pt will perform tub/shower stall transfers with assistance level of Moderate Assistance - Patient 50 - 74%  [downgraded 12/23] Filed 02/11/2018 1253  Bathing Goals    Most Recent Value  LTG: Pt will perform bathing with assistance level/cueing Moderate Assistance - Patient 50 - 74%  [downgraded 12/23] Filed 02/11/2018 1253  LTG: Position pt will perform bathing At sink, Edge of bed [facility] 02/11/2018 1253  Dressing Goals    Most Recent Value  LTG: Pt will perform upper body dressing with assistance level of Moderate Assistance - Patient 50 - 74%  [downgraded 12/23] Filed 02/11/2018 1253  LTG: Pt will perform lower body dressing with assistance level of Maximal Assistance - Patient 25 - 49%  [downgraded 12/23] Filed 02/11/2018 1253  Toileting Goals    Most Recent Value  LTG: Pt will perform toileting task (3/3 steps) with assistance level  Moderate Assistance - Patient 50 - 74%  [downgraded 12/23] Filed 02/11/2018 1253  LTG: Pt will perform toilet transfers with assistance level of Maximal Assistance - Patient 25 - 49%  [downgraded 12/23] Filed 02/11/2018 1253  Grooming Goals    Most Recent Value  LTG: Pt will perform grooming with assistance level of Minimal Assistance - Patient > 75%  [downgraded 12/23] Filed 02/11/2018 1253     OT Short Term Goals Week 2:  OT Short Term Goal 1 (Week  2): Pt will transfer to toilet with mod-max A of 1. OT Short Term Goal 1 - Progress (Week 2): Progressing toward goal OT Short Term Goal 2 (Week 2): Pt will perform self ROM to LUE with min cues.   OT Short Term Goal 2 - Progress (Week 2): Progressing toward goal OT Short Term Goal 3 (Week 2): Pt will complete bathing tasks with min A OT Short Term Goal 3 -  Progress (Week 2): Progressing toward goal OT Short Term Goal 4 (Week 2): Pt will don pullover shirt with min A OT Short Term Goal 4 - Progress (Week 2): Progressing toward goal Week 3:  OT Short Term Goal 1 (Week 3): STG=LTG (downgraded) secondary to ELOS   Leroy Libman 02/11/2018, 6:49 AM

## 2018-02-11 NOTE — Progress Notes (Signed)
Occupational Therapy Session Note  Patient Details  Name: Wayne HaberGary Griffin MRN: [id number] Date of Birth: [birth year]  Today's Date: 02/11/2018 OT Individual Time: 1100-1130 OT Individual Time Calculation (min): 30 min    Short Term Goals: Week 3:  OT Short Term Goal 1 (Week 3): STG=LTG (downgraded) secondary to ELOS  Skilled Therapeutic Interventions/Progress Updates:    Pt resting in w/c upon arrival. OT intervention with focus on functional transfers, sitting balance EOB, and bed mobility.  Pt required max A for squat/scoot transfer to EOB.  Pt with LOB while seated EOB and required max A for correction.  Pt required max A for sit>supine in bed.  Pt assisted with pushing towards HOB-pushing through RLE and pulling with RUE on bed rail.  Pt remained in bed with all needs within reach and bed alarm activated.   Therapy Documentation Precautions:  Precautions Precautions: Fall Precaution Comments: L hemi Restrictions Weight Bearing Restrictions: No Pain: Pain Assessment Pain Scale: 0-10 Pain Score: 4  Pain Location: Shoulder Pain Descriptors / Indicators: Aching Pain Intervention(s): Medication (See eMAR)   Therapy/Group: Individual Therapy  Rich BraveLanier, [name] 02/11/2018, 12:19 PM

## 2018-02-11 NOTE — Progress Notes (Signed)
[place] PHYSICAL MEDICINE & REHABILITATION PROGRESS NOTE   Subjective/Complaints: Patient met with neuropsych, discussed findings with neuropsych  ROS:  Denies CP, SOB, N/V/D  Objective:   No results found. No results for input(s): WBC, HGB, HCT, PLT in the last 72 hours. Recent Labs    02/11/18 0611  CREATININE 1.66*    Intake/Output Summary (Last 24 hours) at 02/11/2018 1640 Last data filed at 02/11/2018 1300 Gross per 24 hour  Intake 240 ml  Output 500 ml  Net -260 ml     Physical Exam: Vital Signs Blood pressure (!) 146/73, pulse 75, temperature 98.1 F (36.7 C), temperature source Oral, resp. rate 20, height 5' 11"  (1.803 m), weight 106.3 kg, SpO2 96 %. Constitutional: No distress . Vital signs reviewed. HENT: Normocephalic.  Atraumatic. Eyes: EOMI. No discharge. Cardiovascular: RRR.  No JVD. Respiratory: CTA bilaterally.  Normal effort.  +  GI: BS +. Non-distended. Musc: No edema or tenderness in extremities. Neurologic: Alert. Motor: 5/5 in RUE/RLE, unchanged 0/5 left upper extremity proximal distal, unchanged  Left lower extremity: Hip flexion, knee extension 3-/5, ankle dorsiflexion 1/5 Skin: No evidence of breakdown, no evidence of rash Psych: Affect anxious  Assessment/Plan: 1. Functional deficits secondary to Left hemiplegia Right subcortical infarct which require 3+ hours per day of interdisciplinary therapy in a comprehensive inpatient rehab setting.  Physiatrist is providing close team supervision and 24 hour management of active medical problems listed below.  Physiatrist and rehab team continue to assess barriers to discharge/monitor patient progress toward functional and medical goals  Care Tool:  Bathing    Body parts bathed by patient: Left arm, Chest, Abdomen, Front perineal area, Right upper leg, Left upper leg, Face   Body parts bathed by helper: Right arm, Buttocks, Right lower leg, Left lower leg     Bathing assist Assist Level:  Moderate Assistance - Patient 50 - 74%     Upper Body Dressing/Undressing Upper body dressing   What is the patient wearing?: Pull over shirt    Upper body assist Assist Level: Moderate Assistance - Patient 50 - 74%    Lower Body Dressing/Undressing Lower body dressing      What is the patient wearing?: Incontinence brief, Pants     Lower body assist Assist for lower body dressing: Maximal Assistance - Patient 25 - 49%     Toileting Toileting    Toileting assist Assist for toileting: 2 Helpers     Transfers Chair/bed transfer  Transfers assist     Chair/bed transfer assist level: Moderate Assistance - Patient 50 - 74%     Locomotion Ambulation   Ambulation assist   Ambulation activity did not occur: Safety/medical concerns  Assist level: 2 helpers Assistive device: Other (comment)(rail) Max distance: 5 ft   Walk 10 feet activity   Assist  Walk 10 feet activity did not occur: Safety/medical concerns        Walk 50 feet activity   Assist Walk 50 feet with 2 turns activity did not occur: Safety/medical concerns         Walk 150 feet activity   Assist Walk 150 feet activity did not occur: Safety/medical concerns         Walk 10 feet on uneven surface  activity   Assist Walk 10 feet on uneven surfaces activity did not occur: Safety/medical concerns         Wheelchair     Assist Will patient use wheelchair at discharge?: Yes Type of Wheelchair: Manual  Wheelchair assist level: Supervision/Verbal cueing Max wheelchair distance: 50    Wheelchair 50 feet with 2 turns activity    Assist        Assist Level: Supervision/Verbal cueing   Wheelchair 150 feet activity     Assist Wheelchair 150 feet activity did not occur: Safety/medical concerns   Assist Level: Moderate Assistance - Patient 50 - 74%    Medical Problem List and Plan: 1.  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 Aspirin and Plavix therapy 3 weeks then aspirin alone, ? If speech difficulties yesterday were a TIA  CIR PT, OT ELOS 12/31 2.  DVT Prophylaxis/Anticoagulation: subcutaneous Lovenox. Monitor for any bleeding episodes, thrombocytopenia improving 3. Pain Management:  Tramadol as needed, added voltaren gel and Knee orthosis- improved pain,  mainly medial joint line  Changed tramadol to 48m q6 prn with zanaflex at noc 4. Mood: Ativan DC'd, Klonopin 0.25 3 times daily, remains very anxious nursing gets good results with nasal cannula without oxygen 5. Neuropsych: This patient is?  Fully capable of making decisions onhis own behalf. 6. Skin/Wound Care: Routine skin checks 7. Fluids/Electrolytes/Nutrition:  Routine in and out's  8. Hypertension. Norvasc 10 mg daily. Monitor with increased mobility   Vitals:   02/11/18 0845 02/11/18 1452  BP: (!) 150/88 (!) 146/73  Pulse:  75  Resp:  20  Temp:  98.1 F (36.7 C)  SpO2:  953% diastolic fluctuating, pt is about 2 wks post CVA, started HCTZ 12/18.  Increased dose to 25 mg 9. Hyperlipidemia. Lipitor 10. History of tobacco use. Provide counseling  11.  Borderline normocytic anemia:   Hemoglobin 12.9 on 12/16  Continue to monitor, no obvious source of blood loss 12.  Hematuria:?  Resolved 13.  AKI  Creatinine 1.61 on 12/16  Continue to monitor 14.  Urinary frequency  PVRs ordered  LOS: 14 days A FACE TO FACE EVALUATION WAS PERFORMED  ACharlett Blake12/23/2019, 4:40 PM

## 2018-02-11 NOTE — Consult Note (Signed)
Neuropsychological Consultation   Patient:   Wayne Griffin   DOB:   [birth year]  MR Number:  [id number]  [facility]  [facility] [address]4W [facility] A [address] [id number] [facility] [id number] Dept: [phone] Loc: [phone]           Date of Service:   02/11/2018  Start Time:   8 AM End Time:   9 AM  Provider/Observer:  Arley PhenixJohn Caela Huot, Psy.D.       Clinical Neuropsychologist       Billing Code/Service: [phone] 4 Units  Chief Complaint:    Wayne Griffin is a 72 year old male with history of hypertension and tobacco abuse.  The patient presented on 01/24/2018 with left facial droop and left-sided weakness with dysarthria and recent fall.  MRI showed acute/subacute nonhemorrhagic infarction involving the posterior right lentiform nucleus, posterior limb right internal capsule and dorsal caudate.  There was indication of remote hemorrhagic infarct of the right lentiform nucleus just anterior to the area of acute subacute infarction.  The patient also has a prior history of anxiety.  The patient had recently moved in with his son due to worsening or limited functioning.  Reason for Service:  The patient was referred for neuropsychological consultation due to coping and adjustment issues with prior history of anxiety.  Below is the HPI for the current admission.    HPI: . Wayne Griffin is a 72 year old right-handed male with history of hypertension and tobacco abuse on no prescription medications. Per chart review and patient, patient lives with son. Independent prior to admission using a cane. Son works during the day. One level home with 2-3 steps to entry. Presented 01/24/2018 with left facial droop and left-sided weakness with dysarthria as well as a fall. Cranial CT reviewed, unremarkable for acute intracranial process.  Per report, there was some question of focal hyperdensity at the right MCA bifurcation. Patient did receive TPA. CT  angiogram of head and neck negative perfusion study.No large vessel occlusion or stenosis. MRI showed acute/subacute nonhemorrhagic infarction involving the posterior right lentiform nucleus, posterior limb right internal capsule and dorsal caudate. Remote hemorrhagic infarct of the right lentiform nucleus just anterior to the area of the acute subacute infarction. Echocardiogram with ejection fraction of 60% grade 1 diastolic dysfunction. Neurology follow-up placed on aspirin and Plavix therapy 3 weeks then aspirin alone.subcutaneous Lovenox for DVT prophylaxis. Tolerating a mechanical soft diet. Blood pressures monitored initially in need of Cleviprex since improved. Therapy evaluations completed with recommendations of physical medicine rehabilitation consult. Patient was admitted for a comprehensive rehabilitation program.  Current Status:  The patient reports that he has been more worried and fearful that he will not be able to have a "normal" life and that it will be too much for his son.  The patient recently moved in with son.  The patient reports that even with the improvement in left leg functioning, that he is still having pain in leg and shoulder.  He reports that with pain he starts worrying about pain and will have "breathing issues."  It appears that some of these breathing issues are due to anxiety/panic.    Behavioral Observation: Wayne Griffin  presents as a 72 y.o.-year-old Right Caucasian Male who appeared his stated age. his dress was Appropriate and he was Well Groomed and his manners were Appropriate to the situation.  his participation was indicative of Appropriate and Attentive behaviors.  There were any physical disabilities noted.  he displayed an  appropriate level of cooperation and motivation.     Interactions:    Active Appropriate and Attentive  Attention:   abnormal and attention span appeared shorter than expected for age  Memory:   within normal limits; recent and remote  memory intact  Visuo-spatial:  not examined  Speech (Volume):  low  Speech:   normal; normal  Thought Process:  Coherent and Relevant  Though Content:  WNL; not suicidal and not homicidal  Orientation:   person, place, time/date and situation  Judgment:   Fair  Planning:   Fair  Affect:    Anxious  Mood:    Anxious  Insight:   Fair  Intelligence:   normal  Medical History:  History reviewed. No pertinent past medical history.  Psychiatric History:  Patient has past history for anxiety.  Family Med/Psych History:  Family History  Problem Relation Age of Onset  . Hypertension Mother   . Stroke Mother   . Cancer Mother   . Hypertension Father     Risk of Suicide/Violence: low Patient denies SI or HI.  Impression/DX:  Wayne Griffin is a 72 year old male with history of hypertension and tobacco abuse.  The patient presented on 01/24/2018 with left facial droop and left-sided weakness with dysarthria and recent fall.  MRI showed acute/subacute nonhemorrhagic infarction involving the posterior right lentiform nucleus, posterior limb right internal capsule and dorsal caudate.  There was indication of remote hemorrhagic infarct of the right lentiform nucleus just anterior to the area of acute subacute infarction.  The patient also has a prior history of anxiety.  The patient had recently moved in with his son due to worsening or limited functioning.  The patient reports that he has been more worried and fearful that he will not be able to have a "normal" life and that it will be too much for his son.  The patient recently moved in with son.  The patient reports that even with the improvement in left leg functioning, that he is still having pain in leg and shoulder.  He reports that with pain he starts worrying about pain and will have "breathing issues."  It appears that some of these breathing issues are due to anxiety/panic.   Disposition/Plan:  Will follow-up with patient next  week if needed.  Diagnosis:    Anxiety - Plan: DISCONTINUED: LORazepam (ATIVAN) tablet 0.5 mg         Electronically Signed   _______________________ Arley PhenixJohn Latorie Montesano, Psy.D.

## 2018-02-11 NOTE — Progress Notes (Signed)
Physical Therapy Session Note  Patient Details  Name: Wayne Griffin MRN: [id number] Date of Birth: 09/21/1945  Today's Date: 02/11/2018 PT Individual Time: 1000-1030 PT Individual Time Calculation (min): 30 min   Short Term Goals: Week 2:  PT Short Term Goal 1 (Week 2): Pt will perform bed<>chair transfer with mod assist PT Short Term Goal 2 (Week 2): Pt will ambulate x 10 ft max assist +1 PT Short Term Goal 3 (Week 2): Pt will perform sit<>stand consistently with mod assist.   Skilled Therapeutic Interventions/Progress Updates:    Pt supine in bed upon PT arrival, agreeable to therapy tx and reports pain 4/10 in L shoulder. Pt performs bridging to pull pants over hips, therapist loops LEs through. Therapist dons thigh high teds and shoes total assist. Pt transferred to EOB with mod assist, slideboard transfer to w/c with mod assist lateral scoot. Pt propelled from room<>gym this session 2 x 150 ft with supervision. Pt performed x 2 sit<>stands at rail with max assist +2. Pt ambulated x 5 ft with max assist, w/c follow and trial of L AFO. Pt left seated in w/c at end of session with needs in reach.   Therapy Documentation Precautions:  Precautions Precautions: Fall Precaution Comments: L hemi Restrictions Weight Bearing Restrictions: No    Therapy/Group: Individual Therapy  Cresenciano GenreEmily van Schagen, PT, DPT 02/11/2018, 7:51 AM

## 2018-02-12 ENCOUNTER — Inpatient Hospital Stay (HOSPITAL_COMMUNITY): Payer: Self-pay | Admitting: Physical Therapy

## 2018-02-12 ENCOUNTER — Inpatient Hospital Stay (HOSPITAL_COMMUNITY): Payer: Self-pay

## 2018-02-12 ENCOUNTER — Inpatient Hospital Stay (HOSPITAL_COMMUNITY): Payer: Medicare Other

## 2018-02-12 MED ORDER — HYDRALAZINE HCL 10 MG PO TABS
10.0000 mg | ORAL_TABLET | Freq: Three times a day (TID) | ORAL | Status: DC
  Administered 2018-02-12 – 2018-02-19 (×20): 10 mg via ORAL
  Filled 2018-02-12 (×20): qty 1

## 2018-02-12 NOTE — Progress Notes (Signed)
Occupational Therapy Session Note  Patient Details  Name: Wayne HaberGary Griffin MRN: [id number] Date of Birth: [birth year]  Today's Date: 02/12/2018 OT Individual Time: 1300-1315 OT Individual Time Calculation (min): 15 min  and Today's Date: 02/12/2018 OT Missed Time: 15 Minutes Missed Time Reason: Patient fatigue   Short Term Goals: Week 2:  OT Short Term Goal 1 (Week 2): Pt will transfer to toilet with mod-max A of 1. OT Short Term Goal 1 - Progress (Week 2): Progressing toward goal OT Short Term Goal 2 (Week 2): Pt will perform self ROM to LUE with min cues.   OT Short Term Goal 2 - Progress (Week 2): Progressing toward goal OT Short Term Goal 3 (Week 2): Pt will complete bathing tasks with min A OT Short Term Goal 3 - Progress (Week 2): Progressing toward goal OT Short Term Goal 4 (Week 2): Pt will don pullover shirt with min A OT Short Term Goal 4 - Progress (Week 2): Progressing toward goal Week 3:  OT Short Term Goal 1 (Week 3): STG=LTG (downgraded) secondary to ELOS  Skilled Therapeutic Interventions/Progress Updates:    Pt resting in bed upon arrival.  Pt required encouragement to engaged in therapy and c/o that he had a rough morning and was too tired to get OOB.  Focus on bed mobility and sitting balance.  Pt required mod A for supine>sit EOB and min A for sitting balance.  Pt required max A for sit>supine in bed.  Pt remained in bed with all needs within reach and bed alarm activated.   Therapy Documentation Precautions:  Precautions Precautions: Fall Precaution Comments: L hemi Restrictions Weight Bearing Restrictions: No General: General OT Amount of Missed Time: 15 Minutes Vital Signs:  Pain:  Pt c/o L shoulder pain (4/10); repositioned and emotional support   Therapy/Group: Individual Therapy  Rich BraveLanier, Guenevere Roorda Chappell 02/12/2018, 1:24 PM

## 2018-02-12 NOTE — Progress Notes (Signed)
Physical Therapy Weekly Progress Note  Patient Details  Name: Wayne Griffin MRN: [id number] Date of Birth: 1945/04/27  Beginning of progress report period: February 05, 2018 End of progress report period: February 12, 2018  Today's Date: 02/12/2018 PT Individual Time: 1030-1055 PT Individual Time Calculation (min): 25 min   Patient has met 1 of 3 short term goals. Pt is making slow and limited progress towards LTGs 2/2 decreased participation as a result of L shoulder pain, decreased motivation, and self-limiting behavior. He continues to require mod assist to max assist +2, depending on fatigue level, for OOB mobility including transfers, bed mobility, and gait. He is performing w/c propulsion w/ supervision via R hemi technique.   Patient continues to demonstrate the following deficits muscle weakness, decreased cardiorespiratoy endurance, unbalanced muscle activation, decreased coordination and decreased motor planning, decreased problem solving and decreased sitting balance, decreased standing balance, decreased postural control, hemiplegia and decreased balance strategies and therefore will continue to benefit from skilled PT intervention to increase functional independence with mobility.  Patient not progressing toward long term goals.  See goal revision..  Plan of care revisions: See Plan of Care note from 12/20. LTGs downgraded at that time 2/2 lack of progress, mod-max assist overall. Pt additionally made QD at that time. D/c plan changed to SNF per discussion w/ medical team. Will continue to work towards [place] and to decrease burden of care at next level.   PT Short Term Goals Week 2:  PT Short Term Goal 1 (Week 2): Pt will perform bed<>chair transfer with mod assist PT Short Term Goal 1 - Progress (Week 2): Met PT Short Term Goal 2 (Week 2): Pt will ambulate x 10 ft max assist +1 PT Short Term Goal 2 - Progress (Week 2): Progressing toward goal PT Short Term Goal 3 (Week 2): Pt will  perform sit<>stand consistently with mod assist.  PT Short Term Goal 3 - Progress (Week 2): Not met Week 3:  PT Short Term Goal 1 (Week 3): =LTGs due to ELOS  Skilled Therapeutic Interventions/Progress Updates:   Pt in supine and agreeable to therapy, no c/o pain at rest. C/o L shoulder pain w/ mobility, resolves w/ rest. Total assist to don TEDs and socks. Attempted to void in urinal in supine, pt unsuccessful. Transferred to EOB w/ max assist and maintain static sitting w/ close supervision while therapist set-up stedy for transfer. Transferred to w/c via stedy when pt reported he needed to have BM. Stedy transfer to/from toilet w/ total assist for pericare and brief management. Tactile and verbal cues during sit<>stands for upright posture and for glut activation in stance. Returned to EOB and to supine w/ max assist, ended session in supine and all needs in reach.   Therapy Documentation Precautions:  Precautions Precautions: Fall Precaution Comments: L hemi Restrictions Weight Bearing Restrictions: No Vital Signs: Therapy Vitals BP: (!) 162/84  Therapy/Group: Individual Therapy  Deara Bober K Abrey Bradway 02/12/2018, 11:04 AM

## 2018-02-12 NOTE — Progress Notes (Signed)
Social Work Patient ID: Wayne Griffin, male   DOB: 11-29-1945, 72 y.o.   MRN: [id number] Met with pt to discuss team conference progress and the time now to being looking for NH bed. Awaiting son's call regarding preferences in [place] area. Pt has no pref and seems to be somewhat depressed regarding his level here.

## 2018-02-12 NOTE — Progress Notes (Signed)
PHYSICAL MEDICINE & REHABILITATION PROGRESS NOTE   Subjective/Complaints:  No issues overnite except states that he has urinary urgency.  Has inc associated with this, no dysuria  ROS:  Denies CP, SOB, N/V/D  Objective:   No results found. No results for input(s): WBC, HGB, HCT, PLT in the last 72 hours. Recent Labs    02/11/18 0611  CREATININE 1.66*    Intake/Output Summary (Last 24 hours) at 02/12/2018 0902 Last data filed at 02/12/2018 0813 Gross per 24 hour  Intake 240 ml  Output 1225 ml  Net -985 ml     Physical Exam: Vital Signs Blood pressure (!) 162/84, pulse 62, temperature 98.4 F (36.9 C), resp. rate 18, height _0  (1.803 m), weight 106.3 kg, SpO2 97 %. Constitutional: No distress . Vital signs reviewed. HENT: Normocephalic.  Atraumatic. Eyes: EOMI. No discharge. Cardiovascular: RRR.  No JVD. Respiratory: CTA bilaterally.  Normal effort.  +  GI: BS +. Non-distended. Musc: No edema or tenderness in extremities. Neurologic: Alert. Motor: 5/5 in RUE/RLE, unchanged 0/5 left upper extremity proximal distal, unchanged  Left lower extremity: Hip flexion, knee extension 3-/5, ankle dorsiflexion 1/5 Skin: No evidence of breakdown, no evidence of rash Psych: Affect anxious  Assessment/Plan: 1. Functional deficits secondary to Left hemiplegia Right subcortical infarct which require 3+ hours per day of interdisciplinary therapy in a comprehensive inpatient rehab setting.  Physiatrist is providing close team supervision and 24 hour management of active medical problems listed below.  Physiatrist and rehab team continue to assess barriers to discharge/monitor patient progress toward functional and medical goals  Care Tool:  Bathing    Body parts bathed by patient: Left arm, Chest, Abdomen, Front perineal area, Right upper leg, Left upper leg, Face   Body parts bathed by helper: Right arm, Buttocks, Right lower leg, Left lower leg     Bathing  assist Assist Level: Moderate Assistance - Patient 50 - 74%     Upper Body Dressing/Undressing Upper body dressing   What is the patient wearing?: Pull over shirt    Upper body assist Assist Level: Moderate Assistance - Patient 50 - 74%    Lower Body Dressing/Undressing Lower body dressing      What is the patient wearing?: Incontinence brief, Pants     Lower body assist Assist for lower body dressing: Maximal Assistance - Patient 25 - 49%     Toileting Toileting    Toileting assist Assist for toileting: 2 Helpers     Transfers Chair/bed transfer  Transfers assist     Chair/bed transfer assist level: Moderate Assistance - Patient 50 - 74%     Locomotion Ambulation   Ambulation assist   Ambulation activity did not occur: Safety/medical concerns  Assist level: 2 helpers Assistive device: Other (comment)(rail) Max distance: 5 ft   Walk 10 feet activity   Assist  Walk 10 feet activity did not occur: Safety/medical concerns        Walk 50 feet activity   Assist Walk 50 feet with 2 turns activity did not occur: Safety/medical concerns         Walk 150 feet activity   Assist Walk 150 feet activity did not occur: Safety/medical concerns         Walk 10 feet on uneven surface  activity   Assist Walk 10 feet on uneven surfaces activity did not occur: Safety/medical concerns         Wheelchair     Assist Will patient use wheelchair at  discharge?: Yes Type of Wheelchair: Manual    Wheelchair assist level: Supervision/Verbal cueing Max wheelchair distance: 50    Wheelchair 50 feet with 2 turns activity    Assist        Assist Level: Supervision/Verbal cueing   Wheelchair 150 feet activity     Assist Wheelchair 150 feet activity did not occur: Safety/medical concerns   Assist Level: Moderate Assistance - Patient 50 - 74%    Medical Problem List and Plan: 1.  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 Aspirin and Plavix therapy 3 weeks then aspirin alone, ? If speech difficulties yesterday were a TIA  CIR PT, OT ELOS 12/31  Team conference today please see physician documentation under team conference tab, met with team face-to-face to discuss problems,progress, and goals. Formulized individual treatment plan based on medical history, underlying problem and comorbidities. 2.  DVT Prophylaxis/Anticoagulation: subcutaneous Lovenox. Monitor for any bleeding episodes, thrombocytopenia improving 3. Pain Management:  Tramadol as needed, added voltaren gel and Knee orthosis- improved pain,  mainly medial joint line  Changed tramadol to 68m q6 prn with zanaflex at noc 4. Mood: Ativan DC'd, Klonopin 0.25 3 times daily, remains very anxious nursing gets good results with nasal cannula without oxygen 5. Neuropsych: This patient is?  Fully capable of making decisions onhis own behalf. 6. Skin/Wound Care: Routine skin checks 7. Fluids/Electrolytes/Nutrition:  Routine in and out's  8. Hypertension. Norvasc 10 mg daily. Monitor with increased mobility   Vitals:   02/12/18 0608 02/12/18 0856  BP: 137/78 (!) 162/84  Pulse: 62   Resp: 18   Temp: 98.4 F (36.9 C)   SpO2: 923%  diastolic fluctuating, pt is about 2 wks post CVA, started HCTZ 12/18.  Increased dose to 25 mg- still elevated will change to hydralazine 167mTID, avoid ACE and Diuretic due to elevated creat, HR low 60s avoid BB 9. Hyperlipidemia. Lipitor 10. History of tobacco use. Provide counseling  11.  Borderline normocytic anemia:   Hemoglobin 12.9 on 12/16  Continue to monitor, no obvious source of blood loss 12.  Hematuria:?  Resolved 13.  AKI  Creatinine 1.61 on 12/16, 1.66 on 12/23, may be related to HCTZ will d/c  Continue to monitor 14.  Urinary frequency  PVRs ordered  LOS: 15 days A FACE TO FACE EVALUATION WAS PERFORMED  Wayne Blake2/24/2019, 9:02 AM

## 2018-02-12 NOTE — Patient Care Conference (Signed)
Inpatient RehabilitationTeam Conference and Plan of Care Update Date: 02/12/2018   Time: 11:10 AM    Patient Name: Wayne HaberGary Chumney      Medical Record Number: [id number]  Date of Birth: [birth year] Sex: Male         Room/Bed: 4W19C/4W19C-01 Payor Info: Payor: MEDICARE / Plan: MEDICARE PART A AND B / Product Type: *No Product type* /    Admitting Diagnosis: R CVA  Admit Date/Time:  01/28/2018  2:56 PM Admission Comments: No comment available   Primary Diagnosis:  <principal problem not specified> Principal Problem: <principal problem not specified>  Patient Active Problem List   Diagnosis Date Noted  . AKI (acute kidney injury) (HCC)   . Acute blood loss anemia   . Labile blood pressure   . Essential hypertension 01/28/2018  . Hyperlipidemia 01/28/2018  . Left leg pain 01/28/2018  . Obesity 01/28/2018  . 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 Small vessel disease (HCC) 01/28/2018  . Anxiety   . Cerebrovascular accident (CVA) (HCC)   . History of tobacco use   . Dyslipidemia   . Hemiparesis affecting left side as late effect of stroke (HCC)   . Acute CVA (cerebrovascular accident) [facility](HCC) s/p IV tPA 01/24/2018    Expected Discharge Date: Expected Discharge Date: 02/19/18  Team Members Present: Physician leading conference: Dr. Claudette LawsAndrew Kirsteins Social Worker Present: Dossie DerBecky Kelii Chittum, LCSW Nurse Present: Chana Bodeeborah Sharp, RN PT Present: Carlynn PurlAmy Tally, PT OT Present: Ardis Rowanom Lanier, COTA SLP Present: Reuel DerbyHappi Overton, SLP PPS Coordinator present : Tora DuckMarie Noel, RN, CRRN     Current Status/Progress Goal Weekly Team Focus  Medical   Left hemiparesis upper extremity greater than lower extremity, blood pressures still with elevated systolics  Reduce fall restabilize blood pressure  Switching medications given his comorbidities of elevated creatinine and bradycardia   Bowel/Bladder   LBM 12/22 Mostly continent x2  Pt will remain continent  assess bowel and bladder needs qshift.    Swallow/Nutrition/ Hydration             ADL's   max A overall, therapy schedule changed to QD due to lack of participation  downgraded to mod/max overall  activity tolerance, sitting balance, safety awareness, bed mobility   Mobility   mod-max assist bed mobility and transfers, max+2 for gait up to 5 ft  min assist   OOB tolerance, pain management, endurance, L NMR, gait   Communication             Safety/Cognition/ Behavioral Observations            Pain   C/o of pain to left knee, denies pain at night. Pain under control with tramadol and volteren gel  pain less than 2  assess pain qshift and PRN   Skin   Abrasion to right foot, skin tear to left arm  promote proper wound healing. maintain skin integrity  assess skin qshift and PRN      *See Care Plan and progress notes for long and short-term goals.     Barriers to Discharge  Current Status/Progress Possible Resolutions Date Resolved   Physician    Medical stability;Decreased caregiver support     Knee pain improved, some shoulder pain  See above      Nursing                  PT                    OT  SLP                SW                Discharge Planning/Teaching Needs:  Plan now is NHP due to does nto ahve 24 hr care at home. Son is looking at facilities and will get back with this worker.      Team Discussion:  Slow to progress and lacks motivation at times to participate. Seems depressed due to level of care he requires seeing neuro-psych for coping. MD monitoring his BP. Dys 3 thin liquid diet. Plan changed to NHP  Revisions to Treatment Plan:  NHP    Continued Need for Acute Rehabilitation Level of Care: The patient requires daily medical management by a physician with specialized training in physical medicine and rehabilitation for the following conditions: Daily direction of a multidisciplinary physical rehabilitation program to ensure safe treatment while eliciting the highest outcome  that is of practical value to the patient.: Yes Daily medical management of patient stability for increased activity during participation in an intensive rehabilitation regime.: Yes Daily analysis of laboratory values and/or radiology reports with any subsequent need for medication adjustment of medical intervention for : Neurological problems;Blood pressure problems   I attest that I was present, lead the team conference, and concur with the assessment and plan of the team.   Lucy Chrisupree, Alija Riano G 02/12/2018, 12:58 PM

## 2018-02-12 NOTE — [place] FL2 (Signed)
[place] MEDICAID FL2 LEVEL OF CARE SCREENING TOOL     IDENTIFICATION  Patient Name: Wayne HaberGary Woon Birthdate: 11/25/1945 Sex: male Admission Date (Current Location): 01/28/2018  [facility] [place]   Facility and Address:  [facility], [address], [place], [place] [address]      Provider Number: [id number]  Attending Physician Name and Address:  Erick ColaceKirsteins, Andrew E, MD  Relative Name and Phone Number:  Clay-son [phone]    Current Level of Care: Other (Comment)(Rehab) Recommended Level of Care: Skilled Nursing Facility Prior Approval Number:    Date Approved/Denied:   PASRR Number: [id number]  Discharge Plan: SNF    Current Diagnoses: Patient Active Problem List   Diagnosis Date Noted  . AKI (acute kidney injury) (HCC)   . Acute blood loss anemia   . Labile blood pressure   . Essential hypertension 01/28/2018  . Hyperlipidemia 01/28/2018  . Left leg pain 01/28/2018  . Obesity 01/28/2018  . 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 Small vessel disease (HCC) 01/28/2018  . Anxiety   . Cerebrovascular accident (CVA) (HCC)   . History of tobacco use   . Dyslipidemia   . Hemiparesis affecting left side as late effect of stroke (HCC)   . Acute CVA (cerebrovascular accident) (HCC) s/p IV tPA 01/24/2018    Orientation RESPIRATION BLADDER Height & Weight     Self, Time, Situation, Place  Normal Continent Weight: 234 lb 5.6 oz (106.3 kg) Height:  5\' 11"  (180.3 cm)  BEHAVIORAL SYMPTOMS/MOOD NEUROLOGICAL BOWEL NUTRITION STATUS      Continent Diet(Dys 3 thin liquids)  AMBULATORY STATUS COMMUNICATION OF NEEDS Skin   Limited Assist Verbally Normal                       Personal Care Assistance Level of Assistance  Bathing, Feeding, Dressing Bathing Assistance: Limited assistance Feeding assistance: Independent Dressing Assistance: Limited assistance     Functional Limitations Info  Speech      Speech Info: Impaired    SPECIAL CARE FACTORS FREQUENCY  PT (By licensed PT), OT (By licensed OT), Speech therapy     PT Frequency: 5x week OT Frequency: 5x week     Speech Therapy Frequency: 5x week      Contractures Contractures Info: Present    Additional Factors Info  Code Status Code Status Info: Full Code             Current Medications (02/12/2018):  This is the current hospital active medication list Current Facility-Administered Medications  Medication Dose Route Frequency Provider Last Rate Last Dose  . acetaminophen (TYLENOL) tablet 650 mg  650 mg Oral Q4H PRN Charlton Amorngiulli, Daniel J, PA-C   650 mg at 02/09/18 1024  . amLODipine (NORVASC) tablet 10 mg  10 mg Oral Daily Charlton Amorngiulli, Daniel J, PA-C   10 mg at 02/12/18 0857  . aspirin EC tablet 81 mg  81 mg Oral Daily Charlton Amorngiulli, Daniel J, PA-C   81 mg at 02/12/18 [id number]  . atorvastatin (LIPITOR) tablet 40 mg  40 mg Oral q1800 Charlton Amorngiulli, Daniel J, PA-C   40 mg at 02/11/18 1725  . bisacodyl (DULCOLAX) suppository 10 mg  10 mg Rectal Daily PRN Charlton Amorngiulli, Daniel J, PA-C   10 mg at 01/31/18 1042  . citalopram (CELEXA) tablet 10 mg  10 mg Oral Daily Kirsteins, Victorino SparrowAndrew E, MD   10 mg at 02/12/18 0859  . clonazePAM (KLONOPIN) disintegrating tablet 0.25  mg  0.25 mg Oral TID Erick ColaceKirsteins, Andrew E, MD   0.25 mg at 02/12/18 1328  . clopidogrel (PLAVIX) tablet 75 mg  75 mg Oral Daily Charlton Amorngiulli, Daniel J, PA-C   75 mg at 02/12/18 0856  . diclofenac sodium (VOLTAREN) 1 % transdermal gel 2 g  2 g Topical QID Erick ColaceKirsteins, Andrew E, MD   2 g at 02/12/18 0900  . enoxaparin (LOVENOX) injection 40 mg  40 mg Subcutaneous Q24H AngiulliMcarthur Rossetti, Daniel J, PA-C   40 mg at 02/12/18 0900  . hydrALAZINE (APRESOLINE) tablet 10 mg  10 mg Oral Q8H Kirsteins, Victorino SparrowAndrew E, MD   Stopped at 02/12/18 1241  . pantoprazole (PROTONIX) EC tablet 40 mg  40 mg Oral Daily Charlton Amorngiulli, Daniel J, PA-C   40 mg at 02/12/18 0858  . senna-docusate (Senokot-S) tablet 2 tablet  2 tablet Oral BID  Erick ColaceKirsteins, Andrew E, MD   2 tablet at 02/12/18 [phone]  . sodium phosphate (FLEET) 7-19 GM/118ML enema 1 enema  1 enema Rectal Daily PRN Charlton Amorngiulli, Daniel J, PA-C   1 enema at 02/09/18 1327  . sorbitol 70 % solution 30 mL  30 mL Oral Daily PRN Charlton Amorngiulli, Daniel J, PA-C   30 mL at 02/12/18 1328  . tiZANidine (ZANAFLEX) tablet 2 mg  2 mg Oral QHS Kirsteins, Victorino SparrowAndrew E, MD   2 mg at 02/11/18 2020  . traMADol (ULTRAM) tablet 50 mg  50 mg Oral Q6H PRN Ranelle OysterSwartz, Zachary T, MD   50 mg at 02/11/18 1056     Discharge Medications: Please see discharge summary for a list of discharge medications.  Relevant Imaging Results:  Relevant Lab Results:   Additional Information SSN: [id number]  Eitan Doubleday, Lemar LivingsRebecca G, LCSW

## 2018-02-13 DIAGNOSIS — I639 Cerebral infarction, unspecified: Secondary | ICD-10-CM

## 2018-02-13 NOTE — Progress Notes (Signed)
[place] PHYSICAL MEDICINE & REHABILITATION PROGRESS NOTE   Subjective/Complaints: Patient seen laying in bed.  He states he slept well overnight.  He is happy that it is Christmas.  ROS: Denies CP, SOB, N/V/D  Objective:   No results found. No results for input(s): WBC, HGB, HCT, PLT in the last 72 hours. Recent Labs    02/11/18 0611  CREATININE 1.66*    Intake/Output Summary (Last 24 hours) at 02/13/2018 1857 Last data filed at 02/13/2018 1835 Gross per 24 hour  Intake 250 ml  Output 100 ml  Net 150 ml     Physical Exam: Vital Signs Blood pressure 137/72, pulse 71, temperature 98.1 F (36.7 C), temperature source Oral, resp. rate 19, height 5\' 11"  (1.803 m), weight 106.3 kg, SpO2 98 %. Constitutional: No distress . Vital signs reviewed. HENT: Normocephalic.  Atraumatic. Eyes: EOMI. No discharge. Cardiovascular: RRR.  No JVD. Respiratory: CTA bilaterally.  Normal effort.  GI: BS +. Non-distended. Musc: No edema or tenderness in extremities. Neurologic: Alert. Motor: 5/5 in RUE/RLE, unchanged 0/5 left upper extremity proximal distal, stable Left lower extremity: Hip flexion, knee extension 2/5, ankle dorsiflexion 1/5 Skin: No evidence of breakdown, no evidence of rash Psych: Affect anxious  Assessment/Plan: 1. Functional deficits secondary to Left hemiplegia Right subcortical infarct which require 3+ hours per day of interdisciplinary therapy in a comprehensive inpatient rehab setting.  Physiatrist is providing close team supervision and 24 hour management of active medical problems listed below.  Physiatrist and rehab team continue to assess barriers to discharge/monitor patient progress toward functional and medical goals  Care Tool:  Bathing    Body parts bathed by patient: Left arm, Chest, Abdomen, Front perineal area, Right upper leg, Left upper leg, Face   Body parts bathed by helper: Right arm, Buttocks, Right lower leg, Left lower leg     Bathing  assist Assist Level: Moderate Assistance - Patient 50 - 74%     Upper Body Dressing/Undressing Upper body dressing   What is the patient wearing?: Pull over shirt    Upper body assist Assist Level: Moderate Assistance - Patient 50 - 74%    Lower Body Dressing/Undressing Lower body dressing      What is the patient wearing?: Incontinence brief, Pants     Lower body assist Assist for lower body dressing: Maximal Assistance - Patient 25 - 49%     Toileting Toileting    Toileting assist Assist for toileting: 2 Helpers     Transfers Chair/bed transfer  Transfers assist     Chair/bed transfer assist level: Dependent - mechanical lift(stedy)     Locomotion Ambulation   Ambulation assist   Ambulation activity did not occur: Safety/medical concerns  Assist level: 2 helpers Assistive device: Other (comment)(rail) Max distance: 5 ft   Walk 10 feet activity   Assist  Walk 10 feet activity did not occur: Safety/medical concerns        Walk 50 feet activity   Assist Walk 50 feet with 2 turns activity did not occur: Safety/medical concerns         Walk 150 feet activity   Assist Walk 150 feet activity did not occur: Safety/medical concerns         Walk 10 feet on uneven surface  activity   Assist Walk 10 feet on uneven surfaces activity did not occur: Safety/medical concerns         Wheelchair     Assist Will patient use wheelchair at discharge?: Yes Type of  Wheelchair: Chief of StaffManual    Wheelchair assist level: Supervision/Verbal cueing Max wheelchair distance: 50    Wheelchair 50 feet with 2 turns activity    Assist        Assist Level: Supervision/Verbal cueing   Wheelchair 150 feet activity     Assist Wheelchair 150 feet activity did not occur: Safety/medical concerns   Assist Level: Moderate Assistance - Patient 50 - 74%    Medical Problem List and Plan: 1.  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 Aspirin and Plavix therapy 3 weeks then aspirin alone, ? If speech difficulties yesterday were a TIA  Continue CIR tomorrow  2.  DVT Prophylaxis/Anticoagulation: subcutaneous Lovenox. Monitor for any bleeding episodes 3. Pain Management:  Tramadol as needed, added voltaren gel and Knee orthosis- improved pain,  mainly medial joint line  Changed tramadol to 50mg  q6 prn with zanaflex at noc 4. Mood: Ativan DC'd, Klonopin 0.25 3 times daily, remains very anxious nursing gets good results with nasal cannula without oxygen 5. Neuropsych: This patient is?  Fully capable of making decisions onhis own behalf. 6. Skin/Wound Care: Routine skin checks 7. Fluids/Electrolytes/Nutrition:  Routine in and out's  8. Hypertension. Norvasc 10 mg daily. Monitor with increased mobility   Vitals:   02/13/18 0529 02/13/18 1522  BP: (!) 146/123 137/72  Pulse: 61 71  Resp: 16 19  Temp: 97.7 F (36.5 C) 98.1 F (36.7 C)  SpO2: 95% 98%   No improvement with HCTZ, hydralazine 10mg  TID, avoid ACE and Diuretic due to elevated creat, HR low 60s avoid BB  Relatively controlled on 12/25 9. Hyperlipidemia. Lipitor 10. History of tobacco use. Provide counseling  11.  Borderline normocytic anemia:   Hemoglobin 12.9 on 12/16  Continue to monitor, no obvious source of blood loss 12.  Hematuria:?  Resolved 13.  AKI  Creatinine 1.66 on 12/23  Continue to monitor 14.  Urinary frequency  PVRs relatively low volumes  LOS: 16 days A FACE TO FACE EVALUATION WAS PERFORMED  Tru Leopard Karis Jubanil Jatorian Renault 02/13/2018, 6:57 PM

## 2018-02-14 ENCOUNTER — Inpatient Hospital Stay (HOSPITAL_COMMUNITY): Payer: Medicare Other | Admitting: Physical Therapy

## 2018-02-14 ENCOUNTER — Encounter (HOSPITAL_COMMUNITY): Payer: Self-pay | Admitting: *Deleted

## 2018-02-14 ENCOUNTER — Inpatient Hospital Stay (HOSPITAL_COMMUNITY): Payer: Medicare Other | Admitting: Occupational Therapy

## 2018-02-14 NOTE — Progress Notes (Signed)
Orthopedic Tech Progress Note Patient Details:  Ernest HaberGary Sliter [birth year] [id number]  Patient ID: Ernest HaberGary Nevills, male   DOB: [birth year], 72 y.o.   MRN: [id number] Called in order to hanger [name], Joffrey Kerce J 02/14/2018, 1:48 PM

## 2018-02-14 NOTE — Progress Notes (Signed)
Physical Therapy Session Note  Patient Details  Name: Wayne Griffin MRN: [id number] Date of Birth: [birth year]  Today's Date: 02/14/2018 PT Individual Time: 1300-1325 PT Individual Time Calculation (min): 25 min   Short Term Goals: Week 3:  PT Short Term Goal 1 (Week 3): =LTGs due to ELOS  Skilled Therapeutic Interventions/Progress Updates:   Pt in supine and agreeable to therapy w/ encouragement, pain as detailed below. Agreeable to sit up in w/c to work on OOB tolerance until next therapy session. Total assist to don pants, max assist to pull over hips w/ supine bridge. Max assist transfer to EOB and sit<>stand to stedy. Stedy transfer to w/c. Max assist to reposition in w/c. Ended session in w/c, all needs in reach.   Therapy Documentation Precautions:  Precautions Precautions: Fall Precaution Comments: L hemi Restrictions Weight Bearing Restrictions: No Pain: Pain Assessment Pain Scale: 0-10 Pain Score: 7  Pain Type: Acute pain Pain Location: Leg Pain Orientation: Left Pain Descriptors / Indicators: Aching Pain Frequency: Intermittent Pain Onset: On-going Patients Stated Pain Goal: 1 Pain Intervention(s): Medication (See eMAR);Repositioned Multiple Pain Sites: No  Therapy/Group: Individual Therapy  Wayne Griffin 02/14/2018, 1:25 PM

## 2018-02-14 NOTE — Progress Notes (Signed)
Occupational Therapy Session Note  Patient Details  Name: Wayne HaberGary Galanti MRN: [id number] Date of Birth: 08/25/1945  Today's Date: 02/14/2018 OT Individual Time: 1400-1425 OT Individual Time Calculation (min): 25 min    Short Term Goals: Week 3:  OT Short Term Goal 1 (Week 3): STG=LTG (downgraded) secondary to ELOS  Skilled Therapeutic Interventions/Progress Updates:    Pt seen for OT session focusing on functional mobility and sit>stand. Pt sitting up in w/c upon arrival, requesting therapist to assist with return to bed. He voiced complaints of unrated pain in L shoulder, declined making nursing staff aware as pt pre-medicated prior to tx session.  Pt agreeable to work on w/c propulsion before returning to bed. Pt self propelled w/c throughout unit using hemi technique with supervision and min-mod cuing for proper w/c propulsion technique as well as awareness of obstacles on L. Pt propelling ~3975ft for UE strengthening and generalized activity tolerance. Pt returned to room, STEDY used for transfer back to bed, mod-max A for sit>stand into STEDY. Cuing and encouragement for pt to use R hand to assist with management of L UE during mobility. Min A with multimodal cuing for return to supine. Pt able to use hospital bed functions to assist with bed mobility. Pt left in supine with all needs in reach, L UE supported and bed alarm on.   Therapy Documentation Precautions:  Precautions Precautions: Fall Precaution Comments: L hemi Restrictions Weight Bearing Restrictions: No   Therapy/Group: Individual Therapy  Marthann Abshier L 02/14/2018, 7:10 AM

## 2018-02-14 NOTE — Progress Notes (Signed)
[place] PHYSICAL MEDICINE & REHABILITATION PROGRESS NOTE   Subjective/Complaints: Patient seen laying in bed this morning.  He states he slept well overnight.  Discussed with nursing, will discontinue tele-sitter.  ROS: Denies CP, SOB, N/V/D  Objective:   No results found. No results for input(s): WBC, HGB, HCT, PLT in the last 72 hours. No results for input(s): NA, K, CL, CO2, GLUCOSE, BUN, CREATININE, CALCIUM in the last 72 hours.  Intake/Output Summary (Last 24 hours) at 02/14/2018 1304 Last data filed at 02/14/2018 1000 Gross per 24 hour  Intake 442 ml  Output 250 ml  Net 192 ml     Physical Exam: Vital Signs Blood pressure 120/76, pulse 68, temperature 97.9 F (36.6 C), resp. rate 16, height 5\' 11"  (1.803 m), weight 106.3 kg, SpO2 95 %. Constitutional: No distress . Vital signs reviewed. HENT: Normocephalic.  Atraumatic. Eyes: EOMI. No discharge. Cardiovascular: RRR.  No JVD. Respiratory: CTA bilaterally.  Normal effort.  GI: BS +. Non-distended. Musc: No edema or tenderness in extremities. Neurologic: Alert. Motor: 5/5 in RUE/RLE, stable 0/5 left upper extremity proximal distal, unchanged Left lower extremity: Hip flexion, knee extension 2/5, ankle dorsiflexion 1/5, unchanged Skin: No evidence of breakdown, no evidence of rash Psych: Flat  Assessment/Plan: 1. Functional deficits secondary to Left hemiplegia Right subcortical infarct which require 3+ hours per day of interdisciplinary therapy in a comprehensive inpatient rehab setting.  Physiatrist is providing close team supervision and 24 hour management of active medical problems listed below.  Physiatrist and rehab team continue to assess barriers to discharge/monitor patient progress toward functional and medical goals  Care Tool:  Bathing    Body parts bathed by patient: Left arm, Chest, Abdomen, Front perineal area, Right upper leg, Left upper leg, Face   Body parts bathed by helper: Right arm,  Buttocks, Right lower leg, Left lower leg     Bathing assist Assist Level: Moderate Assistance - Patient 50 - 74%     Upper Body Dressing/Undressing Upper body dressing   What is the patient wearing?: Pull over shirt    Upper body assist Assist Level: Moderate Assistance - Patient 50 - 74%    Lower Body Dressing/Undressing Lower body dressing      What is the patient wearing?: Incontinence brief, Pants     Lower body assist Assist for lower body dressing: Maximal Assistance - Patient 25 - 49%     Toileting Toileting    Toileting assist Assist for toileting: 2 Helpers     Transfers Chair/bed transfer  Transfers assist     Chair/bed transfer assist level: Dependent - mechanical lift(stedy)     Locomotion Ambulation   Ambulation assist   Ambulation activity did not occur: Safety/medical concerns  Assist level: 2 helpers Assistive device: Other (comment)(rail) Max distance: 5 ft   Walk 10 feet activity   Assist  Walk 10 feet activity did not occur: Safety/medical concerns        Walk 50 feet activity   Assist Walk 50 feet with 2 turns activity did not occur: Safety/medical concerns         Walk 150 feet activity   Assist Walk 150 feet activity did not occur: Safety/medical concerns         Walk 10 feet on uneven surface  activity   Assist Walk 10 feet on uneven surfaces activity did not occur: Safety/medical concerns         Wheelchair     Assist Will patient use wheelchair at discharge?:  Yes Type of Wheelchair: Manual    Wheelchair assist level: Supervision/Verbal cueing Max wheelchair distance: 50    Wheelchair 50 feet with 2 turns activity    Assist        Assist Level: Supervision/Verbal cueing   Wheelchair 150 feet activity     Assist Wheelchair 150 feet activity did not occur: Safety/medical concerns   Assist Level: Moderate Assistance - Patient 50 - 74%    Medical Problem List and Plan: 1.   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 Aspirin and Plavix therapy 3 weeks then aspirin alone, ? If speech difficulties prior to admission were a TIA  Continue CIR    WHO/PRAFO ordered 2.  DVT Prophylaxis/Anticoagulation: subcutaneous Lovenox. Monitor for any bleeding episodes 3. Pain Management:  Tramadol as needed, added voltaren gel and Knee orthosis- improved pain,  mainly medial joint line  Changed tramadol to 50mg  q6 prn with zanaflex at noc 4. Mood: Ativan DC'd, Klonopin 0.25 3 times daily, remains very anxious nursing gets good results with nasal cannula without oxygen 5. Neuropsych: This patient is?  Fully capable of making decisions onhis own behalf. 6. Skin/Wound Care: Routine skin checks 7. Fluids/Electrolytes/Nutrition:  Routine in and out's  8. Hypertension. Norvasc 10 mg daily. Monitor with increased mobility   Vitals:   02/13/18 1925 02/14/18 0447  BP: 134/71 120/76  Pulse: 64 68  Resp: 19 16  Temp: 97.7 F (36.5 C) 97.9 F (36.6 C)  SpO2: 94% 95%   No improvement with HCTZ, hydralazine 10mg  TID, avoid ACE and Diuretic due to elevated creat, HR low 60s avoid BB  Relatively controlled on 12/26 9. Hyperlipidemia. Lipitor 10. History of tobacco use. Provide counseling  11.  Borderline normocytic anemia:   Hemoglobin 12.9 on 12/16  Continue to monitor, no obvious source of blood loss 12.  Hematuria:?  Resolved 13.  AKI  Creatinine 1.66 on 12/23  Labs ordered for tomorrow  Continue to monitor 14.  Urinary frequency  PVRs relatively low volumes 15.  Acute blood loss anemia  Hemoglobin 12.9 on 12/16  Labs ordered for tomorrow  LOS: 17 days A FACE TO FACE EVALUATION WAS PERFORMED  Jaree Trinka Karis Jubanil Spiro Ausborn 02/14/2018, 1:04 PM

## 2018-02-15 ENCOUNTER — Inpatient Hospital Stay (HOSPITAL_COMMUNITY): Payer: Medicare Other

## 2018-02-15 ENCOUNTER — Inpatient Hospital Stay (HOSPITAL_COMMUNITY): Payer: Medicare Other | Admitting: Physical Therapy

## 2018-02-15 LAB — CBC WITH DIFFERENTIAL/PLATELET
Abs Immature Granulocytes: 0.02 10*3/uL (ref 0.00–0.07)
BASOS ABS: 0.1 10*3/uL (ref 0.0–0.1)
Basophils Relative: 1 %
EOS ABS: 0.5 10*3/uL (ref 0.0–0.5)
Eosinophils Relative: 7 %
HCT: 39 % (ref 39.0–52.0)
Hemoglobin: 13 g/dL (ref 13.0–17.0)
Immature Granulocytes: 0 %
Lymphocytes Relative: 21 %
Lymphs Abs: 1.5 10*3/uL (ref 0.7–4.0)
MCH: 28.3 pg (ref 26.0–34.0)
MCHC: 33.3 g/dL (ref 30.0–36.0)
MCV: 85 fL (ref 80.0–100.0)
Monocytes Absolute: 0.7 10*3/uL (ref 0.1–1.0)
Monocytes Relative: 10 %
NRBC: 0 % (ref 0.0–0.2)
Neutro Abs: 4.3 10*3/uL (ref 1.7–7.7)
Neutrophils Relative %: 61 %
Platelets: 133 10*3/uL — ABNORMAL LOW (ref 150–400)
RBC: 4.59 MIL/uL (ref 4.22–5.81)
RDW: 14.2 % (ref 11.5–15.5)
WBC: 7.1 10*3/uL (ref 4.0–10.5)

## 2018-02-15 LAB — BASIC METABOLIC PANEL
Anion gap: 10 (ref 5–15)
BUN: 34 mg/dL — ABNORMAL HIGH (ref 8–23)
CALCIUM: 9 mg/dL (ref 8.9–10.3)
CO2: 30 mmol/L (ref 22–32)
Chloride: 99 mmol/L (ref 98–111)
Creatinine, Ser: 2.2 mg/dL — ABNORMAL HIGH (ref 0.61–1.24)
GFR calc Af Amer: 33 mL/min — ABNORMAL LOW (ref >60)
GFR calc non Af Amer: 29 mL/min — ABNORMAL LOW (ref >60)
Glucose, Bld: 99 mg/dL (ref 70–99)
Potassium: 3.6 mmol/L (ref 3.5–5.1)
SODIUM: 139 mmol/L (ref 135–145)

## 2018-02-15 NOTE — Progress Notes (Signed)
PHYSICAL MEDICINE & REHABILITATION PROGRESS NOTE   Subjective/Complaints: Patient seen laying in bed this morning.  He states he slept well overnight.  He has questions about bracing.  ROS: Denies CP, SOB, N/V/D  Objective:   No results found. Recent Labs    02/15/18 0537  WBC 7.1  HGB 13.0  HCT 39.0  PLT 133*   Recent Labs    02/15/18 0537  NA 139  K 3.6  CL 99  CO2 30  GLUCOSE 99  BUN 34*  CREATININE 2.20*  CALCIUM 9.0    Intake/Output Summary (Last 24 hours) at 02/15/2018 1121 Last data filed at 02/15/2018 1035 Gross per 24 hour  Intake 360 ml  Output 590 ml  Net -230 ml     Physical Exam: Vital Signs Blood pressure (!) 151/82, pulse 65, temperature 98.3 F (36.8 C), temperature source Oral, resp. rate 18, height 5\' 11"  (1.803 m), weight 106.3 kg, SpO2 95 %. Constitutional: No distress . Vital signs reviewed. HENT: Normocephalic.  Atraumatic. Eyes: EOMI. No discharge. Cardiovascular: RRR.  No JVD. Respiratory: CTA bilaterally.  Normal effort.  GI: BS +. Non-distended. Musc: No edema or tenderness in extremities. Neurologic: Alert. Motor: 5/5 in RUE/RLE, stable 0/5 left upper extremity proximal distal, stable Left lower extremity: Hip flexion, knee extension 2/5, ankle dorsiflexion 1/5, stable Skin: No evidence of breakdown, no evidence of rash Psych: Flat  Assessment/Plan: 1. Functional deficits secondary to Left hemiplegia Right subcortical infarct which require 3+ hours per day of interdisciplinary therapy in a comprehensive inpatient rehab setting.  Physiatrist is providing close team supervision and 24 hour management of active medical problems listed below.  Physiatrist and rehab team continue to assess barriers to discharge/monitor patient progress toward functional and medical goals  Care Tool:  Bathing    Body parts bathed by patient: Left arm, Chest, Abdomen, Front perineal area, Right upper leg, Left upper leg, Face   Body  parts bathed by helper: Right arm, Buttocks, Right lower leg, Left lower leg     Bathing assist Assist Level: Moderate Assistance - Patient 50 - 74%     Upper Body Dressing/Undressing Upper body dressing   What is the patient wearing?: Pull over shirt    Upper body assist Assist Level: Moderate Assistance - Patient 50 - 74%    Lower Body Dressing/Undressing Lower body dressing      What is the patient wearing?: Incontinence brief, Pants     Lower body assist Assist for lower body dressing: Maximal Assistance - Patient 25 - 49%     Toileting Toileting    Toileting assist Assist for toileting: 2 Helpers     Transfers Chair/bed transfer  Transfers assist     Chair/bed transfer assist level: Dependent - mechanical lift(stedy)     Locomotion Ambulation   Ambulation assist   Ambulation activity did not occur: Safety/medical concerns  Assist level: 2 helpers Assistive device: Other (comment)(rail) Max distance: 5 ft   Walk 10 feet activity   Assist  Walk 10 feet activity did not occur: Safety/medical concerns        Walk 50 feet activity   Assist Walk 50 feet with 2 turns activity did not occur: Safety/medical concerns         Walk 150 feet activity   Assist Walk 150 feet activity did not occur: Safety/medical concerns         Walk 10 feet on uneven surface  activity   Assist Walk 10 feet on uneven  surfaces activity did not occur: Safety/medical concerns         Wheelchair     Assist Will patient use wheelchair at discharge?: Yes Type of Wheelchair: Manual    Wheelchair assist level: Supervision/Verbal cueing Max wheelchair distance: 50    Wheelchair 50 feet with 2 turns activity    Assist        Assist Level: Supervision/Verbal cueing   Wheelchair 150 feet activity     Assist Wheelchair 150 feet activity did not occur: Safety/medical concerns   Assist Level: Moderate Assistance - Patient 50 - 74%     Medical Problem List and Plan: 1.  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 Aspirin and Plavix therapy 3 weeks then aspirin alone, ? If speech difficulties prior to admission were a TIA  Continue CIR    WHO/PRAFO  2.  DVT Prophylaxis/Anticoagulation: subcutaneous Lovenox. Monitor for any bleeding episodes 3. Pain Management:  Tramadol as needed, added voltaren gel and Knee orthosis- improved pain,  mainly medial joint line  Changed tramadol to 50mg  q6 prn with zanaflex at noc 4. Mood: Ativan DC'd, Klonopin 0.25 3 times daily, remains very anxious nursing gets good results with nasal cannula without oxygen 5. Neuropsych: This patient is capable of making decisions onhis own behalf. 6. Skin/Wound Care: Routine skin checks 7. Fluids/Electrolytes/Nutrition:  Routine in and out's  8. Hypertension. Norvasc 10 mg daily. Monitor with increased mobility   Vitals:   02/15/18 0504 02/15/18 0955  BP: 132/72 (!) 151/82  Pulse: 65   Resp: 18   Temp: 98.3 F (36.8 C)   SpO2: 95%    No improvement with HCTZ, hydralazine 10mg  TID, avoid ACE and Diuretic due to elevated creat, HR low 60s avoid BB  Relatively controlled on 12/26 9. Hyperlipidemia. Lipitor 10. History of tobacco use. Provide counseling  11.  Borderline normocytic anemia:   Hemoglobin 12.9 on 12/16  Continue to monitor, no obvious source of blood loss 12.  Hematuria:?  Resolved 13.  AKI  Creatinine 2.20 on 12/27  Encourage fluids  Labs ordered for Monday  Continue to monitor 14.  Urinary frequency  PVRs relatively low volumes 15.  Acute blood loss anemia: Resolved  Hemoglobin 13.0 on 12/27  LOS: 18 days A FACE TO FACE EVALUATION WAS PERFORMED  Leticia Mcdiarmid Karis Jubanil Elisheba Mcdonnell 02/15/2018, 11:21 AM

## 2018-02-15 NOTE — Progress Notes (Signed)
Physical Therapy Session Note  Patient Details  Name: Wayne Griffin MRN: [id number] Date of Birth: 1945-04-08  Today's Date: 02/15/2018 PT Individual Time: 1300-1345 PT Individual Time Calculation (min): 45 min   Short Term Goals: Week 3:  PT Short Term Goal 1 (Week 3): =LTGs due to ELOS  Skilled Therapeutic Interventions/Progress Updates:    Patient received in bed, very flat and interacting minimally with therapist, but willing to participate in therapy session following max encouragement from PT. MaxA for supine to sit and Min-ModA to gait balance at EOB; able to complete squat-pivot transfer to right with heavy maxAx1 and min guard of second person for safety, then maxA to reposition in chair. Able to self-propel WC approximately 71f using L UE and LE but required ModA for straight line navigation in hallway. Performed 2 sit to stands at railing in hallway with MStar max cues for full trunk extension and upright posture, and very self-limiting/sat back down after standing for no longer than 1-2 minutes at a time. Adamantly refused staying up in the chair despite encouragement and multiple offers from PT. [facility] for squat pivot transfer back to the left, and MaxA to return to bed. Performed functional stretching as tolerated to L LE due to stiffness/tightness from keeping this leg in an abducted, externally rotated position with knee flexed. He was left in bed with all needs met and bed alarm active this afternoon, positioned to comfort.   Therapy Documentation Precautions:  Precautions Precautions: Fall Precaution Comments: L hemi Restrictions Weight Bearing Restrictions: No General:  Pain: Pain Assessment Pain Scale: Faces Pain Score: 2  Faces Pain Scale: Hurts even more Pain Type: Acute pain Pain Location: Leg Pain Orientation: Left Pain Descriptors / Indicators: Aching;Sore Pain Onset: On-going Patients Stated Pain Goal: 0 Pain  Intervention(s): Repositioned;Ambulation/increased activity Multiple Pain Sites: No    Therapy/Group: Individual Therapy  KDeniece ReePT, DPT, CBIS  Supplemental Physical Therapist [facility]   Pager [phone] Rehab Office [phone]  02/15/2018, 2:36 PM

## 2018-02-15 NOTE — Progress Notes (Signed)
Occupational Therapy Session Note  Patient Details  Name: Ernest HaberGary Milburn MRN: [id number] Date of Birth: 1945-10-25  Today's Date: 02/15/2018 OT Individual Time: 1100-1200 OT Individual Time Calculation (min): 60 min    Short Term Goals: Week 3:  OT Short Term Goal 1 (Week 3): STG=LTG (downgraded) secondary to ELOS  Skilled Therapeutic Interventions/Progress Updates:    Pt resting in bed upon arrival.  Pt sated he was feeling depressed.  CSW aware. Pt engaged in continued BADL retraining at bed level and sitting EOB.  LB bathing/dressing at bed level with max A to complete tasks.  Pt completed UB bathing/dressing tasks seated EOB with min A for sitting balance.  Pt required min A for bathing and mod A for dressing with pullover shirt.  Pt required min verbal cues for sequencing.  Pt requires max verbal cues and tactile cues for sitting balance EOB.  Pt requires max A for bed mobility and repositiong in bed.  Pt remained in bed with all needs within reach and bed alarm activated.   Therapy Documentation Precautions:  Precautions Precautions: Fall Precaution Comments: L hemi Restrictions Weight Bearing Restrictions: No Pain: Pain Assessment Pain Scale: Faces Pain Score: 7/10  Faces Pain Scale: Hurts even more Pain Type: Acute pain Pain Location: Leg Pain Orientation: Left Pain Descriptors / Indicators: Aching;Sore Pain Onset: On-going Patients Stated Pain Goal: 0 Pain Intervention(s): Repositioned; pain meds admin prior to therapy Multiple Pain Sites: No     Therapy/Group: Individual Therapy  Rich BraveLanier, Everado Pillsbury Chappell 02/15/2018, 3:01 PM

## 2018-02-16 ENCOUNTER — Inpatient Hospital Stay (HOSPITAL_COMMUNITY): Payer: Medicare Other

## 2018-02-16 NOTE — Progress Notes (Signed)
[place] PHYSICAL MEDICINE & REHABILITATION PROGRESS NOTE   Subjective/Complaints: Patient seen lying in bed this morning.  He states he slept well overnight.  He states he is waiting to begin therapies today.  He has WHO in place but no PRAFO.  Discussed again.  ROS: Denies CP, SOB, N/V/D  Objective:   No results found. Recent Labs    02/15/18 0537  WBC 7.1  HGB 13.0  HCT 39.0  PLT 133*   Recent Labs    02/15/18 0537  NA 139  K 3.6  CL 99  CO2 30  GLUCOSE 99  BUN 34*  CREATININE 2.20*  CALCIUM 9.0    Intake/Output Summary (Last 24 hours) at 02/16/2018 0904 Last data filed at 02/15/2018 1735 Gross per 24 hour  Intake 120 ml  Output 390 ml  Net -270 ml     Physical Exam: Vital Signs Blood pressure 130/75, pulse 63, temperature 98.1 F (36.7 C), temperature source Oral, resp. rate 17, height 5\' 11"  (1.803 m), weight 106.3 kg, SpO2 97 %. Constitutional: No distress . Vital signs reviewed. HENT: Normocephalic.  Atraumatic. Eyes: EOMI. No discharge. Cardiovascular: RRR.  No JVD. Respiratory: CTA bilaterally.  Normal effort.  GI: BS +. Non-distended. Musc: No edema or tenderness in extremities. Neurologic: Alert. Motor: 5/5 in RUE/RLE, stable 0/5 left upper extremity proximal distal, unchanged Left lower extremity: Hip flexion, knee extension 2/5, ankle dorsiflexion 1/5, unchanged Skin: No evidence of breakdown, no evidence of rash Psych: Flat  Assessment/Plan: 1. Functional deficits secondary to Left hemiplegia Right subcortical infarct which require 3+ hours per day of interdisciplinary therapy in a comprehensive inpatient rehab setting.  Physiatrist is providing close team supervision and 24 hour management of active medical problems listed below.  Physiatrist and rehab team continue to assess barriers to discharge/monitor patient progress toward functional and medical goals  Care Tool:  Bathing    Body parts bathed by patient: Left arm, Chest,  Abdomen, Front perineal area, Right upper leg, Left upper leg, Face   Body parts bathed by helper: Right arm, Buttocks, Right lower leg, Left lower leg     Bathing assist Assist Level: Moderate Assistance - Patient 50 - 74%     Upper Body Dressing/Undressing Upper body dressing   What is the patient wearing?: Pull over shirt    Upper body assist Assist Level: Moderate Assistance - Patient 50 - 74%    Lower Body Dressing/Undressing Lower body dressing      What is the patient wearing?: Incontinence brief, Pants     Lower body assist Assist for lower body dressing: Maximal Assistance - Patient 25 - 49%     Toileting Toileting    Toileting assist Assist for toileting: 2 Helpers     Transfers Chair/bed transfer  Transfers assist     Chair/bed transfer assist level: 2 Helpers     Locomotion Ambulation   Ambulation assist   Ambulation activity did not occur: Safety/medical concerns  Assist level: 2 helpers Assistive device: Other (comment)(rail) Max distance: 5 ft   Walk 10 feet activity   Assist  Walk 10 feet activity did not occur: Safety/medical concerns        Walk 50 feet activity   Assist Walk 50 feet with 2 turns activity did not occur: Safety/medical concerns         Walk 150 feet activity   Assist Walk 150 feet activity did not occur: Safety/medical concerns         Walk 10 feet  on uneven surface  activity   Assist Walk 10 feet on uneven surfaces activity did not occur: Safety/medical concerns         Wheelchair     Assist Will patient use wheelchair at discharge?: Yes Type of Wheelchair: Manual    Wheelchair assist level: Moderate Assistance - Patient 50 - 74% Max wheelchair distance: 50    Wheelchair 50 feet with 2 turns activity    Assist        Assist Level: Moderate Assistance - Patient 50 - 74%   Wheelchair 150 feet activity     Assist Wheelchair 150 feet activity did not occur:  Safety/medical concerns   Assist Level: Moderate Assistance - Patient 50 - 74%    Medical Problem List and Plan: 1.  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 Aspirin and Plavix therapy 3 weeks then aspirin alone, ? If speech difficulties prior to admission were a TIA  Continue CIR    WHO/PRAFO-discussed again 2.  DVT Prophylaxis/Anticoagulation: subcutaneous Lovenox. Monitor for any bleeding episodes 3. Pain Management:  Tramadol as needed, added voltaren gel and Knee orthosis- improved pain,  mainly medial joint line  Changed tramadol to 50mg  q6 prn with zanaflex at noc 4. Mood: Ativan DC'd, Klonopin 0.25 3 times daily, remains very anxious nursing gets good results with nasal cannula without oxygen 5. Neuropsych: This patient is capable of making decisions onhis own behalf. 6. Skin/Wound Care: Routine skin checks 7. Fluids/Electrolytes/Nutrition:  Routine in and out's  8. Hypertension. Norvasc 10 mg daily. Monitor with increased mobility   Vitals:   02/15/18 2321 02/16/18 0435  BP: 133/83 130/75  Pulse: 67 63  Resp:  17  Temp:  98.1 F (36.7 C)  SpO2:  97%   No improvement with HCTZ, hydralazine 10mg  TID, avoid ACE and Diuretic due to elevated creat, HR low 60s avoid BB  Controlled on 12/28 9. Hyperlipidemia. Lipitor 10. History of tobacco use. Provide counseling  11.  Borderline normocytic anemia:   Hemoglobin 12.9 on 12/16  Continue to monitor, no obvious source of blood loss 12.  Hematuria:?  Resolved 13.  AKI  Creatinine 2.20 on 12/27  Encourage fluids  Labs ordered for Monday  Continue to monitor 14.  Urinary frequency  PVRs relatively low volumes 15.  Acute blood loss anemia: Resolved  Hemoglobin 13.0 on 12/27  LOS: 19 days A FACE TO FACE EVALUATION WAS PERFORMED  Ankit Karis Jubanil Patel 02/16/2018, 9:04 AM

## 2018-02-16 NOTE — Progress Notes (Signed)
Occupational Therapy Session Note  Patient Details  Name: Wayne Griffin MRN: [id number] Date of Birth: [birth year]  Today's Date: 02/16/2018 OT Individual Time: 1300-1325 OT Individual Time Calculation (min): 25 min    Short Term Goals: Week 3:  OT Short Term Goal 1 (Week 3): STG=LTG (downgraded) secondary to ELOS  Skilled Therapeutic Interventions/Progress Updates:    Pt resting in bed upon arrival.  Pt had not eaten lunch and stated that he wasn't hungry.  Pt acknowledged that he wasn't eating much because he didn't feel like it.  Offered pt alternatives (ice cream, yogurt, pudding, etc.) but pt refused.  OT intervention with focus on bed mobility and LUE exersices (see below).  Pt requires max A for supine<>sit EOB and min A for sitting balance.  Pt fatigues quickly when seated and requires increased assistance.  Pt remained in bed with all needs within reach and bed alarm activated.   Therapy Documentation Precautions:  Precautions Precautions: Fall Precaution Comments: L hemi Restrictions Weight Bearing Restrictions: No   Pain: Pain Assessment Pain Score: 3 L shoulder; repositioned   Exercises: General Exercises - Upper Extremity Shoulder Flexion: AAROM;Left;10 reps;Supine Shoulder Horizontal ABduction: AAROM;Left;10 reps;Supine Shoulder Horizontal ADduction: AAROM;Left;10 reps;Supine Elbow Flexion: PROM;Left;10 reps;Supine Elbow Extension: PROM;Left;10 reps;Supine Wrist Flexion: PROM;Left;10 reps;Supine Wrist Extension: PROM;Left;10 reps;Supine Other Treatments:     Therapy/Group: Individual Therapy  Rich BraveLanier, Christana Angelica Chappell 02/16/2018, 1:31 PM

## 2018-02-17 NOTE — Progress Notes (Signed)
[place] PHYSICAL MEDICINE & REHABILITATION PROGRESS NOTE   Subjective/Complaints: Patient seen lying in bed this morning.  He states he slept well overnight.  His braces are in place.  ROS: Denies CP, SOB, N/V/D  Objective:   No results found. Recent Labs    02/15/18 0537  WBC 7.1  HGB 13.0  HCT 39.0  PLT 133*   Recent Labs    02/15/18 0537  NA 139  K 3.6  CL 99  CO2 30  GLUCOSE 99  BUN 34*  CREATININE 2.20*  CALCIUM 9.0    Intake/Output Summary (Last 24 hours) at 02/17/2018 0807 Last data filed at 02/17/2018 0500 Gross per 24 hour  Intake 1062 ml  Output 750 ml  Net 312 ml     Physical Exam: Vital Signs Blood pressure 125/68, pulse 63, temperature 98.3 F (36.8 C), temperature source Oral, resp. rate 16, height 5\' 11"  (1.803 m), weight 106.3 kg, SpO2 95 %. Constitutional: No distress . Vital signs reviewed. HENT: Normocephalic.  Atraumatic. Eyes: EOMI. No discharge. Cardiovascular: RRR. No JVD. Respiratory: CTA bilaterally. Normal effort.  GI: BS +. Non-distended. Musc: No edema or tenderness in extremities. Neurologic: Alert. Motor: 5/5 in RUE/RLE, stable 0/5 left upper extremity proximal distal, stable Left lower extremity: Hip flexion, knee extension 2/5, ankle dorsiflexion 1/5, stable Skin: No evidence of breakdown, no evidence of rash Psych: Flat  Assessment/Plan: 1. Functional deficits secondary to Left hemiplegia Right subcortical infarct which require 3+ hours per day of interdisciplinary therapy in a comprehensive inpatient rehab setting.  Physiatrist is providing close team supervision and 24 hour management of active medical problems listed below.  Physiatrist and rehab team continue to assess barriers to discharge/monitor patient progress toward functional and medical goals  Care Tool:  Bathing    Body parts bathed by patient: Left arm, Chest, Abdomen, Front perineal area, Right upper leg, Left upper leg, Face   Body parts bathed  by helper: Right arm, Buttocks, Right lower leg, Left lower leg     Bathing assist Assist Level: Moderate Assistance - Patient 50 - 74%     Upper Body Dressing/Undressing Upper body dressing   What is the patient wearing?: Pull over shirt    Upper body assist Assist Level: Moderate Assistance - Patient 50 - 74%    Lower Body Dressing/Undressing Lower body dressing      What is the patient wearing?: Incontinence brief, Pants     Lower body assist Assist for lower body dressing: Maximal Assistance - Patient 25 - 49%     Toileting Toileting    Toileting assist Assist for toileting: 2 Helpers     Transfers Chair/bed transfer  Transfers assist     Chair/bed transfer assist level: 2 Helpers     Locomotion Ambulation   Ambulation assist   Ambulation activity did not occur: Safety/medical concerns  Assist level: 2 helpers Assistive device: Other (comment)(rail) Max distance: 5 ft   Walk 10 feet activity   Assist  Walk 10 feet activity did not occur: Safety/medical concerns        Walk 50 feet activity   Assist Walk 50 feet with 2 turns activity did not occur: Safety/medical concerns         Walk 150 feet activity   Assist Walk 150 feet activity did not occur: Safety/medical concerns         Walk 10 feet on uneven surface  activity   Assist Walk 10 feet on uneven surfaces activity did not occur:  Safety/medical concerns         Wheelchair     Assist Will patient use wheelchair at discharge?: Yes Type of Wheelchair: Manual    Wheelchair assist level: Moderate Assistance - Patient 50 - 74% Max wheelchair distance: 50    Wheelchair 50 feet with 2 turns activity    Assist        Assist Level: Moderate Assistance - Patient 50 - 74%   Wheelchair 150 feet activity     Assist Wheelchair 150 feet activity did not occur: Safety/medical concerns   Assist Level: Moderate Assistance - Patient 50 - 74%    Medical  Problem List and Plan: 1.  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 Aspirin and Plavix therapy 3 weeks then aspirin alone, ? If speech difficulties prior to admission were a TIA  Continue CIR    WHO/PRAFO 2.  DVT Prophylaxis/Anticoagulation: subcutaneous Lovenox. Monitor for any bleeding episodes 3. Pain Management:  Tramadol as needed, added voltaren gel and Knee orthosis- improved pain,  mainly medial joint line  Changed tramadol to 50mg  q6 prn with zanaflex at noc 4. Mood: Ativan DC'd, Klonopin 0.25 3 times daily, remains very anxious nursing gets good results with nasal cannula without oxygen 5. Neuropsych: This patient is capable of making decisions onhis own behalf. 6. Skin/Wound Care: Routine skin checks 7. Fluids/Electrolytes/Nutrition:  Routine in and out's  8. Hypertension. Norvasc 10 mg daily. Monitor with increased mobility   Vitals:   02/16/18 2214 02/17/18 0503  BP: (!) 141/83 125/68  Pulse: 65 63  Resp:  16  Temp:  98.3 F (36.8 C)  SpO2: 96% 95%   No improvement with HCTZ, hydralazine 10mg  TID, avoid ACE and Diuretic due to elevated creat, HR low 60s avoid BB  Labile on 12/29 9. Hyperlipidemia. Lipitor 10. History of tobacco use. Provide counseling  11.  Borderline normocytic anemia:   Hemoglobin 12.9 on 12/16  Continue to monitor, no obvious source of blood loss 12.  Hematuria:?  Resolved 13.  AKI  Creatinine 2.20 on 12/27  Encourage fluids  Labs ordered for tomorrow  Continue to monitor 14.  Urinary frequency  PVRs relatively low volumes 15.  Acute blood loss anemia: Resolved  Hemoglobin 13.0 on 12/27  LOS: 20 days A FACE TO FACE EVALUATION WAS PERFORMED  Sheryl Saintil Karis JubaAnil Bertil Brickey 02/17/2018, 8:07 AM

## 2018-02-18 ENCOUNTER — Inpatient Hospital Stay (HOSPITAL_COMMUNITY): Payer: Medicare Other

## 2018-02-18 LAB — CREATININE, SERUM
Creatinine, Ser: 1.8 mg/dL — ABNORMAL HIGH (ref 0.61–1.24)
GFR calc Af Amer: 43 mL/min — ABNORMAL LOW (ref >60)
GFR calc non Af Amer: 37 mL/min — ABNORMAL LOW (ref >60)

## 2018-02-18 MED ORDER — CITALOPRAM HYDROBROMIDE 10 MG PO TABS: 10.0000 mg | ORAL_TABLET | Freq: Every day | ORAL | Status: AC

## 2018-02-18 MED ORDER — ACETAMINOPHEN 325 MG PO TABS: 650.0000 mg | ORAL_TABLET | ORAL | Status: AC | PRN

## 2018-02-18 MED ORDER — CLONAZEPAM 0.25 MG PO TBDP: 0.2500 mg | ORAL_TABLET | Freq: Three times a day (TID) | ORAL | 0 refills | Status: AC

## 2018-02-18 MED ORDER — SENNOSIDES-DOCUSATE SODIUM 8.6-50 MG PO TABS: 2.0000 | ORAL_TABLET | Freq: Two times a day (BID) | ORAL | Status: AC

## 2018-02-18 MED ORDER — PANTOPRAZOLE SODIUM 40 MG PO TBEC: 40.0000 mg | DELAYED_RELEASE_TABLET | Freq: Every day | ORAL | Status: AC

## 2018-02-18 MED ORDER — TIZANIDINE HCL 2 MG PO TABS: 2.0000 mg | ORAL_TABLET | Freq: Every day | ORAL | 0 refills | Status: AC

## 2018-02-18 MED ORDER — TRAMADOL HCL 50 MG PO TABS: 50.0000 mg | ORAL_TABLET | Freq: Four times a day (QID) | ORAL | 0 refills | Status: AC | PRN

## 2018-02-18 MED ORDER — HYDRALAZINE HCL 10 MG PO TABS: 10.0000 mg | ORAL_TABLET | Freq: Three times a day (TID) | ORAL | Status: AC

## 2018-02-18 NOTE — Discharge Summary (Signed)
discharge summary job # [id number]

## 2018-02-18 NOTE — Progress Notes (Signed)
Social Work Patient ID: Wayne HaberGary Barney, male   DOB: [birth year], 72 y.o.   MRN: [id number] Gave pt bed offers and spoke with son-[name] who has [facility]. Will work on transfer for tomorrow. Pt is in agreement with this plan.

## 2018-02-18 NOTE — Progress Notes (Signed)
[place] PHYSICAL MEDICINE & REHABILITATION PROGRESS NOTE   Subjective/Complaints: Patient seen laying in bed this morning.  He states he slept well overnight.  When discussed discharge date tomorrow patient appears apathetic.  ROS: Denies CP, SOB, N/V/D  Objective:   No results found. No results for input(s): WBC, HGB, HCT, PLT in the last 72 hours. Recent Labs    02/18/18 0557  CREATININE 1.80*    Intake/Output Summary (Last 24 hours) at 02/18/2018 0946 Last data filed at 02/18/2018 0820 Gross per 24 hour  Intake 720 ml  Output 1150 ml  Net -430 ml     Physical Exam: Vital Signs Blood pressure 137/66, pulse 61, temperature 97.7 F (36.5 C), temperature source Oral, resp. rate 15, height 5\' 11"  (1.803 m), weight 106.3 kg, SpO2 93 %. Constitutional: No distress . Vital signs reviewed. HENT: Normocephalic.  Atraumatic. Eyes: EOMI. No discharge. Cardiovascular: RRR.  No JVD. Respiratory: CTA bilaterally.  Normal effort.  GI: BS +. Non-distended. Musc: No edema or tenderness in extremities. Neurologic: Alert. Motor: 5/5 in RUE/RLE, stable 0/5 left upper extremity proximal distal, unchanged Left lower extremity: Hip flexion, knee extension 2/5, ankle dorsiflexion 1/5, unchanged Skin: No evidence of breakdown, no evidence of rash Psych: Flat  Assessment/Plan: 1. Functional deficits secondary to Left hemiplegia Right subcortical infarct which require 3+ hours per day of interdisciplinary therapy in a comprehensive inpatient rehab setting.  Physiatrist is providing close team supervision and 24 hour management of active medical problems listed below.  Physiatrist and rehab team continue to assess barriers to discharge/monitor patient progress toward functional and medical goals  Care Tool:  Bathing    Body parts bathed by patient: Left arm, Chest, Abdomen, Front perineal area, Right upper leg, Left upper leg, Face   Body parts bathed by helper: Right arm, Buttocks,  Right lower leg, Left lower leg     Bathing assist Assist Level: Moderate Assistance - Patient 50 - 74%     Upper Body Dressing/Undressing Upper body dressing   What is the patient wearing?: Pull over shirt    Upper body assist Assist Level: Moderate Assistance - Patient 50 - 74%    Lower Body Dressing/Undressing Lower body dressing      What is the patient wearing?: Incontinence brief, Pants     Lower body assist Assist for lower body dressing: Maximal Assistance - Patient 25 - 49%     Toileting Toileting    Toileting assist Assist for toileting: 2 Helpers     Transfers Chair/bed transfer  Transfers assist     Chair/bed transfer assist level: 2 Helpers     Locomotion Ambulation   Ambulation assist   Ambulation activity did not occur: Safety/medical concerns  Assist level: 2 helpers Assistive device: Other (comment)(rail) Max distance: 5 ft   Walk 10 feet activity   Assist  Walk 10 feet activity did not occur: Safety/medical concerns        Walk 50 feet activity   Assist Walk 50 feet with 2 turns activity did not occur: Safety/medical concerns         Walk 150 feet activity   Assist Walk 150 feet activity did not occur: Safety/medical concerns         Walk 10 feet on uneven surface  activity   Assist Walk 10 feet on uneven surfaces activity did not occur: Safety/medical concerns         Wheelchair     Assist Will patient use wheelchair at discharge?: Yes Type  of Wheelchair: Manual    Wheelchair assist level: Moderate Assistance - Patient 50 - 74% Max wheelchair distance: 50    Wheelchair 50 feet with 2 turns activity    Assist        Assist Level: Moderate Assistance - Patient 50 - 74%   Wheelchair 150 feet activity     Assist Wheelchair 150 feet activity did not occur: Safety/medical concerns   Assist Level: Moderate Assistance - Patient 50 - 74%    Medical Problem List and Plan: 1.  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 Aspirin and Plavix therapy 3 weeks then aspirin alone, ? If speech difficulties prior to admission were a TIA  Continue CIR    WHO/PRAFO 2.  DVT Prophylaxis/Anticoagulation: subcutaneous Lovenox. Monitor for any bleeding episodes 3. Pain Management:  Tramadol as needed, added voltaren gel and Knee orthosis- improved pain,  mainly medial joint line  Changed tramadol to 50mg  q6 prn with zanaflex at noc 4. Mood: Ativan DC'd, Klonopin 0.25 3 times daily, remains very anxious nursing gets good results with nasal cannula without oxygen 5. Neuropsych: This patient is capable of making decisions onhis own behalf. 6. Skin/Wound Care: Routine skin checks 7. Fluids/Electrolytes/Nutrition:  Routine in and out's  8. Hypertension. Norvasc 10 mg daily. Monitor with increased mobility   Vitals:   02/17/18 2035 02/18/18 0516  BP: 126/70 137/66  Pulse: 68 61  Resp: 16 15  Temp: 98.1 F (36.7 C) 97.7 F (36.5 C)  SpO2: 96% 93%   No improvement with HCTZ, hydralazine 10mg  TID, avoid ACE and Diuretic due to elevated creat, HR low 60s avoid BB  Relatively controlled on 12/30 9. Hyperlipidemia. Lipitor 10. History of tobacco use. Provide counseling  11.  Borderline normocytic anemia: Resolved  Hemoglobin 13.0 on 12/27  Continue to monitor 12.  Hematuria:?  Resolved 13.  AKI  Creatinine 1.82 on 12/30  Continue to monitor 14.  Urinary frequency  PVRs relatively low volumes  LOS: 21 days A FACE TO FACE EVALUATION WAS PERFORMED  Wayne Griffin Wayne Griffin Wayne Griffin 02/18/2018, 9:46 AM

## 2018-02-18 NOTE — Progress Notes (Signed)
Occupational Therapy Session Note  Patient Details  Name: Wayne HaberGary Griffin MRN: [id number] Date of Birth: 18-Jun-1945  Today's Date: 02/18/2018 OT Individual Time: 1000-1055 OT Individual Time Calculation (min): 55 min    Short Term Goals: Week 3:  OT Short Term Goal 1 (Week 3): STG=LTG (downgraded) secondary to ELOS  Skilled Therapeutic Interventions/Progress Updates:    Pt resting in bed upon arrival.  Pt declined OOB activities but agreeable to sitting EOB for LUE NMR.  OT intervention with focus on bed mobility, sitting balance, activity tolerance, and LUE NMR (see below). Pt initially requires min A for sitting balance fading to mod A when fatigued.  Pt with trance L shoulder horizontal adduction. Pt required max A for supine<>sit EOB and min A for repositionig in bed with pt pushing through RLE and pulling on head board with RUE.  Pt remained in bed with all needs within reach and bed alarm activated.   Therapy Documentation Precautions:  Precautions Precautions: Fall Precaution Comments: L hemi Restrictions Weight Bearing Restrictions: No Pain: Pain Assessment Pain Scale: 0-10 Pain Score: 0-No pain   Other Treatments: Treatments Neuromuscular Facilitation: Left;Upper Extremity;Activity to increase motor control;Activity to increase sustained activation Weight Bearing Technique Weight Bearing Technique: Yes LUE Weight Bearing Technique: Forearm seated;Extended arm seated   Therapy/Group: Individual Therapy  Rich BraveLanier, Ladelle Teodoro Chappell 02/18/2018, 10:57 AM

## 2018-02-18 NOTE — Progress Notes (Signed)
Social Work  Discharge Note  The overall goal for the admission was met for:   Discharge location: Yes-[facility]  Length of Stay: Yes-21 DAYS  Discharge activity level: Yes-MIN ASSIST LEVEL  Home/community participation: Yes  Services provided included: MD, RD, PT, OT, SLP, RN, CM, Pharmacy, Neuropsych and SW  Financial Services: Medicare  Follow-up services arranged: Other: NHP  Comments (or additional information):[facility] CAN NOT PROVIDE 24 HR CARE DUE TO WORKING. NEEDS MORE REHAB.  Patient/Family verbalized understanding of follow-up arrangements: Yes  Individual responsible for coordination of the follow-up plan: [name]  Confirmed correct DME delivered: Elease Hashimoto 02/18/2018    Elease Hashimoto

## 2018-02-18 NOTE — Discharge Summary (Signed)
NAMErnest Griffin: Griffin, Wayne MEDICAL RECORD [id number] ACCOUNT [id number] DATE OF BIRTH:May 11, 1945 FACILITY: [facility] LOCATION: [facility] PHYSICIAN:ANDREW Wynn BankerKIRSTEINS, MD  DISCHARGE SUMMARY  DATE OF DISCHARGE:  02/19/2018  ADMIT DATE:  01/28/2018  DISCHARGE DATE:  02/19/2018  DISCHARGE DIAGNOSES: 1.  Right basal ganglia corona radiata infarction. 2.  Subcutaneous Lovenox for deep venous thrombosis prophylaxis. 3  Pain management. 4.  Mood. 5.  Hypertension. 6.  Hyperlipidemia. 7.  History of tobacco abuse.   8.  Normocytic anemia. 9.  Acute kidney injury.  HOSPITAL COURSE:  This is a 72 year old right-handed male with history of hypertension, tobacco abuse, on no prescription medications.  Lives with his son and independent prior to admission, used a cane son.  Son works during the day.  Presented  01/24/2018 with left facial droop, left-sided weakness, slurred speech.  Cranial CT scan unremarkable for acute process per report, there was some question of focal hyperdensity at the right MCA bifurcation.  The patient did receive tPA.  CT angiogram of  head and neck negative.  No large vessel occlusion.  MRI showed acute subacute nonhemorrhagic infarction posterior right lentiform nucleus posterior limb of right internal capsule.  Echocardiogram with ejection fraction of 60%, grade I diastolic  dysfunction.  Neurology followup.  Maintained on aspirin and Plavix.  Subcutaneous Lovenox for DVT prophylaxis.  Tolerating a mechanical soft diet.  The patient was admitted for comprehensive rehabilitation program.  PAST MEDICAL HISTORY:  See discharge diagnoses.  SOCIAL HISTORY:  Lives with son, independent prior to admission using a cane.  FUNCTIONAL STATUS:  Upon admission to rehab services was +2 physical assist sit to stand, max assist squat pivot transfers, mod/max assist with ADLs.  PHYSICAL EXAMINATION: VITAL SIGNS:  Blood pressure 174/82, pulse 63, temperature 98, respirations 18. GENERAL:   Alert male in no acute distress. HEENT:  EOMs intact. NECK:  Supple, nontender, no JVD. CARDIOVASCULAR:  Rate controlled. ABDOMEN:  Soft, nontender, good bowel sounds. LUNGS:  Clear to auscultation without wheeze.  Mild dysarthria of speech, but fully intelligible.  Dense left-sided weakness.  REHABILITATION HOSPITAL COURSE:  The patient was admitted to inpatient rehabilitation services.  Therapies initiated on a 3-hour daily basis, consisting of physical therapy, occupational therapy, speech therapy and rehabilitation nursing.  The following  issues were addressed during patient's rehabilitation stay.  Pertaining to the patient's right basal ganglia corona radiata infarction, remained stable.  Aspirin and Plavix x3 weeks, then aspirin alone.  Follow up neurology services.  Subcutaneous  Lovenox for DVT prophylaxis during his hospital stay.  No bleeding episodes.  Pain management with use of tramadol as needed as well as Voltaren gel and nighttime Zanaflex.  Blood pressure is controlled on Norvasc.  Mood stabilization with Celexa and  Klonopin.  He was attending full therapies.  Hyperlipidemia with Lipitor.  He did have a history of tobacco abuse.  Received full counts regards to cessation of nicotine products.  The patient received weekly collaborative interdisciplinary team  conferences to discuss estimated length of stay, family teaching, any barriers to discharge.  Performed bed exercises, max assist for supine to sit, min mod assist for gait, balance.  Able to complete squat pivot transfers to the right with heavy max  assist.  Self-propel his wheelchair 50 feet.  Performed 2 sit to stands at railing in the hallway max assist.  Needed max assist for ADLs.  Due to limited assistance at home, it is felt skilled nursing facility was needed, bed becoming available  02/19/2018.  DISCHARGE MEDICATIONS:  Included  Norvasc 10 mg p.o. daily, aspirin 81 mg p.o. daily, Lipitor 40 mg p.o. daily, Celexa 10 mg  p.o. daily, Klonopin 0.25 mg p.o. t.i.d., Plavix 75 mg p.o. daily, Voltaren gel 2 grams 4 times a day to affected area,  hydralazine 10 mg p.o. q.8h., Plavix 75 mg p.o. daily, Klonopin 0.25 mg p.o. t.i.d., Celexa 10 mg p.o. daily, Lipitor 40 mg p.o. daily, Protonix 40 mg p.o. daily, Zanaflex 2 mg p.o. at bedtime, Ultram 50 mg every 6 hours as needed for pain.  DIET:  Mechanical soft, thin liquids.  FOLLOWUP:  The patient would follow up with Dr. Claudette LawsAndrew Kirsteins at the outpatient rehab center as directed; Dr. Delia HeadyPramod Sethi, call for appointment.  SPECIAL INSTRUCTIONS:  Continue aspirin and Plavix x2 more weeks, then aspirin alone.  TN/NUANCE D:02/18/2018 T:02/18/2018 JOB:[id number]

## 2018-02-18 NOTE — Progress Notes (Signed)
Physical Therapy Session Note  Patient Details  Name: Ernest HaberGary Iannelli MRN: [id number] Date of Birth: [birth year]  Today's Date: 02/18/2018 PT Individual Time: 0930-1000 PT Individual Time Calculation (min): 30 min   Short Term Goals: Week 3:  PT Short Term Goal 1 (Week 3): =LTGs due to ELOS  Skilled Therapeutic Interventions/Progress Updates:    Pt supine in bed upon PT arrival, agreeable to therapy tx and denies pain. Pt declines OOB activity this session, reports "I don't want to get up." Pt performed bed level exercises this session for strengthening and neuro re-ed including 2 x 10 each: L active assisted hip flexion, R LE heel slides, SAQ B LEs, bridges, active assisted L hip abduction, R hip abduction. Therapist performed L LE stretches to hamstrings and hip for tone management. Pt left supine in bed at end of session with needs in reach and bed alarm set.   Therapy Documentation Precautions:  Precautions Precautions: Fall Precaution Comments: L hemi Restrictions Weight Bearing Restrictions: No   Therapy/Group: Individual Therapy  Cresenciano GenreEmily van Schagen, PT, DPT 02/18/2018, 7:29 AM

## 2018-02-18 NOTE — Progress Notes (Signed)
Patient scanned for 223 before voiding. After voiding 150 patient scanned for 127. Patient is refusing for staff to cath him and is very upset that this is being done. At one point he became very upset and had to be redirected by Clinical research associatewriter, but still refuses for staff to cath him. Not easily redirected at this time. Writer will report to oncoming nurse.

## 2018-02-19 ENCOUNTER — Inpatient Hospital Stay (HOSPITAL_COMMUNITY): Payer: Medicare Other | Admitting: Physical Therapy

## 2018-02-19 ENCOUNTER — Inpatient Hospital Stay (HOSPITAL_COMMUNITY): Payer: Medicare Other

## 2018-02-19 MED ORDER — DICLOFENAC SODIUM 1 % TD GEL: 2.0000 g | Freq: Four times a day (QID) | TRANSDERMAL | Status: AC

## 2018-02-19 NOTE — Plan of Care (Signed)
  Problem: Consults Goal: RH STROKE PATIENT EDUCATION Description See Patient Education module for education specifics  Outcome: Progressing   Problem: RH BOWEL ELIMINATION Goal: RH STG MANAGE BOWEL WITH ASSISTANCE Description STG Manage Bowel with mod Assistance.  Outcome: Progressing Goal: RH STG MANAGE BOWEL W/MEDICATION W/ASSISTANCE Description STG Manage Bowel with Medication with mod Assistance.  Outcome: Progressing   Problem: RH SKIN INTEGRITY Goal: RH STG SKIN FREE OF INFECTION/BREAKDOWN Description Skin to remain free from infection and breakdown while on rehab with min assist.  Outcome: Progressing Goal: RH STG MAINTAIN SKIN INTEGRITY WITH ASSISTANCE Description STG Maintain Skin Integrity With min Assistance.  Outcome: Progressing   Problem: RH SAFETY Goal: RH STG ADHERE TO SAFETY PRECAUTIONS W/ASSISTANCE/DEVICE Description STG Adhere to Safety Precautions With mod Assistance and use of appropriate assistive Device.  Outcome: Progressing   Problem: RH PAIN MANAGEMENT Goal: RH STG PAIN MANAGED AT OR BELOW PT'S PAIN GOAL Description <4 on a 1-10 pain scale  Outcome: Progressing   Problem: RH KNOWLEDGE DEFICIT Goal: RH STG INCREASE KNOWLEDGE OF HYPERTENSION Description Patient and caregiver will demonstrate knowledge of HTN medications, dietary restrictions, and BP parameters as set by the MD with min assist from rehab staff.  Outcome: Progressing Goal: RH STG INCREASE KNOWLEGDE OF HYPERLIPIDEMIA Description Patient and caregiver will demonstrate knowledge of HLD medications, and LDL, HDL, and triglycerides with min assist from rehab staff.  Outcome: Progressing Goal: RH STG INCREASE KNOWLEDGE OF STROKE PROPHYLAXIS Description Patient and caregiver will demonstrate knowledge of ASA and Plavix as used for stroke prophylaxis with min assist from rehab staff.   Outcome: Progressing   

## 2018-02-19 NOTE — Progress Notes (Signed)
Physical Therapy Discharge Summary  Patient Details  Name: Wayne Griffin MRN: [id number] Date of Birth: [birth year]  Patient has met 2 of 5 long term goals due to decreased participation, self-limiting behavior, pain, and decreased OOB tolerance.  Patient to discharge at a wheelchair level Supervision.   Patient's care partner unavailable to provide the necessary physical assistance at discharge, therefore pt w/ planned d/c to SNF.   Reasons goals not met: see note above  Recommendation:  Patient will benefit from ongoing skilled PT services in skilled nursing facility setting to continue to advance safe functional mobility, address ongoing impairments in functional strength, functional balance, postural control, and global endurance, and minimize fall risk.  Equipment: No equipment provided  Reasons for discharge: lack of progress toward goals and discharge from hospital  Patient/family agrees with progress made and goals achieved: Yes  PT Discharge Precautions/Restrictions Precautions Precautions: Fall Precaution Comments: L hemi Restrictions Weight Bearing Restrictions: No Vision/Perception  Perception Perception: Within Functional Limits Praxis Praxis: Intact  Cognition Overall Cognitive Status: Within Functional Limits for tasks assessed Arousal/Alertness: Awake/alert Orientation Level: Oriented X4 Divided Attention: Appears intact Memory: Appears intact Awareness: Appears intact Problem Solving: Appears intact Reasoning: Appears intact Self Monitoring: Appears intact Self Correcting: Appears intact Safety/Judgment: Appears intact Sensation Sensation Light Touch: Appears Intact Coordination Gross Motor Movements are Fluid and Coordinated: No Fine Motor Movements are Fluid and Coordinated: No Coordination and Movement Description: dense L hemi  Motor  Motor Motor: Hemiplegia Motor - Discharge Observations: L hemi, UE>LE  Mobility Bed Mobility Bed Mobility:  Rolling Right;Rolling Left;Supine to Sit;Sit to Supine Rolling Right: Moderate Assistance - Patient 50-74% Rolling Left: Moderate Assistance - Patient 50-74% Supine to Sit: Maximal Assistance - Patient - Patient 25-49% Sit to Supine: Maximal Assistance - Patient 25-49% Transfers Transfers: Stand to Sit;Sit to Stand;Stand Pivot Transfers Sit to Stand: Maximal Assistance - Patient 25-49% Stand to Sit: Maximal Assistance - Patient 25-49% Stand Pivot Transfers: Dependent - mechanical lift Transfer (Assistive device): Other (Comment)(stedy) Locomotion  Gait Ambulation: No Gait Gait: No Stairs / Additional Locomotion Stairs: No Wheelchair Mobility Wheelchair Mobility: Yes Wheelchair Assistance: Chartered loss adjuster: Right lower extremity;Right upper extremity Wheelchair Parts Management: Supervision/cueing Distance: 50'  Trunk/Postural Assessment  Cervical Assessment Cervical Assessment: Within Functional Limits Thoracic Assessment Thoracic Assessment: Within Functional Limits Lumbar Assessment Lumbar Assessment: Exceptions to WFL(posterior pelvic tilt) Postural Control Postural Control: Deficits on evaluation(delayed)  Balance Balance Balance Assessed: Yes Static Sitting Balance Static Sitting - Level of Assistance: 5: Stand by assistance Dynamic Sitting Balance Dynamic Sitting - Level of Assistance: 4: Min assist(CGA) Static Standing Balance Static Standing - Level of Assistance: 2: Max assist Extremity Assessment      RLE Assessment RLE Assessment: Within Functional Limits LLE Assessment LLE Assessment: Exceptions to [facility] General Strength Comments: globally 1 to 2-/5     Najiyah Paris K Yaret Hush 02/19/2018, 12:54 PM

## 2018-02-19 NOTE — Progress Notes (Signed)
[facility] Report given to Elmarie Shileyiffany, Charity fundraiserN at [facility]. Akio Hudnall W Jazmina Muhlenkamp

## 2018-02-19 NOTE — Progress Notes (Signed)
Pt discharged to [facility] via EMS. PRN pain med given prior to D/C. Pt stable at time of D/C. Belongings sent with pt. No further questions from pt or family.   Francie Keeling W [employer]

## 2018-02-19 NOTE — Progress Notes (Signed)
[place] PHYSICAL MEDICINE & REHABILITATION PROGRESS NOTE   Subjective/Complaints: Patient seen laying in bed this morning.  He states he slept well overnight.  He is flat about discharge today.  ROS: Denies CP, SOB, N/V/D  Objective:   No results found. No results for input(s): WBC, HGB, HCT, PLT in the last 72 hours. Recent Labs    02/18/18 0557  CREATININE 1.80*    Intake/Output Summary (Last 24 hours) at 02/19/2018 0846 Last data filed at 02/19/2018 0227 Gross per 24 hour  Intake 240 ml  Output 575 ml  Net -335 ml     Physical Exam: Vital Signs Blood pressure 128/70, pulse 65, temperature 97.8 F (36.6 C), temperature source Oral, resp. rate 15, height 5\' 11"  (1.803 m), weight 106.3 kg, SpO2 92 %. Constitutional: No distress . Vital signs reviewed. HENT: Normocephalic.  Atraumatic. Eyes: EOMI. No discharge. Cardiovascular: RRR.  No JVD. Respiratory: CTA bilaterally.  Normal effort.  GI: BS +. Non-distended. Musc: No edema or tenderness in extremities. Neurologic: Alert. Motor: 5/5 in RUE/RLE, unchanged 0/5 left upper extremity proximal distal, unchanged Left lower extremity: Hip flexion, knee extension 2/5, ankle dorsiflexion 1/5, unchanged Skin: No evidence of breakdown, no evidence of rash Psych: Flat  Assessment/Plan: 1. Functional deficits secondary to Left hemiplegia Right subcortical infarct which require 3+ hours per day of interdisciplinary therapy in a comprehensive inpatient rehab setting.  Physiatrist is providing close team supervision and 24 hour management of active medical problems listed below.  Physiatrist and rehab team continue to assess barriers to discharge/monitor patient progress toward functional and medical goals  Care Tool:  Bathing    Body parts bathed by patient: Left arm, Chest, Abdomen, Front perineal area, Right upper leg, Left upper leg, Face   Body parts bathed by helper: Right arm, Buttocks, Right lower leg, Left lower  leg     Bathing assist Assist Level: Moderate Assistance - Patient 50 - 74%     Upper Body Dressing/Undressing Upper body dressing   What is the patient wearing?: Pull over shirt    Upper body assist Assist Level: Moderate Assistance - Patient 50 - 74%    Lower Body Dressing/Undressing Lower body dressing      What is the patient wearing?: Incontinence brief, Pants     Lower body assist Assist for lower body dressing: Maximal Assistance - Patient 25 - 49%     Toileting Toileting    Toileting assist Assist for toileting: 2 Helpers     Transfers Chair/bed transfer  Transfers assist     Chair/bed transfer assist level: 2 Helpers     Locomotion Ambulation   Ambulation assist   Ambulation activity did not occur: Safety/medical concerns  Assist level: 2 helpers Assistive device: Other (comment)(rail) Max distance: 5 ft   Walk 10 feet activity   Assist  Walk 10 feet activity did not occur: Safety/medical concerns        Walk 50 feet activity   Assist Walk 50 feet with 2 turns activity did not occur: Safety/medical concerns         Walk 150 feet activity   Assist Walk 150 feet activity did not occur: Safety/medical concerns         Walk 10 feet on uneven surface  activity   Assist Walk 10 feet on uneven surfaces activity did not occur: Safety/medical concerns         Wheelchair     Assist Will patient use wheelchair at discharge?: Yes Type of Wheelchair:  Manual    Wheelchair assist level: Moderate Assistance - Patient 50 - 74% Max wheelchair distance: 50    Wheelchair 50 feet with 2 turns activity    Assist        Assist Level: Moderate Assistance - Patient 50 - 74%   Wheelchair 150 feet activity     Assist Wheelchair 150 feet activity did not occur: Safety/medical concerns   Assist Level: Moderate Assistance - Patient 50 - 74%    Medical Problem List and Plan: 1.  Left-sided weakness and dysarthria  secondary to right basal ganglia corona radiata infarct on 01/24/18 secondary to small vessel disease. Aspirin and Plavix therapy 3 weeks then aspirin alone, ? If speech difficulties prior to admission were a TIA  DC today to SNF  Patient to follow-up with MD for hospital follow 1 month post-discharge  WHO/PRAFO 2.  DVT Prophylaxis/Anticoagulation: subcutaneous Lovenox. Monitor for any bleeding episodes 3. Pain Management:  Tramadol as needed, added voltaren gel and Knee orthosis- improved pain,  mainly medial joint line  Changed tramadol to 50mg  q6 prn with zanaflex at noc 4. Mood: Ativan DC'd, Klonopin 0.25 3 times daily, remains very anxious nursing gets good results with nasal cannula without oxygen 5. Neuropsych: This patient is capable of making decisions onhis own behalf. 6. Skin/Wound Care: Routine skin checks 7. Fluids/Electrolytes/Nutrition:  Routine in and out's  8. Hypertension. Norvasc 10 mg daily. Monitor with increased mobility   Vitals:   02/18/18 2209 02/19/18 0517  BP: (!) 157/90 128/70  Pulse:  65  Resp:  15  Temp:  97.8 F (36.6 C)  SpO2:  92%   No improvement with HCTZ, hydralazine 10mg  TID, avoid ACE and Diuretic due to elevated creat, HR low 60s avoid BB  Labile on 12/31 9. Hyperlipidemia. Lipitor 10. History of tobacco use. Provide counseling  11.  Borderline normocytic anemia: Resolved  Hemoglobin 13.0 on 12/27  Continue to monitor 12.  Hematuria:?  Resolved 13.  AKI  Creatinine 1.82 on 12/30  Continue to monitor 14.  Urinary frequency  PVRs relatively low volumes  LOS: 22 days A FACE TO FACE EVALUATION WAS PERFORMED   Karis Jubanil  02/19/2018, 8:46 AM

## 2018-02-21 ENCOUNTER — Encounter: Payer: Self-pay | Admitting: Registered Nurse

## 2018-02-21 ENCOUNTER — Telehealth: Payer: Self-pay

## 2018-02-21 NOTE — Telephone Encounter (Signed)
error 

## 2018-02-22 ENCOUNTER — Encounter: Payer: Self-pay | Admitting: Physical Medicine & Rehabilitation

## 2018-02-26 ENCOUNTER — Encounter: Payer: Medicare Other | Admitting: Registered Nurse

## 2018-03-15 ENCOUNTER — Inpatient Hospital Stay: Payer: Medicare Other | Admitting: Physical Medicine & Rehabilitation

## 2018-03-26 ENCOUNTER — Inpatient Hospital Stay: Payer: Medicare Other | Admitting: Physical Medicine & Rehabilitation

## 2018-04-05 ENCOUNTER — Inpatient Hospital Stay: Payer: Self-pay | Admitting: Adult Health

## 2018-04-15 ENCOUNTER — Other Ambulatory Visit: Payer: Self-pay

## 2018-04-15 NOTE — Patient Outreach (Signed)
First attempt to obtain mRs. No answer. Left message for return call.  

## 2018-04-26 ENCOUNTER — Other Ambulatory Visit: Payer: Self-pay

## 2018-04-26 NOTE — Patient Outreach (Signed)
Second attempt to obtain mRs. No answer. Left message for return call.  

## 2018-05-03 ENCOUNTER — Other Ambulatory Visit: Payer: Self-pay

## 2018-05-03 NOTE — Patient Outreach (Signed)
3 outreach attempts were completed to obtain mRs. mRs could not be obtained because patient never returned my calls. MRs=7  No DPR on file, other contact had same phone number.

## 2019-05-02 ENCOUNTER — Other Ambulatory Visit: Payer: Self-pay

## 2019-05-02 ENCOUNTER — Non-Acute Institutional Stay: Payer: Medicare Other | Admitting: Nurse Practitioner

## 2019-05-02 ENCOUNTER — Encounter: Payer: Self-pay | Admitting: Nurse Practitioner

## 2019-05-02 VITALS — BP 110/70 | HR 71 | Temp 97.9°F | Resp 18 | Wt 205.8 lb

## 2019-05-02 DIAGNOSIS — Z515 Encounter for palliative care: Secondary | ICD-10-CM

## 2019-05-02 DIAGNOSIS — I639 Cerebral infarction, unspecified: Secondary | ICD-10-CM

## 2019-05-02 NOTE — Progress Notes (Signed)
[facility] Palliative Care Consult Note Telephone: [phone]  Fax: [phone]  PATIENT NAME: Wayne Griffin DOB: Jan 07, 1946 MRN: [id number]  PRIMARY CARE PROVIDER:  Dr Hodges/[facility]  RESPONSIBLE PARTY:   Wayne Griffin son [phone]  I was asked by Dr Nyra Capes to see Wayne Griffin for Palliative care consult for goals of care  RECOMMENDATIONS and PLAN:  1. ACP: DNR/MOST completed; placed in Epic/Vynca; medical goc discussion pending with son   2. Palliative care encounter; Palliative medicine team will continue to support patient, patient's family, and medical team. Visit consisted of counseling and education dealing with the complex and emotionally intense issues of symptom management and palliative care in the setting of serious and potentially life-threatening illness  I spent 65 minutes providing this consultation,  from 12:10pm to 1:15pm. More than 50% of the time in this consultation was spent coordinating communication.   HISTORY OF PRESENT ILLNESS:  Wayne Griffin is a 74 y.o. year old male with multiple medical problems including CVA, hemiplegia, dysphasia, obesity, hypertension, chronic kidney disease, hyperlipidemia how much chronic pain, gerd, anxiety, depression, tobacco abuse. Wayne Griffin resides at [place] at [facility]. Wayne Griffin is an extensive assistance for transferring, mobility. Wayne Griffin does stood up in the wheelchair. Wayne Griffin is not ambulatory. Wayne Griffin does require total ADL assistance including dressing, toileting. Wayne Griffin is able to feed himself what the fair appetite on regular diet regular texture and regular liquid consistency. Currently Wayne Griffin weight is 205.8 lb with BMI 28.7. My primary provider visit by [facility] nurse practitioner 2 / 5 / 2021 for follow up chronic renal failure stage 3. Medical goals to focus on DNR, no feeding tube but wishes are  for IV fluids, antibiotics, blood transfusion, diagnostic testing, hospitalization, lab testing. Wayne. Griffin continues to reside at [place] at [facility]. Wayne Griffin is bed bound, requires total assistance for transferring, mobility, adl's and toileting. Wayne Griffin does feed himself. Staff endorses Wayne Griffin appetite has been declined depending on what is being served. Staff endorses Wayne Griffin is able to verbalize his needs. At present Wayne. Griffin is lying in bed, appears chronically ill debilitated but comfortable. No visitors present. I visited and observe Wayne Griffin. We talked about purpose for palliative care visit. Wayne Griffin in agreement. We talked about how he is feeling today. Wayne Griffin endorses that he is tired. Wayne Griffin deny symptoms of pain and shortness of breath. We talked about his functional abilities. Wayne. Griffin endorses that he really gets out of bed and prefers to stay laying down. We talked about his daily routine. We talked about medical goals of care. We talked about residing at [facility] with loss of [place], relying on others for needs to be met. Talked about coping strategies. We talked about medical goals as Wayne. Griffin is a do not resuscitate. We talked about role of palliative care and plan of care. Encouraged Wayne. Griffin to get out of bed which he politely declined. We talked about follow up palliative care visit in one week if needed or sooner should he declined. Wayne Griffin in agreement. I have attempted to contact his son for clinical update of palliative care visit in further discussion of medical goals of care. Therapeutic listening and emotional support provided. Contact information. Questions answered. Updated nursing staff no new changes at present time to goals or plan of care. Palliative Care  was asked to help address goals of care.   CODE STATUS: DNR  PPS: 30% HOSPICE ELIGIBILITY/DIAGNOSIS: TBD  SOCIAL  HX:  Social History   Tobacco Use  . Smoking status: Former Research scientist (life sciences)  . Smokeless tobacco: Never Used  Substance Use Topics  . Alcohol use: Not Currently    ALLERGIES: No Known Allergies   PERTINENT MEDICATIONS:  Outpatient Encounter Medications as of 05/02/2019  Medication Sig  . acetaminophen (TYLENOL) 325 MG tablet Take 2 tablets (650 mg total) by mouth every 4 (four) hours as needed for mild pain (or temp > 37.5 C (99.5 F)).  Marland Kitchen amLODipine (NORVASC) 10 MG tablet Take 1 tablet (10 mg total) by mouth daily.  Marland Kitchen aspirin EC 81 MG EC tablet Take 1 tablet (81 mg total) by mouth daily.  Marland Kitchen atorvastatin (LIPITOR) 40 MG tablet Take 1 tablet (40 mg total) by mouth daily at 6 PM.  . citalopram (CELEXA) 10 MG tablet Take 1 tablet (10 mg total) by mouth daily.  . clonazePAM (KLONOPIN) 0.25 MG disintegrating tablet Take 1 tablet (0.25 mg total) by mouth 3 (three) times daily.  . clopidogrel (PLAVIX) 75 MG tablet Take 1 tablet (75 mg total) by mouth daily.  . diclofenac sodium (VOLTAREN) 1 % GEL Apply 2 g topically 4 (four) times daily.  . hydrALAZINE (APRESOLINE) 10 MG tablet Take 1 tablet (10 mg total) by mouth every 8 (eight) hours.  . pantoprazole (PROTONIX) 40 MG tablet Take 1 tablet (40 mg total) by mouth daily.  Marland Kitchen senna-docusate (SENOKOT-S) 8.6-50 MG tablet Take 2 tablets by mouth 2 (two) times daily.  Marland Kitchen tiZANidine (ZANAFLEX) 2 MG tablet Take 1 tablet (2 mg total) by mouth at bedtime.  . traMADol (ULTRAM) 50 MG tablet Take 1 tablet (50 mg total) by mouth every 6 (six) hours as needed for moderate pain.   No facility-administered encounter medications on file as of 05/02/2019.    PHYSICAL EXAM:   General: NAD, debilitated, chronically ill male Cardiovascular: regular rate and rhythm Pulmonary: clear ant fields Abdomen: soft, nontender, + bowel sounds, obese Extremities: mild edema, no joint deformities Neurological: non-ambulatory  Wayne Griffin Z Wayne Skalla, NP

## 2019-05-14 ENCOUNTER — Other Ambulatory Visit: Payer: Self-pay

## 2019-05-14 ENCOUNTER — Encounter: Payer: Self-pay | Admitting: Nurse Practitioner

## 2019-05-14 ENCOUNTER — Non-Acute Institutional Stay: Payer: Medicare Other | Admitting: Nurse Practitioner

## 2019-05-14 DIAGNOSIS — I639 Cerebral infarction, unspecified: Secondary | ICD-10-CM

## 2019-05-14 DIAGNOSIS — Z515 Encounter for palliative care: Secondary | ICD-10-CM

## 2019-05-14 NOTE — Progress Notes (Signed)
[place] Consult Note Telephone: [phone]  Fax: [phone]  PATIENT NAME: Wayne Griffin DOB: 03-12-1945 MRN: [id number] PRIMARY CARE PROVIDER:  Dr Hodges/[facility]  RESPONSIBLE PARTY:   Wayne Griffin son [phone]  RECOMMENDATIONS and PLAN: 1.ACP: DNR/MOST in Epic/Vynca; medical goc discussion pending with son  2.Palliative care encounter; Palliative medicine team will continue to support patient, patient's family, and medical team. Visit consisted of counseling and education dealing with the complex and emotionally intense issues of symptom management and palliative care in the setting of serious and potentially life-threatening illness  I spent 35 minutes providing this consultation,  from 10:35am to 11:10am. More than 50% of the time in this consultation was spent coordinating communication.   HISTORY OF PRESENT ILLNESS:  Wayne Griffin is a 74 y.o. year old male with multiple medical problems including CVA, hemiplegia, dysphasia, obesity, hypertension, chronic kidney disease, hyperlipidemia how much chronic pain, gerd, anxiety, depression, tobacco abuse. Wayne Griffin continues to reside at [place] at [facility]. Wayne Griffin remains bed bound, requires assistance from staff for turning and positioning. Wayne Griffin is total ADL dependent including toileting. Wayne Griffin does feed himself after tray setup. Appetite remains fair. Staff endorses Wayne Griffin declines to get out of bed. No recent Falls. At present Wayne Griffin is lying in bed, he appears to debilitated, chronically ill, pale but comfortable. I visited an observed Wayne Griffin. We talked about purpose of palliative care visit. Wayne Griffin in agreement. We talked about how he is feeling today. Wayne Griffin endorses that he is doing okay. We talked about importance of mobility and getting out of bed. Wayne Griffin  endorses that he does not want to be sitting up he is comfortable in bed. We talked about residing at facility, loss of [place] and relying on others for basic needs. We talked about chronic disease. We talked about symptoms of pain and shortness of breath when she denies. We talked about his appetite. We talked about foods that he likes. We talked about medical goals of care. We talked about role of palliative care and plan of care. We talked about follow-up palliative care visit in 4 weeks if needed or sooner should decline. Wayne Griffin in agreement. I updated nursing staff. I have attempted to contact Wayne Griffin son for update on palliative care visit, message left with contact information  Palliative Care was asked to help address goals of care.   CODE STATUS: DNR  PPS: 30% HOSPICE ELIGIBILITY/DIAGNOSIS: TBD  SOCIAL HX:  Social History   Tobacco Use  . Smoking status: Former Research scientist (life sciences)  . Smokeless tobacco: Never Used  Substance Use Topics  . Alcohol use: Not Currently    ALLERGIES: No Known Allergies   PERTINENT MEDICATIONS:  Outpatient Encounter Medications as of 05/14/2019  Medication Sig  . acetaminophen (TYLENOL) 325 MG tablet Take 2 tablets (650 mg total) by mouth every 4 (four) hours as needed for mild pain (or temp > 37.5 C (99.5 F)).  Marland Kitchen amLODipine (NORVASC) 10 MG tablet Take 1 tablet (10 mg total) by mouth daily.  Marland Kitchen aspirin EC 81 MG EC tablet Take 1 tablet (81 mg total) by mouth daily.  Marland Kitchen atorvastatin (LIPITOR) 40 MG tablet Take 1 tablet (40 mg total) by mouth daily at 6 PM.  . citalopram (CELEXA) 10 MG tablet Take 1 tablet (10 mg total) by mouth daily.  . clonazePAM (KLONOPIN) 0.25 MG disintegrating tablet Take 1  tablet (0.25 mg total) by mouth 3 (three) times daily.  . clopidogrel (PLAVIX) 75 MG tablet Take 1 tablet (75 mg total) by mouth daily.  . diclofenac sodium (VOLTAREN) 1 % GEL Apply 2 g topically 4 (four) times daily.  . hydrALAZINE (APRESOLINE) 10 MG tablet  Take 1 tablet (10 mg total) by mouth every 8 (eight) hours.  . pantoprazole (PROTONIX) 40 MG tablet Take 1 tablet (40 mg total) by mouth daily.  Marland Kitchen senna-docusate (SENOKOT-S) 8.6-50 MG tablet Take 2 tablets by mouth 2 (two) times daily.  Marland Kitchen tiZANidine (ZANAFLEX) 2 MG tablet Take 1 tablet (2 mg total) by mouth at bedtime.  . traMADol (ULTRAM) 50 MG tablet Take 1 tablet (50 mg total) by mouth every 6 (six) hours as needed for moderate pain.   No facility-administered encounter medications on file as of 05/14/2019.    PHYSICAL EXAM:   General: obese, debilitated, pleasant male Cardiovascular: regular rate and rhythm Pulmonary: clear ant fields Abdomen: soft, nontender, + bowel sounds Extremities:  edema, no joint deformities Neurological: bedbound/non-ambulatory Sury Wentworth Prince Rome, NP

## 2019-08-27 IMAGING — DX DG HIP (WITH OR WITHOUT PELVIS) 2-3V*L*
3 series · 3 of 3 positions shown · non-contrast
Comparison: Coronal and sagittal reconstructed images through the
pelvis and hips from an abdominal CT scan January 06, 2008

CLINICAL DATA: Multiple recent falls due to her recent stroke.
Generalize left lower extremity pain. No visible bruising.

EXAM:
DG HIP (WITH OR WITHOUT PELVIS) 2-3V LEFT

[pelvis ap]
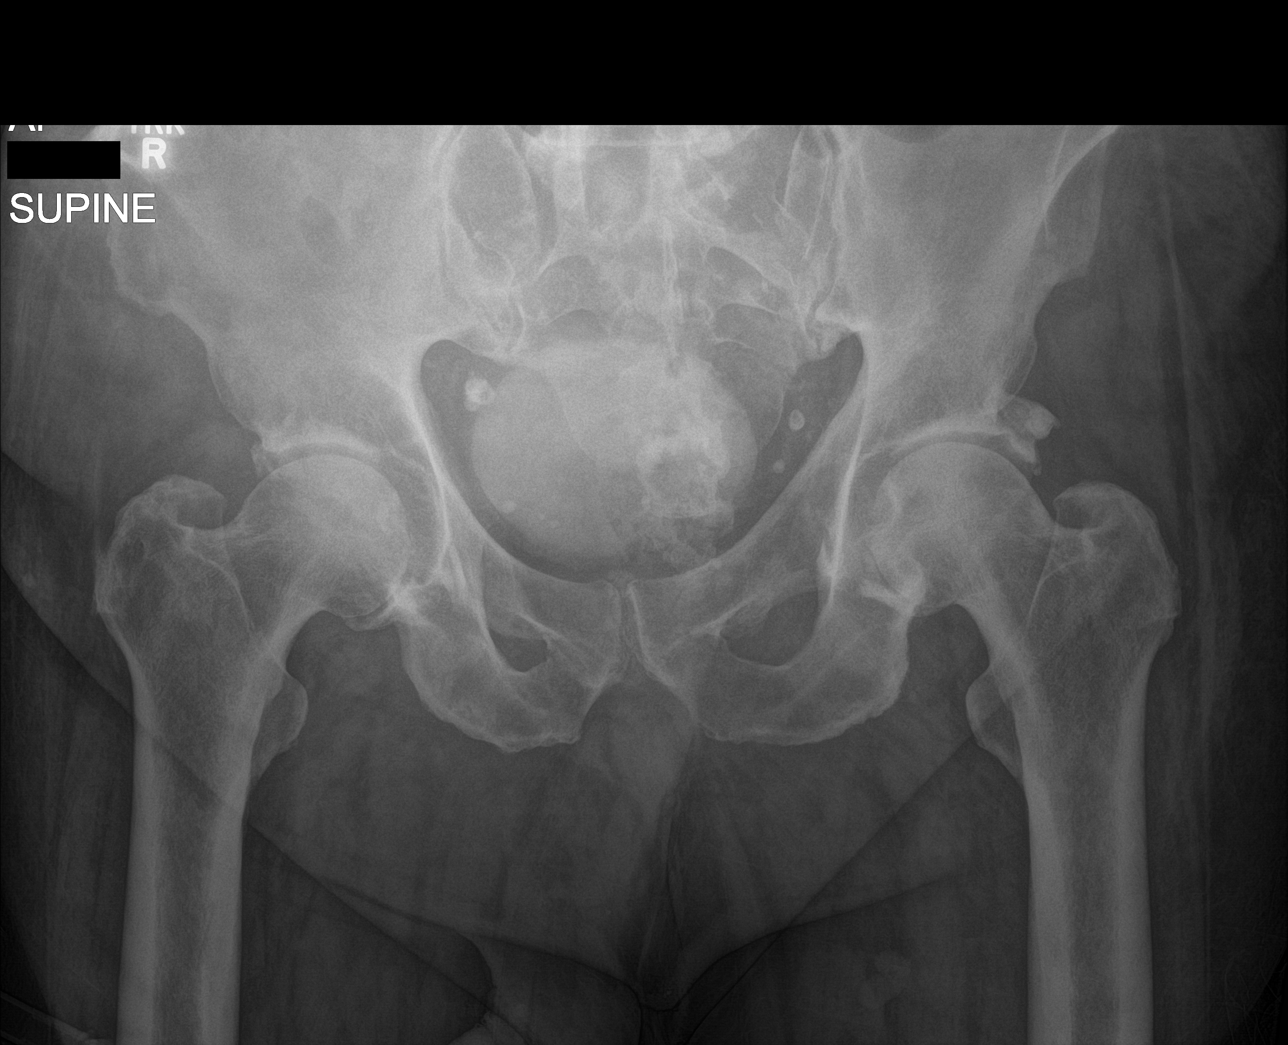

[hip ap]
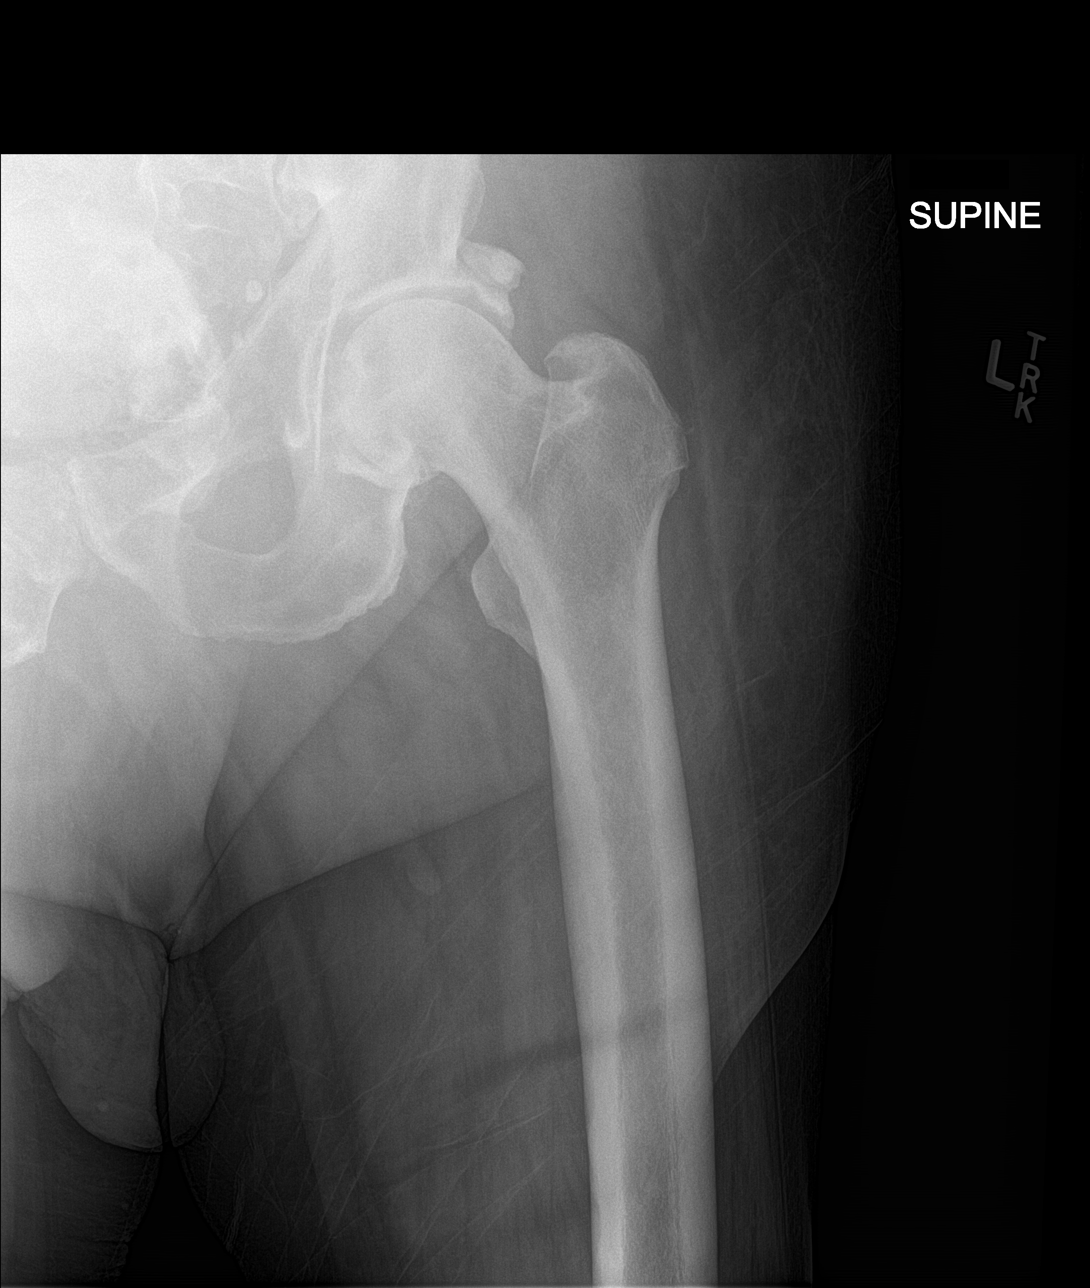

[hip lat]
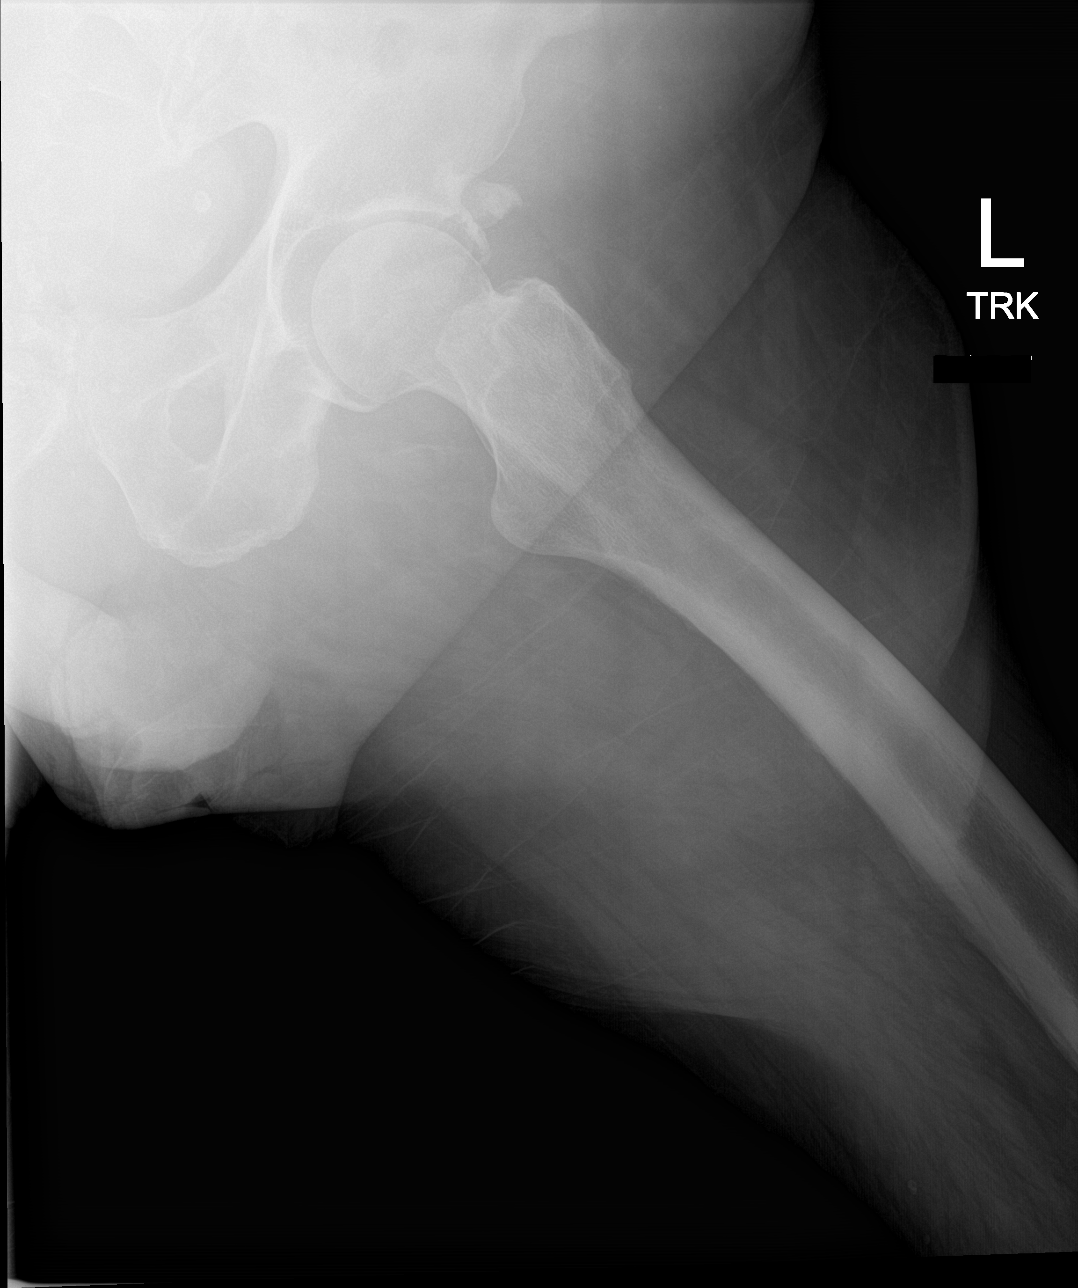

[3 of 3 positions shown; findings below may reference images not displayed]

FINDINGS: The observed portions of the bony pelvis are subjectively adequately
mineralized. There is no lytic or blastic lesion. There is contrast
within the urinary bladder. There multiple pelvic phleboliths. AP
and lateral views of the left hip reveal mild symmetric narrowing of
the left hip joint space. Small spurs arise from the articular
margins of the acetabulum. The femoral head, neck,
intertrochanteric, and immediate subtrochanteric regions are normal.
IMPRESSION: There is no acute bony abnormality of the left hip or visualized
portions of the pelvis. Mild symmetric narrowing of the left hip
joint space.

## 2019-09-03 ENCOUNTER — Non-Acute Institutional Stay: Payer: Medicare Other | Admitting: Nurse Practitioner

## 2019-09-03 ENCOUNTER — Other Ambulatory Visit: Payer: Self-pay

## 2019-09-03 VITALS — BP 138/67 | HR 80 | Temp 96.2°F | Resp 18 | Wt 205.8 lb

## 2019-09-03 DIAGNOSIS — Z515 Encounter for palliative care: Secondary | ICD-10-CM

## 2019-09-03 DIAGNOSIS — I639 Cerebral infarction, unspecified: Secondary | ICD-10-CM

## 2019-09-03 NOTE — Progress Notes (Signed)
Therapist, nutritional Palliative Care Consult Note Telephone: [phone]  Fax: [phone]  PATIENT NAME: Wayne Griffin DOB: May 30, 1945 MRN: [id number]  PRIMARY CARE PROVIDER:Dr Hodges/[facility]  RESPONSIBLE PARTY:Wayne Griffin son [phone]  RECOMMENDATIONS and PLAN: 1.ACP:DNR/MOST in Epic/Vynca;  2.Palliative care encounter; Palliative medicine team will continue to support patient, patient's family, and medical team. Visit consisted of counseling and education dealing with the complex and emotionally intense issues of symptom management and palliative care in the setting of serious and potentially life-threatening illness  I spent 55 minutes providing this consultation,  sttart at 11:00am. More than 50% of the time in this consultation was spent coordinating communication.   HISTORY OF PRESENT ILLNESS:  Wayne Griffin is a 74 y.o. year old male with multiple medical problems including  CVA, hemiplegia, dysphasia, obesity, hypertension, chronic kidney disease, hyperlipidemia how much chronic pain, gerd, anxiety, depression, tobacco abuse. Wayne Griffin continues to reside in [facility]. Wayne Griffin remains been found for his choice. Wayne Griffin does require total ADL, toileting. Wayne Griffin does feed himself that requires tray setup. Most current weight relatively stable with BMI 28.7. No recent wounds, falls, infections, hospitalizations. Last Primary Provider visit from [facility] nurse practitioner 6 / [phone] / 2021 for UTI with hematuria. Antibiotics completed. Medical goals of care focus on DNR, do not hospitalized, do not intubate, no feeding tube but wishes are for antibiotics, IV fluids, blood transfusions, diagnostic testing, lab testing. Staff endorses concerns for Wayne Griffin becoming more sad. Wayne. Griffin does sleep most of the time. Wayne. Griffin interaction with staff has decreased. His son  does come to see him but it has been some time  due to limited visiting hours, covid. Staff endorses they do tried to get him out of bed but he declines. No recent falls, ones, hospitalizations, infections. At present Wayne Griffin is lying in bed, appears debilitated but comfortable. No visitors present. I visited and observed Wayne Griffin. We talked about purpose of palliative care visit. Wayne Griffin in agreement. We talked about symptoms of pain and shortness of breath which he denies. We talked about his appetite which has been fair. Food does not always tastes as good. We talked about mobility. Wayne Griffin endorses he prefers not to get out of bed. 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 We talked about his role in his job that made him very social. We talked about the changes that have occurred over the years since retirement including his health. We talked about the frustration of his body not doing what his mind wants it to do. We talked about Wayne Breeze being sad. Wayne Griffin endorses that it is very hard and he does not feel like doing anything but sleep. Wayne Griffin denies SI/HI. Wayne Griffin endorses overall he is just very tired. We talked about his son. We talked about visiting hours opening up. We talked about the importance of getting out of bed which Wayne Griffin declined. We talked about becoming more social, interactive. We talked about option of psychiatric consult in addition to talk therapy. Wayne Griffin in agreement. We talked about medication review. We talked about medical goals of care is he is his own responsible party. We talked about quality-of-life. We talked about coping strategies. We talked about challenges with social interactions, long-term care facility, barriers. We talked about role of palliative care in plan of care. Discuss that will  follow up in 4 weeks once Psychiatry and talk therapy is initiated. Discuss that will encourage Wayne Griffin son to visit as often  as able. Encourage Wayne Griffin to get out of bed and positive reinforcement will ask staff to do the same. I have attempted to contact his son Wayne, message left to return call with contact information. I updated nursing staff and [facility] NP.  Palliative Care was asked to help to continue to address goals of care.   CODE STATUS: DNR  PPS: 30% HOSPICE ELIGIBILITY/DIAGNOSIS: TBD  PAST MEDICAL HISTORY: No past medical history on file.  SOCIAL HX:  Social History   Tobacco Use   Smoking status: Former Smoker   Smokeless tobacco: Never Used  Substance Use Topics   Alcohol use: Not Currently    ALLERGIES: No Known Allergies   PERTINENT MEDICATIONS:  Outpatient Encounter Medications as of 74/14/2021  Medication Sig   acetaminophen (TYLENOL) 325 MG tablet Take 2 tablets (650 mg total) by mouth every 4 (four) hours as needed for mild pain (or temp > 37.5 C (99.5 F)).   amLODipine (NORVASC) 10 MG tablet Take 1 tablet (10 mg total) by mouth daily.   aspirin EC 81 MG EC tablet Take 1 tablet (81 mg total) by mouth daily.   atorvastatin (LIPITOR) 40 MG tablet Take 1 tablet (40 mg total) by mouth daily at 6 PM.   citalopram (CELEXA) 10 MG tablet Take 1 tablet (10 mg total) by mouth daily.   clonazePAM (KLONOPIN) 0.25 MG disintegrating tablet Take 1 tablet (0.25 mg total) by mouth 3 (three) times daily.   clopidogrel (PLAVIX) 75 MG tablet Take 1 tablet (75 mg total) by mouth daily.   diclofenac sodium (VOLTAREN) 1 % GEL Apply 2 g topically 4 (four) times daily.   hydrALAZINE (APRESOLINE) 10 MG tablet Take 1 tablet (10 mg total) by mouth every 8 (eight) hours.   pantoprazole (PROTONIX) 40 MG tablet Take 1 tablet (40 mg total) by mouth daily.   senna-docusate (SENOKOT-S) 8.6-50 MG tablet Take 2 tablets by mouth 2 (two) times daily.   tiZANidine (ZANAFLEX) 2 MG tablet Take 1 tablet (2 mg total) by mouth at bedtime.   traMADol (ULTRAM) 50 MG tablet Take 1 tablet (50 mg total) by mouth  every 6 (six) hours as needed for moderate pain.   No facility-administered encounter medications on file as of 09/03/2019.    PHYSICAL EXAM:   General: debilitated, pleasant male Cardiovascular: regular rate and rhythm Pulmonary: clear ant fields Abdomen: soft, nontender, + bowel sounds Neurological: functionally quadriplegic  Ellesse Antenucci Prince Rome, NP

## 2019-09-21 DEATH — deceased
